# Patient Record
Sex: Male | Born: 1960
Health system: Southern US, Community
[De-identification: ages and names within clinical notes are randomized; demographics above are authoritative.]

## PROBLEM LIST (undated history)

## (undated) DIAGNOSIS — K589 Irritable bowel syndrome without diarrhea: Secondary | ICD-10-CM

## (undated) DIAGNOSIS — K2900 Acute gastritis without bleeding: Secondary | ICD-10-CM

## (undated) DIAGNOSIS — K529 Noninfective gastroenteritis and colitis, unspecified: Secondary | ICD-10-CM

## (undated) DIAGNOSIS — C4490 Unspecified malignant neoplasm of skin, unspecified: Secondary | ICD-10-CM

## (undated) DIAGNOSIS — K219 Gastro-esophageal reflux disease without esophagitis: Secondary | ICD-10-CM

## (undated) DIAGNOSIS — M1990 Unspecified osteoarthritis, unspecified site: Secondary | ICD-10-CM

## (undated) DIAGNOSIS — B1910 Unspecified viral hepatitis B without hepatic coma: Secondary | ICD-10-CM

## (undated) HISTORY — DX: Noninfective gastroenteritis and colitis, unspecified: K52.9

## (undated) HISTORY — DX: Acute gastritis without bleeding: K29.00

## (undated) HISTORY — DX: Irritable bowel syndrome without diarrhea: K58.9

---

## 1987-11-08 DIAGNOSIS — B1910 Unspecified viral hepatitis B without hepatic coma: Secondary | ICD-10-CM

## 1987-11-08 HISTORY — DX: Unspecified viral hepatitis B without hepatic coma: B19.10

## 2011-02-15 HISTORY — PX: COLONOSCOPY: SHX174

## 2011-03-24 ENCOUNTER — Ambulatory Visit: Payer: Self-pay | Admitting: Gastroenterology

## 2011-04-12 ENCOUNTER — Ambulatory Visit: Payer: Self-pay | Admitting: Gastroenterology

## 2011-07-10 ENCOUNTER — Ambulatory Visit: Payer: Self-pay

## 2013-07-31 DIAGNOSIS — K529 Noninfective gastroenteritis and colitis, unspecified: Secondary | ICD-10-CM

## 2013-07-31 HISTORY — DX: Noninfective gastroenteritis and colitis, unspecified: K52.9

## 2013-09-19 DIAGNOSIS — K589 Irritable bowel syndrome without diarrhea: Secondary | ICD-10-CM

## 2013-09-19 HISTORY — DX: Irritable bowel syndrome, unspecified: K58.9

## 2015-02-02 ENCOUNTER — Other Ambulatory Visit: Payer: Self-pay | Admitting: Gastroenterology

## 2015-02-02 DIAGNOSIS — R197 Diarrhea, unspecified: Secondary | ICD-10-CM

## 2015-02-06 ENCOUNTER — Other Ambulatory Visit: Payer: Self-pay

## 2015-02-10 ENCOUNTER — Ambulatory Visit
Admission: RE | Admit: 2015-02-10 | Discharge: 2015-02-10 | Disposition: A | Discharge status: Home / Self Care | Payer: BLUE CROSS/BLUE SHIELD | Source: Ambulatory Visit | Attending: Gastroenterology | Admitting: Gastroenterology

## 2015-02-10 DIAGNOSIS — R197 Diarrhea, unspecified: Secondary | ICD-10-CM

## 2015-07-08 ENCOUNTER — Other Ambulatory Visit: Payer: Self-pay

## 2015-07-09 ENCOUNTER — Ambulatory Visit (INDEPENDENT_AMBULATORY_CARE_PROVIDER_SITE_OTHER): Payer: BLUE CROSS/BLUE SHIELD | Admitting: Gastroenterology

## 2015-07-09 ENCOUNTER — Encounter: Payer: Self-pay | Admitting: Gastroenterology

## 2015-07-09 VITALS — BP 137/88 | HR 78 | Temp 98.0°F | Ht 71.0 in | Wt 266.0 lb

## 2015-07-09 DIAGNOSIS — B1910 Unspecified viral hepatitis B without hepatic coma: Secondary | ICD-10-CM | POA: Insufficient documentation

## 2015-07-09 DIAGNOSIS — B181 Chronic viral hepatitis B without delta-agent: Secondary | ICD-10-CM | POA: Diagnosis not present

## 2015-07-09 NOTE — Progress Notes (Signed)
Gastroenterology Consultation  Referring Provider:     No ref. provider found Primary Care Physician:  No PCP Per Patient Primary Gastroenterologist:  Dr. Allen Norris     Reason for Consultation:     Irritable bowel syndrome        HPI:   Derrick Galvan. is a 54 y.o. y/o male referred for consultation & management of irritable bowel syndrome by Dr. Rayne Du PCP Per Patient.  This patient comes today with a history of irritable bowel syndrome. The patient has had multiple procedures and has seen multiple gastrologist including Dr. Candace Cruise and someone Butner. The patient reports that he has not been given information and is frustrated with his diarrhea. The patient reports that he has 4 bowel movements in the morning before evening goes to work. The patient is afraid to go out a restaurant due to his diarrhea. The patient states that when he gets the urge to go he has to run to the bathroom. He reports always to be loose. The patient was given some samples of Viberzi but had not taken it because he read some of the side effects and was concerned about that and wants to talk to me before taking the medication. The patient denies any bright red blood per rectum or nausea vomiting. He does report that he very infrequently has to wake up and melanotic because of his diarrhea. He does not notice any foods to make the diarrhea any better or worse. He also reports that this has been going on for many years.  Past Medical History  Diagnosis Date  . Chronic diarrhea 07/31/2013  . Adaptive colitis 09/19/2013    Past Surgical History  Procedure Laterality Date  . Colonoscopy  02/15/11    repeat 10 years    Prior to Admission medications   Medication Sig Start Date End Date Taking? Authorizing Provider  bismuth subsalicylate (BISMATROL) 262 MG/15ML suspension Take by mouth.   Yes Historical Provider, MD  loperamide (IMODIUM) 2 MG capsule Take by mouth.   Yes Historical Provider, MD    Family History  Problem  Relation Age of Onset  . COPD Father      Social History  Substance Use Topics  . Smoking status: Never Smoker   . Smokeless tobacco: Never Used  . Alcohol Use: 0.0 oz/week    0 Standard drinks or equivalent per week     Comment: occasional    Allergies as of 07/09/2015  . (No Known Allergies)    Review of Systems:    All systems reviewed and negative except where noted in HPI.   Physical Exam:  BP 137/88 mmHg  Pulse 78  Temp(Src) 98 F (36.7 C) (Oral)  Ht 5\' 11"  (1.803 m)  Wt 266 lb (120.657 kg)  BMI 37.12 kg/m2 No LMP for male patient. Psych:  Alert and cooperative. Normal mood and affect. General:   Alert,  Well-developed, obese, well-nourished, pleasant and cooperative in NAD Head:  Normocephalic and atraumatic. Eyes:  Sclera clear, no icterus.   Conjunctiva pink. Ears:  Normal auditory acuity. Nose:  No deformity, discharge, or lesions. Mouth:  No deformity or lesions,oropharynx pink & moist. Neck:  Supple; no masses or thyromegaly. Lungs:  Respirations even and unlabored.  Clear throughout to auscultation.   No wheezes, crackles, or rhonchi. No acute distress. Heart:  Regular rate and rhythm; no murmurs, clicks, rubs, or gallops. Abdomen:  Normal bowel sounds.  No bruits.  Soft, non-tender and non-distended without masses, hepatosplenomegaly  or hernias noted.  No guarding or rebound tenderness.  Negative Carnett sign.   Rectal:  Deferred.  Msk:  Symmetrical without gross deformities.  Good, equal movement & strength bilaterally. Pulses:  Normal pulses noted. Extremities:  No clubbing or edema.  No cyanosis. Neurologic:  Alert and oriented x3;  grossly normal neurologically. Skin:  Intact without significant lesions or rashes.  No jaundice. Lymph Nodes:  No significant cervical adenopathy. Psych:  Alert and cooperative. Normal mood and affect.  Imaging Studies: No results found.  Assessment and Plan:   Derrick Galvan. is a 54 y.o. y/o male who comes in today  with a history of loose stools and he reports that he has had 2 colonoscopies in the past that did not show any cause for his symptoms. The patient has been explained in depth the physiology of irritable bowel syndrome. He also has been told to stop the Viberzi and see if that helps him. If it does not he may need to start on Lomotil versus a try cyclic antidepressive versus Lotronex. Patient has been explained the plan and agrees with it.   Note: This dictation was prepared with Dragon dictation along with smaller phrase technology. Any transcriptional errors that result from this process are unintentional.

## 2015-11-08 DIAGNOSIS — C439 Malignant melanoma of skin, unspecified: Secondary | ICD-10-CM | POA: Insufficient documentation

## 2015-12-10 ENCOUNTER — Encounter: Payer: Self-pay | Admitting: Gastroenterology

## 2015-12-10 ENCOUNTER — Ambulatory Visit (INDEPENDENT_AMBULATORY_CARE_PROVIDER_SITE_OTHER): Payer: BLUE CROSS/BLUE SHIELD | Admitting: Gastroenterology

## 2015-12-10 VITALS — BP 122/78 | HR 77 | Temp 98.1°F | Ht 71.0 in | Wt 257.0 lb

## 2015-12-10 DIAGNOSIS — K589 Irritable bowel syndrome without diarrhea: Secondary | ICD-10-CM | POA: Diagnosis not present

## 2015-12-10 NOTE — Progress Notes (Signed)
   Primary Care Physician: No PCP Per Patient  Primary Gastroenterologist:  Dr. Lucilla Lame  Chief Complaint  Patient presents with  . Diarrhea    HPI: Derrick Lana. is a 55 y.o. male here for follow-up of his irritable bowel syndrome. The patient reports that he continues to have diarrhea and is now spilling over to his every day activities. The patient states that he did not try any the medications we have talked about in the past such as his samples of Vibrozi. There is no report of any unexplained weight loss. The patient reports that he has the sound of fluid moving around in his abdomen. He also reports that he has abdominal cramping. There is no report of any black stools or bloody stools.  Current Outpatient Prescriptions  Medication Sig Dispense Refill  . bismuth subsalicylate (BISMATROL) 262 MG/15ML suspension Take by mouth.    . loperamide (IMODIUM) 2 MG capsule Take by mouth.     No current facility-administered medications for this visit.    Allergies as of 12/10/2015  . (No Known Allergies)    ROS:  General: Negative for anorexia, weight loss, fever, chills, fatigue, weakness. ENT: Negative for hoarseness, difficulty swallowing , nasal congestion. CV: Negative for chest pain, angina, palpitations, dyspnea on exertion, peripheral edema.  Respiratory: Negative for dyspnea at rest, dyspnea on exertion, cough, sputum, wheezing.  GI: See history of present illness. GU:  Negative for dysuria, hematuria, urinary incontinence, urinary frequency, nocturnal urination.  Endo: Negative for unusual weight change.    Physical Examination:   BP 122/78 mmHg  Pulse 77  Temp(Src) 98.1 F (36.7 C) (Oral)  Ht 5\' 11"  (1.803 m)  Wt 257 lb (116.574 kg)  BMI 35.86 kg/m2  General: Well-nourished, well-developed in no acute distress.  Eyes: No icterus. Conjunctivae pink. Mouth: Oropharyngeal mucosa moist and pink , no lesions erythema or exudate. Lungs: Clear to auscultation  bilaterally. Non-labored. Heart: Regular rate and rhythm, no murmurs rubs or gallops.  Abdomen: Bowel sounds are normal, nontender, nondistended, no hepatosplenomegaly or masses, no abdominal bruits or hernia , no rebound or guarding.   Extremities: No lower extremity edema. No clubbing or deformities. Neuro: Alert and oriented x 3.  Grossly intact. Skin: Warm and dry, no jaundice.   Psych: Alert and cooperative, normal mood and affect.  Labs:    Imaging Studies: No results found.  Assessment and Plan:   Derrick Persad. is a 55 y.o. y/o male Who has a history of irritable bowel syndrome and has not taking any medication for except Pepto-Bismol. The patient has been told to start his samples of Vibrzi. If this does not work he has been told that he may be tried on Lotrenex. The patient has been explained the plan and agrees with it.   Note: This dictation was prepared with Dragon dictation along with smaller phrase technology. Any transcriptional errors that result from this process are unintentional.

## 2016-01-19 ENCOUNTER — Encounter: Payer: Self-pay | Admitting: Gastroenterology

## 2016-01-19 ENCOUNTER — Telehealth: Payer: Self-pay | Admitting: Gastroenterology

## 2016-01-19 NOTE — Telephone Encounter (Signed)
Made patient an appointment for 4/5 with Dr. Allen Galvan but he is having abdominal discomfort. He stopped taking the viberzi because he felt it wasn't helping and just getting over the [place]. He wants to know is there anything he can do between now and his appointment to feel better?

## 2016-01-20 NOTE — Telephone Encounter (Signed)
Spoke with pt regarding his issues. I have also forwarded you a patient advise message from pt as well. He stated you had mentioned to him if the Viberzi didn't work we could prescribe him something else that also aids in depression. Is he referring to Desipramine?

## 2016-01-26 NOTE — Telephone Encounter (Signed)
The patient can be tried on desipramine 25 mg before he goes to bed but what I have told him about was Lotronex.

## 2016-01-27 NOTE — Telephone Encounter (Signed)
Advised pt of Dr. Dorothey Baseman recommendation on both medications. Pt will research both and call back with the one he would like him to prescribe. Advised him Dr. Allen Norris preferred the Lotronex.

## 2016-02-10 ENCOUNTER — Ambulatory Visit: Payer: BLUE CROSS/BLUE SHIELD | Admitting: Gastroenterology

## 2016-07-26 ENCOUNTER — Encounter: Payer: Self-pay | Admitting: Gastroenterology

## 2016-07-26 ENCOUNTER — Other Ambulatory Visit: Payer: Self-pay

## 2016-07-26 ENCOUNTER — Ambulatory Visit (INDEPENDENT_AMBULATORY_CARE_PROVIDER_SITE_OTHER): Payer: BLUE CROSS/BLUE SHIELD | Admitting: Gastroenterology

## 2016-07-26 VITALS — BP 126/86 | HR 81 | Temp 98.4°F | Ht 71.0 in | Wt 269.0 lb

## 2016-07-26 DIAGNOSIS — K219 Gastro-esophageal reflux disease without esophagitis: Secondary | ICD-10-CM | POA: Diagnosis not present

## 2016-07-26 DIAGNOSIS — K580 Irritable bowel syndrome with diarrhea: Secondary | ICD-10-CM | POA: Diagnosis not present

## 2016-07-26 NOTE — Progress Notes (Signed)
   Primary Care Physician: No PCP Per Patient  Primary Gastroenterologist:  Dr. Lucilla Lame  Chief Complaint  Patient presents with  . Colitis Flare  . Abdominal Pain    HPI: Derrick Galvan. is a 55 y.o. male here for follow-up of his irritable bowel syndrome and reflux. The patient reports that he has been having worsening of his crampy pain. The patient reports the pain to be in the left lower quadrant. The patient also states that it is more prevalent than it has been in the past. The patient denies any unexplained weight loss black stools or bloody stools. The patient does report that his stools have been more yellow than usual. The patient also reports that he has been having more GERD symptoms.  Current Outpatient Prescriptions  Medication Sig Dispense Refill  . bismuth subsalicylate (BISMATROL) 262 MG/15ML suspension Take by mouth.    . loperamide (IMODIUM) 2 MG capsule Take by mouth.     No current facility-administered medications for this visit.     Allergies as of 07/26/2016  . (No Known Allergies)    ROS:  General: Negative for anorexia, weight loss, fever, chills, fatigue, weakness. ENT: Negative for hoarseness, difficulty swallowing , nasal congestion. CV: Negative for chest pain, angina, palpitations, dyspnea on exertion, peripheral edema.  Respiratory: Negative for dyspnea at rest, dyspnea on exertion, cough, sputum, wheezing.  GI: See history of present illness. GU:  Negative for dysuria, hematuria, urinary incontinence, urinary frequency, nocturnal urination.  Endo: Negative for unusual weight change.    Physical Examination:   BP 126/86   Pulse 81   Temp 98.4 F (36.9 C) (Oral)   Ht 5\' 11"  (1.803 m)   Wt 269 lb (122 kg)   BMI 37.52 kg/m   General: Well-nourished, well-developed in no acute distress.  Eyes: No icterus. Conjunctivae pink. Mouth: Oropharyngeal mucosa moist and pink , no lesions erythema or exudate. Lungs: Clear to auscultation  bilaterally. Non-labored. Heart: Regular rate and rhythm, no murmurs rubs or gallops.  Abdomen: Bowel sounds are normal, nontender, nondistended, no hepatosplenomegaly or masses, no abdominal bruits or hernia , no rebound or guarding.   Extremities: No lower extremity edema. No clubbing or deformities. Neuro: Alert and oriented x 3.  Grossly intact. Skin: Warm and dry, no jaundice.   Psych: Alert and cooperative, normal mood and affect.  Labs:    Imaging Studies: No results found.  Assessment and Plan:   Derrick Galvan. is a 55 y.o. y/o male who has a history of irritable bowel syndrome who now comes with a history of heartburn. He is not taking any medication for this at this time. The patient also has continued diarrhea that he reports to be yellow in color. The patient will be set up for an EGD and colonoscopy. I have discussed risks & benefits which include, but are not limited to, bleeding, infection, perforation & drug reaction.  The patient agrees with this plan & written consent will be obtained.      Note: This dictation was prepared with Dragon dictation along with smaller phrase technology. Any transcriptional errors that result from this process are unintentional.

## 2016-08-10 ENCOUNTER — Encounter: Payer: Self-pay | Admitting: *Deleted

## 2016-08-12 NOTE — Discharge Instructions (Signed)

## 2016-08-15 ENCOUNTER — Ambulatory Visit
Admission: RE | Admit: 2016-08-15 | Discharge: 2016-08-15 | Disposition: A | Discharge status: Home / Self Care | Payer: BLUE CROSS/BLUE SHIELD | Source: Ambulatory Visit | Attending: Gastroenterology | Admitting: Gastroenterology

## 2016-08-15 ENCOUNTER — Ambulatory Visit: Payer: BLUE CROSS/BLUE SHIELD | Admitting: Anesthesiology

## 2016-08-15 ENCOUNTER — Encounter
Admission: RE | Disposition: A | Discharge status: Home / Self Care | Payer: Self-pay | Source: Ambulatory Visit | Attending: Gastroenterology

## 2016-08-15 ENCOUNTER — Encounter: Payer: Self-pay | Admitting: Anesthesiology

## 2016-08-15 DIAGNOSIS — K210 Gastro-esophageal reflux disease with esophagitis: Secondary | ICD-10-CM

## 2016-08-15 DIAGNOSIS — K2970 Gastritis, unspecified, without bleeding: Secondary | ICD-10-CM | POA: Diagnosis not present

## 2016-08-15 DIAGNOSIS — Z85828 Personal history of other malignant neoplasm of skin: Secondary | ICD-10-CM | POA: Diagnosis not present

## 2016-08-15 DIAGNOSIS — K529 Noninfective gastroenteritis and colitis, unspecified: Secondary | ICD-10-CM | POA: Insufficient documentation

## 2016-08-15 DIAGNOSIS — Z6835 Body mass index (BMI) 35.0-35.9, adult: Secondary | ICD-10-CM | POA: Insufficient documentation

## 2016-08-15 DIAGNOSIS — D122 Benign neoplasm of ascending colon: Secondary | ICD-10-CM | POA: Insufficient documentation

## 2016-08-15 DIAGNOSIS — K641 Second degree hemorrhoids: Secondary | ICD-10-CM | POA: Diagnosis not present

## 2016-08-15 DIAGNOSIS — R12 Heartburn: Secondary | ICD-10-CM

## 2016-08-15 DIAGNOSIS — K2900 Acute gastritis without bleeding: Secondary | ICD-10-CM

## 2016-08-15 HISTORY — DX: Unspecified malignant neoplasm of skin, unspecified: C44.90

## 2016-08-15 HISTORY — PX: POLYPECTOMY: SHX5525

## 2016-08-15 HISTORY — PX: COLONOSCOPY WITH PROPOFOL: SHX5780

## 2016-08-15 HISTORY — DX: Unspecified viral hepatitis B without hepatic coma: B19.10

## 2016-08-15 HISTORY — PX: ESOPHAGOGASTRODUODENOSCOPY (EGD) WITH PROPOFOL: SHX5813

## 2016-08-15 SURGERY — COLONOSCOPY WITH PROPOFOL
Anesthesia: Monitor Anesthesia Care | Wound class: Contaminated

## 2016-08-15 MED ORDER — STERILE WATER FOR IRRIGATION IR SOLN
Status: DC | PRN
  Administered 2016-08-15: 12:00:00

## 2016-08-15 MED ORDER — LIDOCAINE HCL (CARDIAC) 20 MG/ML IV SOLN
INTRAVENOUS | Status: DC | PRN
  Administered 2016-08-15: 40 mg via INTRAVENOUS

## 2016-08-15 MED ORDER — PROPOFOL 10 MG/ML IV BOLUS
INTRAVENOUS | Status: DC | PRN
  Administered 2016-08-15 (×3): 50 mg via INTRAVENOUS
  Administered 2016-08-15: 30 mg via INTRAVENOUS
  Administered 2016-08-15: 50 mg via INTRAVENOUS
  Administered 2016-08-15: 30 mg via INTRAVENOUS
  Administered 2016-08-15: 40 mg via INTRAVENOUS

## 2016-08-15 MED ORDER — LACTATED RINGERS IV SOLN
INTRAVENOUS | Status: DC
  Administered 2016-08-15: 11:00:00 via INTRAVENOUS

## 2016-08-15 MED ORDER — OXYCODONE HCL 5 MG/5ML PO SOLN: 5.0000 mg | Freq: Once | ORAL | Status: DC | PRN

## 2016-08-15 MED ORDER — GLYCOPYRROLATE 0.2 MG/ML IJ SOLN
INTRAMUSCULAR | Status: DC | PRN
  Administered 2016-08-15: 0.2 mg via INTRAVENOUS

## 2016-08-15 MED ORDER — OXYCODONE HCL 5 MG PO TABS: 5.0000 mg | ORAL_TABLET | Freq: Once | ORAL | Status: DC | PRN

## 2016-08-15 SURGICAL SUPPLY — 35 items
BALLN DILATOR 10-12 8 (BALLOONS)
BALLN DILATOR 12-15 8 (BALLOONS)
BALLN DILATOR 15-18 8 (BALLOONS)
BALLN DILATOR CRE 0-12 8 (BALLOONS)
BALLN DILATOR ESOPH 8 10 CRE (MISCELLANEOUS) IMPLANT
BALLOON DILATOR 12-15 8 (BALLOONS) IMPLANT
BALLOON DILATOR 15-18 8 (BALLOONS) IMPLANT
BALLOON DILATOR CRE 0-12 8 (BALLOONS) IMPLANT
BLOCK BITE 60FR ADLT L/F GRN (MISCELLANEOUS) ×3 IMPLANT
CANISTER SUCT 1200ML W/VALVE (MISCELLANEOUS) ×3 IMPLANT
CLIP HMST 235XBRD CATH ROT (MISCELLANEOUS) IMPLANT
CLIP RESOLUTION 360 11X235 (MISCELLANEOUS)
FCP ESCP3.2XJMB 240X2.8X (MISCELLANEOUS)
FORCEPS BIOP RAD 4 LRG CAP 4 (CUTTING FORCEPS) ×3 IMPLANT
FORCEPS BIOP RJ4 240 W/NDL (MISCELLANEOUS)
FORCEPS ESCP3.2XJMB 240X2.8X (MISCELLANEOUS) IMPLANT
GOWN CVR UNV OPN BCK APRN NK (MISCELLANEOUS) ×4 IMPLANT
GOWN ISOL THUMB LOOP REG UNIV (MISCELLANEOUS) ×2
INJECTOR VARIJECT VIN23 (MISCELLANEOUS) IMPLANT
KIT DEFENDO VALVE AND CONN (KITS) IMPLANT
KIT ENDO PROCEDURE OLY (KITS) ×3 IMPLANT
MARKER SPOT ENDO TATTOO 5ML (MISCELLANEOUS) IMPLANT
PAD GROUND ADULT SPLIT (MISCELLANEOUS) IMPLANT
PROBE APC STR FIRE (PROBE) IMPLANT
RETRIEVER NET PLAT FOOD (MISCELLANEOUS) IMPLANT
RETRIEVER NET ROTH 2.5X230 LF (MISCELLANEOUS) IMPLANT
SNARE SHORT THROW 13M SML OVAL (MISCELLANEOUS) IMPLANT
SNARE SHORT THROW 30M LRG OVAL (MISCELLANEOUS) IMPLANT
SNARE SNG USE RND 15MM (INSTRUMENTS) IMPLANT
SPOT EX ENDOSCOPIC TATTOO (MISCELLANEOUS)
SYR INFLATION 60ML (SYRINGE) IMPLANT
TRAP ETRAP POLY (MISCELLANEOUS) IMPLANT
VARIJECT INJECTOR VIN23 (MISCELLANEOUS)
WATER STERILE IRR 250ML POUR (IV SOLUTION) ×3 IMPLANT
WIRE CRE 18-20MM 8CM F G (MISCELLANEOUS) IMPLANT

## 2016-08-15 NOTE — Anesthesia Preprocedure Evaluation (Signed)
Anesthesia Evaluation  Patient identified by MRN, date of birth, ID band  Reviewed: NPO status   History of Anesthesia Complications Negative for: history of anesthetic complications  Airway Mallampati: II  TM Distance: >3 FB Neck ROM: full   Comment: +beard; Dental no notable dental hx.    Pulmonary neg pulmonary ROS,    Pulmonary exam normal        Cardiovascular Exercise Tolerance: Good negative cardio ROS Normal cardiovascular exam     Neuro/Psych negative neurological ROS  negative psych ROS   GI/Hepatic negative GI ROS, (+) Hepatitis -, B  Endo/Other  Morbid obesity (bmi=35)  Renal/GU negative Renal ROS  negative genitourinary   Musculoskeletal   Abdominal   Peds  Hematology negative hematology ROS (+)   Anesthesia Other Findings   Reproductive/Obstetrics                             Anesthesia Physical Anesthesia Plan  ASA: II  Anesthesia Plan: MAC   Post-op Pain Management:    Induction:   Airway Management Planned:   Additional Equipment:   Intra-op Plan:   Post-operative Plan:   Informed Consent: I have reviewed the patients History and Physical, chart, labs and discussed the procedure including the risks, benefits and alternatives for the proposed anesthesia with the patient or authorized representative who has indicated his/her understanding and acceptance.     Plan Discussed with: CRNA  Anesthesia Plan Comments:         Anesthesia Quick Evaluation

## 2016-08-15 NOTE — Op Note (Signed)
[facility] Gastroenterology Patient Name: Derrick Galvan Procedure Date: 08/15/2016 11:07 AM MRN: [id number] Account #: [id number] Date of Birth: Mar 11, 1961 Admit Type: Outpatient Age: 55 Room: [facility] OR ROOM 01 Gender: Male Note Status: Finalized Procedure:            Colonoscopy Indications:          Chronic diarrhea Providers:            Lucilla Lame MD, MD Medicines:            Propofol per Anesthesia Complications:        No immediate complications. Procedure:            Pre-Anesthesia Assessment:                       - Prior to the procedure, a History and Physical was                        performed, and patient medications and allergies were                        reviewed. The patient's tolerance of previous                        anesthesia was also reviewed. The risks and benefits of                        the procedure and the sedation options and risks were                        discussed with the patient. All questions were                        answered, and informed consent was obtained. Prior                        Anticoagulants: The patient has taken no previous                        anticoagulant or antiplatelet agents. ASA Grade                        Assessment: II - A patient with mild systemic disease.                        After reviewing the risks and benefits, the patient was                        deemed in satisfactory condition to undergo the                        procedure.                       After obtaining informed consent, the colonoscope was                        passed under direct vision. Throughout the procedure,                        the patient's blood pressure, pulse, and oxygen  saturations were monitored continuously. The was                        introduced through the anus and advanced to the the                        terminal ileum. The colonoscopy was performed without       difficulty. The patient tolerated the procedure well.                        The quality of the bowel preparation was excellent. Findings:      The perianal and digital rectal examinations were normal.      The terminal ileum appeared normal. Biopsies were taken with a cold       forceps for histology.      A 3 mm polyp was found in the ascending colon. The polyp was sessile.       The polyp was removed with a cold biopsy forceps. Resection and       retrieval were complete.      Random biopsies were obtained with cold forceps for histology randomly       in the entire colon.      Non-bleeding internal hemorrhoids were found during retroflexion. The       hemorrhoids were Grade II (internal hemorrhoids that prolapse but reduce       spontaneously). Impression:           - The examined portion of the ileum was normal.                        Biopsied.                       - One 3 mm polyp in the ascending colon, removed with a                        cold biopsy forceps. Resected and retrieved.                       - Non-bleeding internal hemorrhoids.                       - Random biopsies were obtained in the entire colon. Recommendation:       - Await pathology results. Procedure Code(s):    --- Professional ---                       [phone], Colonoscopy, flexible; with biopsy, single or                        multiple Diagnosis Code(s):    --- Professional ---                       K52.9, Noninfective gastroenteritis and colitis,                        unspecified                       D12.2, Benign neoplasm of ascending colon CPT copyright 2016 American Medical Association. All rights reserved. The codes documented in this report are preliminary and upon coder  review may  be revised to meet current compliance requirements. Lucilla Lame MD, MD 08/15/2016 11:45:57 AM This report has been signed electronically. Number of Addenda: 0 Note Initiated On: 08/15/2016 11:07 AM Scope  Withdrawal Time: 0 hours 7 minutes 45 seconds  Total Procedure Duration: 0 hours 9 minutes 32 seconds       [facility]

## 2016-08-15 NOTE — Transfer of Care (Signed)
Immediate Anesthesia Transfer of Care Note  Patient: Derrick Galvan.  Procedure(s) Performed: Procedure(s): COLONOSCOPY WITH PROPOFOL (N/A) ESOPHAGOGASTRODUODENOSCOPY (EGD) WITH PROPOFOL (N/A) POLYPECTOMY  Patient Location: PACU  Anesthesia Type: MAC  Level of Consciousness: awake, alert  and patient cooperative  Airway and Oxygen Therapy: Patient Spontanous Breathing and Patient connected to supplemental oxygen  Post-op Assessment: Post-op Vital signs reviewed, Patient's Cardiovascular Status Stable, Respiratory Function Stable, Patent Airway and No signs of Nausea or vomiting  Post-op Vital Signs: Reviewed and stable  Complications: No apparent anesthesia complications

## 2016-08-15 NOTE — Op Note (Signed)
[facility] Gastroenterology Patient Name: Derrick Galvan Procedure Date: 08/15/2016 11:08 AM MRN: [id number] Account #: [id number] Date of Birth: 10-May-1961 Admit Type: Outpatient Age: 55 Room: [facility] OR ROOM 01 Gender: Male Note Status: Finalized Procedure:            Upper GI endoscopy Indications:          Heartburn, Diarrhea Providers:            Lucilla Lame MD, MD Medicines:            Propofol per Anesthesia Complications:        No immediate complications. Procedure:            Pre-Anesthesia Assessment:                       - Prior to the procedure, a History and Physical was                        performed, and patient medications and allergies were                        reviewed. The patient's tolerance of previous                        anesthesia was also reviewed. The risks and benefits of                        the procedure and the sedation options and risks were                        discussed with the patient. All questions were                        answered, and informed consent was obtained. Prior                        Anticoagulants: The patient has taken no previous                        anticoagulant or antiplatelet agents. ASA Grade                        Assessment: II - A patient with mild systemic disease.                        After reviewing the risks and benefits, the patient was                        deemed in satisfactory condition to undergo the                        procedure.                       After obtaining informed consent, the endoscope was                        passed under direct vision. Throughout the procedure,                        the patient's blood pressure, pulse,  and oxygen                        saturations were monitored continuously. The Olympus                        GIF H180J endoscope (S#: [id number]) was introduced                        through the mouth, and advanced to the second part of                duodenum. The upper GI endoscopy was accomplished                        without difficulty. The patient tolerated the procedure                        well. Findings:      LA Grade A (one or more mucosal breaks less than 5 mm, not extending       between tops of 2 mucosal folds) esophagitis with no bleeding was found       in the lower third of the esophagus.      Localized mild inflammation characterized by erythema was found in the       gastric antrum. Biopsies were taken with a cold forceps for histology.      The examined duodenum was normal. Biopsies were taken with a cold       forceps for histology. Impression:           - LA Grade A reflux esophagitis.                       - Gastritis. Biopsied.                       - Normal examined duodenum. Biopsied. Recommendation:       - Discharge patient to home.                       - Resume previous diet.                       - Continue present medications.                       - Await pathology results.                       - Use a proton pump inhibitor PO daily. Procedure Code(s):    --- Professional ---                       [phone], Esophagogastroduodenoscopy, flexible, transoral;                        with biopsy, single or multiple Diagnosis Code(s):    --- Professional ---                       R12, Heartburn                       R19.7, Diarrhea, unspecified  K29.70, Gastritis, unspecified, without bleeding                       K21.0, Gastro-esophageal reflux disease with esophagitis CPT copyright 2016 American Medical Association. All rights reserved. The codes documented in this report are preliminary and upon coder review may  be revised to meet current compliance requirements. Lucilla Lame MD, MD 08/15/2016 11:30:35 AM This report has been signed electronically. Number of Addenda: 0 Note Initiated On: 08/15/2016 11:08 AM Total Procedure Duration: 0 hours 2 minutes 51 seconds        [facility]

## 2016-08-15 NOTE — Anesthesia Postprocedure Evaluation (Signed)
Anesthesia Post Note  Patient: Derrick Galvan.  Procedure(s) Performed: Procedure(s) (LRB): COLONOSCOPY WITH PROPOFOL (N/A) ESOPHAGOGASTRODUODENOSCOPY (EGD) WITH PROPOFOL (N/A) POLYPECTOMY  Patient location during evaluation: PACU Anesthesia Type: MAC Level of consciousness: awake and alert Pain management: pain level controlled Vital Signs Assessment: post-procedure vital signs reviewed and stable Respiratory status: spontaneous breathing, nonlabored ventilation, respiratory function stable and patient connected to nasal cannula oxygen Cardiovascular status: stable and blood pressure returned to baseline Anesthetic complications: no    Onedia Vargus

## 2016-08-15 NOTE — H&P (Signed)
  Derrick Lame, MD [address] [place], [place] [address] Phone: [phone] Fax : [phone]  Primary Care Physician:  No PCP Per Patient Primary Gastroenterologist:  Dr. Allen Norris  Pre-Procedure History & Physical: HPI:  Derrick Izer. is a 55 y.o. male is here for an endoscopy and colonoscopy.   Past Medical History:  Diagnosis Date  . Adaptive colitis 09/19/2013  . Chronic diarrhea 07/31/2013  . Hepatitis B 1989   resolved  . Skin cancer    on face - removed under local    Past Surgical History:  Procedure Laterality Date  . COLONOSCOPY  02/15/11   repeat 10 years    Prior to Admission medications   Medication Sig Start Date End Date Taking? Authorizing Provider  bismuth subsalicylate (BISMATROL) 262 MG/15ML suspension Take by mouth.   Yes Historical Provider, MD  loperamide (IMODIUM) 2 MG capsule Take by mouth.   Yes Historical Provider, MD    Allergies as of 07/26/2016  . (No Known Allergies)    Family History  Problem Relation Age of Onset  . COPD Father     Social History   Social History  . Marital status: Single    Spouse name: N/A  . Number of children: N/A  . Years of education: N/A   Occupational History  . Not on file.   Social History Main Topics  . Smoking status: Never Smoker  . Smokeless tobacco: Never Used  . Alcohol use 0.0 oz/week     Comment: occasional  . Drug use: No  . Sexual activity: Not on file   Other Topics Concern  . Not on file   Social History Narrative  . No narrative on file    Review of Systems: See HPI, otherwise negative ROS  Physical Exam: BP 129/84   Pulse 87   Temp 98.7 F (37.1 C) (Temporal)   Resp 16   Ht 5\' 11"  (1.803 m)   Wt 252 lb (114.3 kg)   SpO2 96%   BMI 35.15 kg/m  General:   Alert,  pleasant and cooperative in NAD Head:  Normocephalic and atraumatic. Neck:  Supple; no masses or thyromegaly. Lungs:  Clear throughout to auscultation.    Heart:  Regular rate and  rhythm. Abdomen:  Soft, nontender and nondistended. Normal bowel sounds, without guarding, and without rebound.   Neurologic:  Alert and  oriented x4;  grossly normal neurologically.  Impression/Plan: Derrick Number. is here for an endoscopy and colonoscopy to be performed for Heartburn an d Diarrhea   Risks, benefits, limitations, and alternatives regarding  endoscopy and colonoscopy have been reviewed with the patient.  Questions have been answered.  All parties agreeable.   Derrick Lame, MD  08/15/2016, 11:10 AM

## 2016-08-16 ENCOUNTER — Encounter: Payer: Self-pay | Admitting: Gastroenterology

## 2016-08-17 ENCOUNTER — Encounter: Payer: Self-pay | Admitting: Gastroenterology

## 2016-08-31 ENCOUNTER — Ambulatory Visit: Payer: Self-pay | Admitting: Gastroenterology

## 2016-09-06 ENCOUNTER — Ambulatory Visit: Payer: Self-pay | Admitting: Gastroenterology

## 2016-09-22 ENCOUNTER — Ambulatory Visit: Payer: Self-pay | Admitting: Gastroenterology

## 2016-10-05 ENCOUNTER — Other Ambulatory Visit: Payer: Self-pay

## 2016-10-05 ENCOUNTER — Ambulatory Visit (INDEPENDENT_AMBULATORY_CARE_PROVIDER_SITE_OTHER): Payer: BLUE CROSS/BLUE SHIELD | Admitting: Gastroenterology

## 2016-10-05 ENCOUNTER — Encounter: Payer: Self-pay | Admitting: Gastroenterology

## 2016-10-05 VITALS — BP 133/79 | HR 80 | Temp 98.1°F | Ht 71.0 in | Wt 263.0 lb

## 2016-10-05 DIAGNOSIS — K580 Irritable bowel syndrome with diarrhea: Secondary | ICD-10-CM | POA: Diagnosis not present

## 2016-10-05 MED ORDER — DIPHENOXYLATE-ATROPINE 2.5-0.025 MG PO TABS: 1.0000 | ORAL_TABLET | Freq: Four times a day (QID) | ORAL | 5 refills | Status: DC | PRN

## 2016-10-05 NOTE — Progress Notes (Signed)
   Primary Care Physician: No PCP Per Patient  Primary Gastroenterologist:  Dr. Lucilla Lame  Chief Complaint  Patient presents with  . Follow up EGD results    HPI: Derrick Galvan. is a 55 y.o. male here for follow-up after having an EGD and colonoscopy. The patient had a polyp in his colon that was an adenoma and the patient was told to need a repeat colonoscopy in 5 years. The patient also had biopsies of the terminal ileum and random colon biopsies due to his diarrhea. The patient also had biopsies of his small bowel in the duodenum for possible celiac sprue which was also negative. The patient states that he wakes up in the middle of night usually urinate and will have abdominal cramping and a lot of abdominal noise. The patient also states that he has worsening symptoms when he wakes up first thing in the morning. The patient has not had any unexplained weight loss, fevers, black stools or bloody stools. The patient was tried on Viberzi reports that it gave him a lot of cramping.  Current Outpatient Prescriptions  Medication Sig Dispense Refill  . bismuth subsalicylate (BISMATROL) 262 MG/15ML suspension Take by mouth.    . loperamide (IMODIUM) 2 MG capsule Take by mouth.     No current facility-administered medications for this visit.     Allergies as of 10/05/2016  . (No Known Allergies)    ROS:  General: Negative for anorexia, weight loss, fever, chills, fatigue, weakness. ENT: Negative for hoarseness, difficulty swallowing , nasal congestion. CV: Negative for chest pain, angina, palpitations, dyspnea on exertion, peripheral edema.  Respiratory: Negative for dyspnea at rest, dyspnea on exertion, cough, sputum, wheezing.  GI: See history of present illness. GU:  Negative for dysuria, hematuria, urinary incontinence, urinary frequency, nocturnal urination.  Endo: Negative for unusual weight change.    Physical Examination:   BP 133/79   Pulse 80   Temp 98.1 F (36.7 C)  (Oral)   Ht 5\' 11"  (1.803 m)   Wt 263 lb (119.3 kg)   BMI 36.68 kg/m   General: Well-nourished, well-developed in no acute distress.  Eyes: No icterus. Conjunctivae pink. Neuro: Alert and oriented x 3.  Grossly intact. Skin: Warm and dry, no jaundice.   Psych: Alert and cooperative, normal mood and affect.  Labs:    Imaging Studies: No results found.  Assessment and Plan:   Derrick Galvan. is a 55 y.o. y/o male who has had a EGD and colonoscopy with no other source of his diarrhea found except for his irritable bowel syndrome. The patient had a tubular adenoma that was removed and he needs a repeat colonoscopy in 5 years. The patient also will be started on Lomotil for his diarrhea and abdominal discomfort. The patient has been explained the plan and agrees with it.    Lucilla Lame, MD. Marval Regal   Note: This dictation was prepared with Dragon dictation along with smaller phrase technology. Any transcriptional errors that result from this process are unintentional.

## 2017-03-28 ENCOUNTER — Telehealth: Payer: Self-pay | Admitting: Gastroenterology

## 2017-03-28 NOTE — Telephone Encounter (Signed)
LVM for pt to return my call.

## 2017-03-28 NOTE — Telephone Encounter (Signed)
Patient left a voice message regarding medication. Please call

## 2017-03-29 ENCOUNTER — Other Ambulatory Visit: Payer: Self-pay

## 2017-03-29 MED ORDER — DICYCLOMINE HCL 20 MG PO TABS: 20.0000 mg | ORAL_TABLET | Freq: Three times a day (TID) | ORAL | 3 refills | Status: DC

## 2017-03-29 NOTE — Telephone Encounter (Signed)
Spoke with pt regarding the medication, Bentyl. Pt stated he has a friend with the same symptoms and he takes Bentyl for his cramping. Pt requested a trial to see if this will help him as well. Per Dr. Allen Norris, ok to send to pt's pharmacy to try.

## 2017-03-29 NOTE — Telephone Encounter (Signed)
Patient missed your call yesterday but really needs to speak to you today.

## 2017-04-24 ENCOUNTER — Telehealth: Payer: Self-pay | Admitting: Gastroenterology

## 2017-04-24 NOTE — Telephone Encounter (Signed)
*  STAT* If patient is at the pharmacy, call can be transferred to refill team.   1. Which medications need to be refilled? (please list name of each medication and dose if known) loperamide (IMODIUM) 2 MG capsule  2. Which pharmacy/location (including street and city if local pharmacy) is medication to be sent to? [facility] [place]   3. Do they need a 30 day or 90 day supply? 90 day

## 2017-04-25 ENCOUNTER — Other Ambulatory Visit: Payer: Self-pay

## 2017-04-25 DIAGNOSIS — K2100 Gastro-esophageal reflux disease with esophagitis, without bleeding: Secondary | ICD-10-CM

## 2017-04-25 MED ORDER — LOPERAMIDE HCL 2 MG PO CAPS: 2.0000 mg | ORAL_CAPSULE | Freq: Once | ORAL | 3 refills | Status: AC

## 2017-04-28 ENCOUNTER — Other Ambulatory Visit: Payer: Self-pay

## 2017-04-28 DIAGNOSIS — K529 Noninfective gastroenteritis and colitis, unspecified: Secondary | ICD-10-CM

## 2017-04-28 MED ORDER — DIPHENOXYLATE-ATROPINE 2.5-0.025 MG PO TABS: 1.0000 | ORAL_TABLET | Freq: Four times a day (QID) | ORAL | 5 refills | Status: DC | PRN

## 2017-05-25 ENCOUNTER — Telehealth: Payer: Self-pay | Admitting: Gastroenterology

## 2017-05-25 ENCOUNTER — Other Ambulatory Visit: Payer: Self-pay

## 2017-05-25 MED ORDER — DICYCLOMINE HCL 20 MG PO TABS: 20.0000 mg | ORAL_TABLET | Freq: Three times a day (TID) | ORAL | 3 refills | Status: DC

## 2017-05-25 NOTE — Telephone Encounter (Signed)
*  STAT* If patient is at the pharmacy, call can be transferred to refill team.   1. Which medications need to be refilled? (please list name of each medication and dose if known) Dicyclomine 20mg    2. Which pharmacy/location (including street and city if local pharmacy) is medication to be sent to? [facility] [place]  3. Do they need a 30 day or 90 day supply? 90 day

## 2017-06-21 ENCOUNTER — Telehealth: Payer: Self-pay | Admitting: Gastroenterology

## 2017-06-21 NOTE — Telephone Encounter (Signed)
Patient left a voice message that he needs a refill on his Lamotil. He would like a call back to know you did this. [phone]

## 2017-06-22 ENCOUNTER — Other Ambulatory Visit: Payer: Self-pay | Admitting: Gastroenterology

## 2017-06-22 NOTE — Telephone Encounter (Signed)
*  STAT* If patient is at the pharmacy, call can be transferred to refill team.   1. Which medications need to be refilled? (please list name of each medication and dose if known) Lomotil 2.5  2. Which pharmacy/location (including street and city if local pharmacy) is medication to be sent to? [place]  3. Do they need a 30 day or 90 day supply?90 day

## 2017-06-23 ENCOUNTER — Other Ambulatory Visit: Payer: Self-pay

## 2017-06-23 DIAGNOSIS — K529 Noninfective gastroenteritis and colitis, unspecified: Secondary | ICD-10-CM

## 2017-06-23 MED ORDER — DIPHENOXYLATE-ATROPINE 2.5-0.025 MG PO TABS: 1.0000 | ORAL_TABLET | Freq: Four times a day (QID) | ORAL | 5 refills | Status: DC | PRN

## 2017-06-23 NOTE — Telephone Encounter (Signed)
Rx for Lomotil sent to pharmacy per pt request.

## 2017-06-26 ENCOUNTER — Telehealth: Payer: Self-pay | Admitting: Gastroenterology

## 2017-06-26 NOTE — Telephone Encounter (Signed)
Spoke with [facility] pharmacy regarding Lomotil. They stated it was called in as [name], Derrick Galvan is pt's middle name and the one he goes by. This is what his chart states. They had put it on hold as they didn't have his information in the system. After I explained the issue, the pharmacist  added the Rx to the correct name that is listed on his insurance. Pt notified that when he picks this up, he needs to tell them Derrick Galvan, Derrick Galvan.

## 2017-06-26 NOTE — Telephone Encounter (Signed)
Patient left a voice message that the wrong RX was called in. He needs the Lamotal called in ASAP Please call patient [phone]

## 2017-08-11 ENCOUNTER — Other Ambulatory Visit: Payer: Self-pay | Admitting: Gastroenterology

## 2017-08-11 NOTE — Telephone Encounter (Signed)
Contacted pt and advised him he still has available refills at the pharmacy. Contacted pt and advised him these are available.

## 2017-08-11 NOTE — Telephone Encounter (Signed)
*  STAT* If patient is at the pharmacy, call can be transferred to refill team.   1. Which medications need to be refilled? (please list name of each medication and dose if known)dicyclomine 20 mg and diphenoxlate 2.5  2. Which pharmacy/location (including street and city if local pharmacy) is medication to be sent to? [facility]  3. Do they need a 30 day or 90 day supply? 90 day

## 2017-08-14 ENCOUNTER — Other Ambulatory Visit: Payer: Self-pay

## 2017-08-14 ENCOUNTER — Telehealth: Payer: Self-pay | Admitting: Gastroenterology

## 2017-08-14 MED ORDER — DICYCLOMINE HCL 20 MG PO TABS: 20.0000 mg | ORAL_TABLET | Freq: Three times a day (TID) | ORAL | 6 refills | Status: DC

## 2017-08-14 NOTE — Telephone Encounter (Signed)
Dicyclomine didn't go through. I put in his new insurance. Can you do it again

## 2017-08-14 NOTE — Telephone Encounter (Signed)
Rx called into pt's pharmacy per his request. Pt notified.

## 2017-08-30 ENCOUNTER — Telehealth: Payer: Self-pay | Admitting: Gastroenterology

## 2017-08-30 NOTE — Telephone Encounter (Signed)
LVM for pt to return my call.

## 2017-08-30 NOTE — Telephone Encounter (Signed)
Patient called and stated he is having some other symptoms and really needs to speak to you to make sure it nothing serious. Please call today

## 2017-08-31 ENCOUNTER — Ambulatory Visit (INDEPENDENT_AMBULATORY_CARE_PROVIDER_SITE_OTHER): Payer: BLUE CROSS/BLUE SHIELD | Admitting: Gastroenterology

## 2017-08-31 ENCOUNTER — Encounter: Payer: Self-pay | Admitting: Gastroenterology

## 2017-08-31 ENCOUNTER — Telehealth: Payer: Self-pay | Admitting: Gastroenterology

## 2017-08-31 VITALS — BP 135/78 | HR 77 | Temp 97.9°F | Ht 71.0 in | Wt 270.0 lb

## 2017-08-31 DIAGNOSIS — R197 Diarrhea, unspecified: Secondary | ICD-10-CM

## 2017-08-31 NOTE — Telephone Encounter (Signed)
Patient has some medication questions to ask. Please call back asap

## 2017-08-31 NOTE — Telephone Encounter (Signed)
Yes he was told that we can try dicyclomine before bed but let him see if this nonprescription samples I gave him work first if not then we can start him on 25 mg of desipramine before bedtime.

## 2017-08-31 NOTE — Telephone Encounter (Signed)
Pt thinks there was a medication you wanted him to take at bedtime but you brought him the IBGard. Was there another you were going prescribe?

## 2017-08-31 NOTE — Telephone Encounter (Signed)
Pt returned call and we have scheduled a follow up appt with Dr. Allen Norris today, 08/31/17.

## 2017-08-31 NOTE — Progress Notes (Signed)
    Primary Care Physician: Patient, No Pcp Per  Primary Gastroenterologist:  Dr. Lucilla Lame  Chief Complaint  Patient presents with  . Diarrhea    HPI: Derrick Galvan. is a 56 y.o. male here for follow-up of his long-standing irritable bowel syndrome.  The patient states his Lomotil is helping him out but he only takes it once a day.  The patient also reports that his main concern right now is that he has a large amount of noise coming from his abdomen when he sleeps and also when he is in meetings.  Current Outpatient Prescriptions  Medication Sig Dispense Refill  . bismuth subsalicylate (BISMATROL) 262 MG/15ML suspension Take by mouth.    . dicyclomine (BENTYL) 20 MG tablet Take 1 tablet (20 mg total) by mouth 3 (three) times daily before meals. 90 tablet 6  . diphenoxylate-atropine (LOMOTIL) 2.5-0.025 MG tablet Take 1 tablet by mouth 4 (four) times daily as needed for diarrhea or loose stools. 60 tablet 5   No current facility-administered medications for this visit.     Allergies as of 08/31/2017  . (No Known Allergies)    ROS:  General: Negative for anorexia, weight loss, fever, chills, fatigue, weakness. ENT: Negative for hoarseness, difficulty swallowing , nasal congestion. CV: Negative for chest pain, angina, palpitations, dyspnea on exertion, peripheral edema.  Respiratory: Negative for dyspnea at rest, dyspnea on exertion, cough, sputum, wheezing.  GI: See history of present illness. GU:  Negative for dysuria, hematuria, urinary incontinence, urinary frequency, nocturnal urination.  Endo: Negative for unusual weight change.    Physical Examination:   BP 135/78   Pulse 77   Temp 97.9 F (36.6 C) (Oral)   Ht 5\' 11"  (1.803 m)   Wt 270 lb (122.5 kg)   BMI 37.66 kg/m   General: Well-nourished, well-developed in no acute distress.  Eyes: No icterus. Conjunctivae pink. Extremities: No lower extremity edema. No clubbing or deformities. Neuro: Alert and oriented x  3.  Grossly intact. Skin: Warm and dry, no jaundice.   Psych: Alert and cooperative, normal mood and affect.  Labs:    Imaging Studies: No results found.  Assessment and Plan:   Derrick Galvan. is a 56 y.o. y/o male Who has a history of earlobe bowel syndrome and is concerned about the sounds coming out of his abdomen.  The patient has gained weight since I last saw him and the Lomotil is helping some of his symptoms.  The patient has been told to try and avoid drinking large amounts of fluid before going to bed or before his meetings at work.  The patient has also been told to increase fiber in his diet.  I will send off blood work for a CMP and CBC since he has not had any blood work done recently.  The patient has been explained the plan and agrees with it.    Lucilla Lame, MD. Marval Regal   Note: This dictation was prepared with Dragon dictation along with smaller phrase technology. Any transcriptional errors that result from this process are unintentional.

## 2017-09-01 LAB — COMPREHENSIVE METABOLIC PANEL
ALT: 21 IU/L (ref 0–44)
AST: 22 IU/L (ref 0–40)
Albumin/Globulin Ratio: 1.9 (ref 1.2–2.2)
Albumin: 4.3 g/dL (ref 3.5–5.5)
Alkaline Phosphatase: 68 IU/L (ref 39–117)
BUN/Creatinine Ratio: 17 (ref 9–20)
BUN: 18 mg/dL (ref 6–24)
Bilirubin Total: 1.2 mg/dL (ref 0.0–1.2)
CO2: 25 mmol/L (ref 20–29)
Calcium: 9.2 mg/dL (ref 8.7–10.2)
Chloride: 104 mmol/L (ref 96–106)
Creatinine, Ser: 1.08 mg/dL (ref 0.76–1.27)
GFR calc Af Amer: 89 mL/min/{1.73_m2} (ref >59)
GFR calc non Af Amer: 77 mL/min/{1.73_m2} (ref >59)
Globulin, Total: 2.3 g/dL (ref 1.5–4.5)
Glucose: 78 mg/dL (ref 65–99)
Potassium: 4.5 mmol/L (ref 3.5–5.2)
Sodium: 143 mmol/L (ref 134–144)
Total Protein: 6.6 g/dL (ref 6.0–8.5)

## 2017-09-01 LAB — CBC WITH DIFFERENTIAL/PLATELET
Basophils Absolute: 0.1 10*3/uL (ref 0.0–0.2)
Basos: 1 %
EOS (ABSOLUTE): 0.2 10*3/uL (ref 0.0–0.4)
Eos: 2 %
Hematocrit: 49 % (ref 37.5–51.0)
Hemoglobin: 16.7 g/dL (ref 13.0–17.7)
Immature Grans (Abs): 0 10*3/uL (ref 0.0–0.1)
Immature Granulocytes: 0 %
Lymphocytes Absolute: 3 10*3/uL (ref 0.7–3.1)
Lymphs: 34 %
MCH: 30.6 pg (ref 26.6–33.0)
MCHC: 34.1 g/dL (ref 31.5–35.7)
MCV: 90 fL (ref 79–97)
Monocytes Absolute: 0.6 10*3/uL (ref 0.1–0.9)
Monocytes: 7 %
Neutrophils Absolute: 4.9 10*3/uL (ref 1.4–7.0)
Neutrophils: 56 %
Platelets: 207 10*3/uL (ref 150–379)
RBC: 5.46 x10E6/uL (ref 4.14–5.80)
RDW: 13.3 % (ref 12.3–15.4)
WBC: 8.8 10*3/uL (ref 3.4–10.8)

## 2017-09-01 NOTE — Telephone Encounter (Signed)
Left message with Dr. Dorothey Baseman recommendation about taking the samples first.

## 2017-09-08 ENCOUNTER — Telehealth: Payer: Self-pay

## 2017-09-08 NOTE — Telephone Encounter (Signed)
-----   Message from Lucilla Lame, MD sent at 09/04/2017 10:36 AM EDT ----- Let the patient knows that all of his labs were absolutely normal.

## 2017-09-08 NOTE — Telephone Encounter (Signed)
Pt notified of lab results

## 2018-10-27 ENCOUNTER — Ambulatory Visit (INDEPENDENT_AMBULATORY_CARE_PROVIDER_SITE_OTHER): Payer: Self-pay

## 2018-10-27 ENCOUNTER — Ambulatory Visit
Admission: EM | Admit: 2018-10-27 | Discharge: 2018-10-27 | Disposition: A | Discharge status: Home / Self Care | Payer: Self-pay | Attending: Emergency Medicine | Admitting: Emergency Medicine

## 2018-10-27 ENCOUNTER — Other Ambulatory Visit: Payer: Self-pay

## 2018-10-27 DIAGNOSIS — Z836 Family history of other diseases of the respiratory system: Secondary | ICD-10-CM | POA: Insufficient documentation

## 2018-10-27 DIAGNOSIS — J189 Pneumonia, unspecified organism: Secondary | ICD-10-CM | POA: Insufficient documentation

## 2018-10-27 DIAGNOSIS — Z79899 Other long term (current) drug therapy: Secondary | ICD-10-CM | POA: Insufficient documentation

## 2018-10-27 DIAGNOSIS — Z9889 Other specified postprocedural states: Secondary | ICD-10-CM | POA: Insufficient documentation

## 2018-10-27 DIAGNOSIS — Z85828 Personal history of other malignant neoplasm of skin: Secondary | ICD-10-CM | POA: Insufficient documentation

## 2018-10-27 DIAGNOSIS — R0781 Pleurodynia: Secondary | ICD-10-CM

## 2018-10-27 DIAGNOSIS — J90 Pleural effusion, not elsewhere classified: Secondary | ICD-10-CM | POA: Insufficient documentation

## 2018-10-27 DIAGNOSIS — J181 Lobar pneumonia, unspecified organism: Secondary | ICD-10-CM

## 2018-10-27 MED ORDER — IBUPROFEN 600 MG PO TABS: 600.0000 mg | ORAL_TABLET | Freq: Four times a day (QID) | ORAL | 0 refills | Status: DC | PRN

## 2018-10-27 MED ORDER — HYDROCODONE-ACETAMINOPHEN 5-325 MG PO TABS: 1.0000 | ORAL_TABLET | Freq: Four times a day (QID) | ORAL | 0 refills | Status: DC | PRN

## 2018-10-27 MED ORDER — AZITHROMYCIN 250 MG PO TABS: 250.0000 mg | ORAL_TABLET | Freq: Every day | ORAL | 0 refills | Status: DC

## 2018-10-27 NOTE — ED Provider Notes (Signed)
HPI  SUBJECTIVE:  Derrick Galvan. is a 57 y.o. male who presents with sharp, constant left lower rib pain in the midaxillary line starting 2 days ago.  He states it radiates into his back, and up into his left shoulder.  Reports shortness of breath secondary to the pain.  States he had a laughing spell followed by a coughing fit and states that the rib pain started the next day.  He also states that he was unloading a hay truck 5 days ago and has had a productive cough since.  He has been doing lots of coughing.  He denies any trauma to the chest.  He did not feel a pop with a laughing/coughing fit.  He denies fevers, wheezing, hemoptysis.  No calf pain, swelling, surgery in the past 4 weeks, recent immobilization.  There is no exertional component to his chest pain.  He denies nausea, diaphoresis.  Right ibuprofen 600 mg with improvement in symptoms and Naprosyn.  Symptoms worse with deep inspiration, torso rotation, going from lying to sitting up in bed.  He states that the chest pain is always in the same place.  He has a past medical history of skin cancer which was excised, pneumonia.  No history of smoking, DVT, PE, hypercoagulability, MI, hypercholesterolemia, asthma, eczema, COPD, diabetes, hypertension.  PMD: None.    Past Medical History:  Diagnosis Date  . Adaptive colitis 09/19/2013  . Chronic diarrhea 07/31/2013  . Hepatitis B 1989   resolved  . Skin cancer    on face - removed under local    Past Surgical History:  Procedure Laterality Date  . COLONOSCOPY  02/15/11   repeat 10 years  . COLONOSCOPY WITH PROPOFOL N/A 08/15/2016   Procedure: COLONOSCOPY WITH PROPOFOL;  Surgeon: Lucilla Lame, MD;  Location: [facility];  Service: Endoscopy;  Laterality: N/A;  . ESOPHAGOGASTRODUODENOSCOPY (EGD) WITH PROPOFOL N/A 08/15/2016   Procedure: ESOPHAGOGASTRODUODENOSCOPY (EGD) WITH PROPOFOL;  Surgeon: Lucilla Lame, MD;  Location: [facility];  Service: Endoscopy;  Laterality: N/A;   . POLYPECTOMY  08/15/2016   Procedure: POLYPECTOMY;  Surgeon: Lucilla Lame, MD;  Location: [facility];  Service: Endoscopy;;    Family History  Problem Relation Age of Onset  . COPD Father     Social History   Tobacco Use  . Smoking status: Never Smoker  . Smokeless tobacco: Never Used  Substance Use Topics  . Alcohol use: Yes    Alcohol/week: 0.0 standard drinks    Comment: occasional  . Drug use: No    No current facility-administered medications for this encounter.   Current Outpatient Medications:  .  bismuth subsalicylate (BISMATROL) 262 MG/15ML suspension, Take by mouth., Disp: , Rfl:  .  dicyclomine (BENTYL) 20 MG tablet, Take 1 tablet (20 mg total) by mouth 3 (three) times daily before meals., Disp: 90 tablet, Rfl: 6 .  diphenoxylate-atropine (LOMOTIL) 2.5-0.025 MG tablet, Take 1 tablet by mouth 4 (four) times daily as needed for diarrhea or loose stools., Disp: 60 tablet, Rfl: 5  No Known Allergies   ROS  As noted in HPI.   Physical Exam  BP (!) 145/84 (BP Location: Left Arm)   Pulse (!) 110   Temp 99.4 F (37.4 C) (Oral)   Resp (!) 24   Ht 5\' 11"  (1.803 m)   Wt 111.1 kg   SpO2 99%   BMI 34.17 kg/m   Constitutional: Well developed, well nourished, no acute distress Eyes:  EOMI, conjunctiva normal bilaterally HENT:  Normocephalic, atraumatic,mucus membranes moist Respiratory: Limited respiratory effort due to pain.  No bruising, crepitus.  Positive point rib tenderness along the left lower rib in the midaxillary line.  Pain aggravated with torso rotation.  No paradoxical chest wall movement.  Positive crackles base left lung. Cardiovascular: Regular tachycardia, no murmurs, rubs, gallops GI: nondistended skin: No rash, skin intact Musculoskeletal: Calves symmetric, nontender, no edema, no deformities Neurologic: Alert & oriented x 3, no focal neuro deficits Psychiatric: Speech and behavior appropriate   ED Course   Medications - No data  to display  Orders Placed This Encounter  Procedures  . DG Ribs Unilateral W/Chest Left    Standing Status:   Standing    Number of Occurrences:   1    Order Specific Question:   Reason for Exam (SYMPTOM  OR DIAGNOSIS REQUIRED)    Answer:   pain    No results found for this or any previous visit (from the past 24 hour(s)). Dg Ribs Unilateral W/chest Left  Result Date: 10/27/2018 CLINICAL DATA:  Difficulty breathing, cough with pain under the left breast EXAM: LEFT RIBS AND CHEST - 3+ VIEW COMPARISON:  None. FINDINGS: Single-view chest demonstrates low lung volumes. Airspace disease at the left lung base with small left pleural effusion. Normal heart size. No pneumothorax. Left rib series demonstrates no acute displaced rib fracture IMPRESSION: Small left pleural effusion with left basilar airspace disease suspicious for pneumonia. Electronically Signed   By: Donavan Foil M.D.   On: 10/27/2018 15:11    ED Clinical Impression  Rib pain on left side  Pneumonia of left lower lobe due to infectious organism [facility])   ED Assessment/Plan  [place] Narcotic database reviewed for this patient, and feel that the risk/benefit ratio today is favorable for proceeding with a prescription for controlled substance.  Prescriptions in the past 2 years.  Reviewed imaging independently.  Small left pleural effusion with left basilar airspace disease concerning for pneumonia. see radiology report for full details.  Suspect patient has a hairline fracture because he has point tenderness along the rib, with a resulting pneumonia.  Doubt PE, MI.  Suspect that the shoulder pain is referred from the pleural effusion/pneumonia.  Will send home with an incentive spirometer to use 4 times an hour, 600 mg of ibuprofen combined with 1 g of Tylenol 3 or 4 times a day for mild to moderate pain, or 600 mg ibuprofen combined with 1-2 Norco for severe pain.  Discussed with patient to not take Tylenol and Norco together.  Also  home azithromycin to cover pneumonia.  Providing primary care referral list.   Discussed  imaging, MDM, treatment plan, and plan for follow-up with patient. Discussed sn/sx that should prompt return to the ED. patient agrees with plan.   No orders of the defined types were placed in this encounter.   *This clinic note was created using Dragon dictation software. Therefore, there may be occasional mistakes despite careful proofreading.   ?    Melynda Ripple, MD 10/27/18 1710

## 2018-10-27 NOTE — ED Triage Notes (Signed)
Pt reports coughing fit on Thursday and yesterday started with left sided rib pain and hard to take a deep breath. Pain 10/10.

## 2018-10-27 NOTE — Discharge Instructions (Signed)
I suspect you have a hairline fracture of your rib and a subsequent pneumonia.  Finish the azithromycin even if you feel better.  Use the incentive spirometer ideally up to 4 times an hour.  600 mg of ibuprofen combined with 1 g of Tylenol together 3 or 4 times a day for mild to moderate pain.  If this does not work, then take 600 mg ibuprofen with 1-2 Norco the next time you are supposed to take medicine.  Do not take the Tylenol and Norco as we discussed.  Follow-up with the primary care physician of your choice, see list below.  Medially to the ER for fevers above 100.4, for pain not controlled with medication, coughing up any blood, worsening shortness of breath, or for any other concerns.  Here is a list of primary care providers who are taking new patients:  Dr. Otilio Miu, Dr. Adline Potter [address] [place] [address] [place] [address]  [phone]  [facility] [address] [place], [place] [address] [phone]  [facility] [place]  [phone] [place], [place] [address]  Here are clinics/ other resources who will see you if you do not have insurance. Some have certain criteria that you must meet. Call them and find out what they are:  [facility]: [address], [place], [place] [address] Phone: [phone] Hours: First and Third Saturdays of each Month, 9 a.m. - 1 p.m.  [facility]: [address] [place], [place], [place] [address] Phone: [phone] Hours: Tuesday, 4 p.m. - 8 p.m. Thursday, 1 p.m. - 8 p.m. Wednesday, 9 a.m. - [facility] [address] [place], [place] [address] Phone: [phone] Pharmacy Phone Number: [phone] Dental Phone Number: [phone] [place] Help: [phone]  Dental Hours: Monday - Thursday, 8 a.m. - 6 p.m.  [facility] [address] [place], [place] [address] Phone: [phone] Pharmacy Phone Number: [phone] [facility] Insurance Help: [phone]  [facility] [address] [place], [place] [address] Phone: [phone] Pharmacy Phone Number: [phone] [facility] Insurance Help: [phone]  [facility] [address] [place], [place] [address] Phone: [phone] [facility] Insurance Help: [phone]   [address] [place] [place] [place], [place] [address] Phone: [phone]  Go to www.goodrx.com to look up your medications. This will give you a list of where you can find your prescriptions at the most affordable prices. Or ask the pharmacist what the cash price is, or if they have any other discount programs available to help make your medication more affordable. This can be less expensive than what you would pay with insurance.

## 2018-11-08 ENCOUNTER — Telehealth: Payer: Self-pay | Admitting: Gastroenterology

## 2018-11-08 ENCOUNTER — Encounter: Payer: Self-pay | Admitting: Emergency Medicine

## 2018-11-08 ENCOUNTER — Ambulatory Visit
Admission: EM | Admit: 2018-11-08 | Discharge: 2018-11-08 | Disposition: A | Discharge status: Home / Self Care | Payer: Self-pay | Attending: Family Medicine | Admitting: Family Medicine

## 2018-11-08 DIAGNOSIS — T7840XA Allergy, unspecified, initial encounter: Secondary | ICD-10-CM | POA: Insufficient documentation

## 2018-11-08 LAB — C DIFFICILE QUICK SCREEN W PCR REFLEX
C Diff antigen: NEGATIVE
C Diff interpretation: NOT DETECTED
C Diff toxin: NEGATIVE

## 2018-11-08 MED ORDER — TRIAMCINOLONE ACETONIDE 0.025 % EX CREA: 1.0000 "application " | TOPICAL_CREAM | Freq: Two times a day (BID) | CUTANEOUS | 0 refills | Status: DC

## 2018-11-08 MED ORDER — METHYLPREDNISOLONE 4 MG PO TBPK: ORAL_TABLET | ORAL | 0 refills | Status: DC

## 2018-11-08 NOTE — Telephone Encounter (Signed)
Spoke with pt and advised him I was aware he was in the ER for the rash and diarrhea. Pt was given a medrol dosepak. I have advised him to take as it was directed. If symptoms of diarrhea do not improve, I have advised him to call me back.

## 2018-11-08 NOTE — ED Triage Notes (Signed)
Pt reports rash worsening over last 5-6 days since completing azithromycin 12/25 for pneumonia. Pt also reports has had diarrhea over the last week. Pt reports continued intermittent fever as well last one about 3 days ago

## 2018-11-08 NOTE — Telephone Encounter (Signed)
Pt left vm he would like a call regarding having diarrhea since he was on rx Citromax which he took his last dose on christmas eve and still having the diarrhea

## 2018-11-08 NOTE — ED Provider Notes (Signed)
[facility]    CSN: [id number] Arrival date & time: 11/08/18  0808     History   Chief Complaint Chief Complaint  Patient presents with  . Rash    HPI Deondra Wigger. is a 58 y.o. male.   HPI  58 year old male presents with a rash is noticed over the last 5 to 6 days and has been worsening since completing azithromycin on 10/31/2018 for pneumonia.  Has had diarrhea over the last week.  He is had 6 stools per day.  Does not report any vomiting or nausea.  He also complains of severe heartburn that noticed while taking the azithromycin.  This is since stopped.  Is concerned of having C. Difficile.  No blood or mucus in the diarrhea.  He does not complain of abdominal cramping or pain.         Past Medical History:  Diagnosis Date  . Adaptive colitis 09/19/2013  . Chronic diarrhea 07/31/2013  . Hepatitis B 1989   resolved  . Skin cancer    on face - removed under local    Patient Active Problem List   Diagnosis Date Noted  . Heartburn   . Acute gastritis without hemorrhage   . Reflux esophagitis   . Benign neoplasm of ascending colon   . Hepatitis B 07/09/2015  . Adaptive colitis 09/19/2013  . Chronic diarrhea 07/31/2013    Past Surgical History:  Procedure Laterality Date  . COLONOSCOPY  02/15/11   repeat 10 years  . COLONOSCOPY WITH PROPOFOL N/A 08/15/2016   Procedure: COLONOSCOPY WITH PROPOFOL;  Surgeon: Lucilla Lame, MD;  Location: [place];  Service: Endoscopy;  Laterality: N/A;  . ESOPHAGOGASTRODUODENOSCOPY (EGD) WITH PROPOFOL N/A 08/15/2016   Procedure: ESOPHAGOGASTRODUODENOSCOPY (EGD) WITH PROPOFOL;  Surgeon: Lucilla Lame, MD;  Location: [place];  Service: Endoscopy;  Laterality: N/A;  . POLYPECTOMY  08/15/2016   Procedure: POLYPECTOMY;  Surgeon: Lucilla Lame, MD;  Location: [place];  Service: Endoscopy;;       Home Medications    Prior to Admission medications   Medication Sig Start Date End Date Taking?  Authorizing Provider  azithromycin (ZITHROMAX) 250 MG tablet Take 1 tablet (250 mg total) by mouth daily. 2 tabs po on day 1, 1 tab po on days 2-5 10/27/18   Melynda Ripple, MD  HYDROcodone-acetaminophen (NORCO/VICODIN) 5-325 MG tablet Take 1-2 tablets by mouth every 6 (six) hours as needed for moderate pain or severe pain. 10/27/18   Melynda Ripple, MD  ibuprofen (ADVIL,MOTRIN) 600 MG tablet Take 1 tablet (600 mg total) by mouth every 6 (six) hours as needed. 10/27/18   Melynda Ripple, MD  methylPREDNISolone (MEDROL DOSEPAK) 4 MG TBPK tablet Take per package instructions 11/08/18   Lorin Picket, PA-C  triamcinolone (KENALOG) 0.025 % cream Apply 1 application topically 2 (two) times daily. 11/08/18   Lorin Picket, PA-C    Family History Family History  Problem Relation Age of Onset  . COPD Father     Social History Social History   Tobacco Use  . Smoking status: Never Smoker  . Smokeless tobacco: Never Used  Substance Use Topics  . Alcohol use: Yes    Alcohol/week: 0.0 standard drinks    Comment: occasional  . Drug use: No     Allergies   Patient has no known allergies.   Review of Systems Review of Systems   Physical Exam Triage Vital Signs ED Triage Vitals  Enc Vitals Group  BP 11/08/18 0819 (!) 145/86     Pulse Rate 11/08/18 0819 (!) 101     Resp 11/08/18 0819 16     Temp 11/08/18 0819 98.8 F (37.1 C)     Temp Source 11/08/18 0819 Oral     SpO2 11/08/18 0819 99 %     Weight 11/08/18 0820 245 lb (111.1 kg)     Height 11/08/18 0820 5\' 11"  (1.803 m)     Head Circumference --      Peak Flow --      Pain Score 11/08/18 0820 0     Pain Loc --      Pain Edu? --      Excl. in [place]? --    No data found.  Updated Vital Signs BP (!) 145/86 (BP Location: Right Arm)   Pulse (!) 101   Temp 98.8 F (37.1 C) (Oral)   Resp 16   Ht 5\' 11"  (1.803 m)   Wt 245 lb (111.1 kg)   SpO2 99%   BMI 34.17 kg/m   Visual Acuity Right Eye Distance:   Left  Eye Distance:   Bilateral Distance:    Right Eye Near:   Left Eye Near:    Bilateral Near:     Physical Exam   UC Treatments / Results  Labs (all labs ordered are listed, but only abnormal results are displayed) Labs Reviewed - No data to display  EKG None  Radiology No results found.  Procedures Procedures (including critical care time)  Medications Ordered in UC Medications - No data to display  Initial Impression / Assessment and Plan / UC Course  I have reviewed the triage vital signs and the nursing notes.  Pertinent labs & imaging results that were available during my care of the patient were reviewed by me and considered in my medical decision making (see chart for details).   And likely had allergic reaction to the azithromycin.  I doubt that he has C. difficile will test with a rapid test when he returns a sample of the stool.  The rash that is developed I will place him on a Medrol Dosepak and also provide him with triamcinolone cream that he can use topically for the very itchy areas.  If is not improving I recommended that he follow-up with dermatology   Final Clinical Impressions(s) / UC Diagnoses   Final diagnoses:  Allergic reaction, initial encounter   Discharge Instructions   None    ED Prescriptions    Medication Sig Dispense Auth. Provider   methylPREDNISolone (MEDROL DOSEPAK) 4 MG TBPK tablet Take per package instructions 21 tablet Lorin Picket, PA-C   triamcinolone (KENALOG) 0.025 % cream  (Status: Discontinued) Apply 1 application topically 2 (two) times daily. 30 g Crecencio Mc P, PA-C   triamcinolone (KENALOG) 0.025 % cream  (Status: Discontinued) Apply 1 application topically 2 (two) times daily. 30 g Crecencio Mc P, PA-C   triamcinolone (KENALOG) 0.025 % cream Apply 1 application topically 2 (two) times daily. 30 g Lorin Picket, PA-C     Controlled Substance Prescriptions [place] Controlled Substance Registry consulted? Not  Applicable   Lorin Picket, PA-C 11/08/18 2201

## 2018-11-09 ENCOUNTER — Telehealth: Payer: Self-pay | Admitting: Gastroenterology

## 2018-11-09 ENCOUNTER — Other Ambulatory Visit: Payer: Self-pay

## 2018-11-09 NOTE — Telephone Encounter (Signed)
He went to the urgent care on 10/27/18 for pneumonia and was prescribed by the doctor there. The ER checked for c-diff yesterday and it was negative.

## 2018-11-09 NOTE — Telephone Encounter (Signed)
Who gave him the z-pack? Did someone check him for c. Diff?

## 2018-11-09 NOTE — Telephone Encounter (Signed)
This pt went to the ER yesterday for a rash that he developed while taking a Z pack. He also developed a lot of diarrhea. He was given a medrol dose pack to take for the rash and diarrhea. Pt called back this morning stating he has no improvement in his diarrhea. I am assuming he will need to give this more time to work. Can he add imodium? He sees [place] for IBS-D. Please advise.

## 2018-11-09 NOTE — Telephone Encounter (Signed)
Pt notified to restart his Lomotil. Pt will call me back on Monday if symptoms do not improve.

## 2018-11-09 NOTE — Telephone Encounter (Signed)
PATIENT CALLED & L/M MACHINE STATING HE HAD SPOKEN TO GINGER YESTERDAY ABOUT HIS CONDITION & HE HAS TAKEN THE MEDICATION WITH NO IMPROVEMENT.PLEASE CALL

## 2018-11-09 NOTE — Telephone Encounter (Signed)
Tell the patient to go back on his Lomotil.

## 2018-11-14 ENCOUNTER — Telehealth: Payer: Self-pay | Admitting: Gastroenterology

## 2018-11-14 NOTE — Telephone Encounter (Signed)
We can try the patient on Vibrozi or lotrenex. Lets sart with Vibrozi.

## 2018-11-14 NOTE — Telephone Encounter (Signed)
Pt is calling for Ginger he is still having Diarrhea the Lamodome is not working he wants to know the next step

## 2018-11-14 NOTE — Telephone Encounter (Signed)
Pt notified to restart Viberzi. Pt had samples from previous appts. If this does not work, pt will be brought back into the clinic for additional testing.

## 2018-11-16 ENCOUNTER — Ambulatory Visit: Payer: BLUE CROSS/BLUE SHIELD | Admitting: Internal Medicine

## 2018-11-16 ENCOUNTER — Encounter: Payer: Self-pay | Admitting: Internal Medicine

## 2018-11-16 VITALS — BP 110/64 | HR 74 | Ht 71.0 in | Wt 258.8 lb

## 2018-11-16 DIAGNOSIS — D485 Neoplasm of uncertain behavior of skin: Secondary | ICD-10-CM

## 2018-11-16 DIAGNOSIS — K589 Irritable bowel syndrome without diarrhea: Secondary | ICD-10-CM

## 2018-11-16 DIAGNOSIS — L270 Generalized skin eruption due to drugs and medicaments taken internally: Secondary | ICD-10-CM | POA: Diagnosis not present

## 2018-11-16 DIAGNOSIS — F411 Generalized anxiety disorder: Secondary | ICD-10-CM | POA: Diagnosis not present

## 2018-11-16 DIAGNOSIS — J189 Pneumonia, unspecified organism: Secondary | ICD-10-CM

## 2018-11-16 DIAGNOSIS — J181 Lobar pneumonia, unspecified organism: Secondary | ICD-10-CM

## 2018-11-16 MED ORDER — DIPHENOXYLATE-ATROPINE 2.5-0.025 MG PO TABS: 1.0000 | ORAL_TABLET | Freq: Four times a day (QID) | ORAL | 0 refills | Status: DC | PRN

## 2018-11-16 MED ORDER — PREDNISONE 10 MG PO TABS: ORAL_TABLET | ORAL | 0 refills | Status: DC

## 2018-11-16 NOTE — Progress Notes (Signed)
Date:  11/16/2018   Name:  Derrick Galvan.   DOB:  May 04, 1961   MRN:  [id number]   Chief Complaint: Establish Care; Urticaria; and Anxiety (/ Depresion. Gets overwhelmed with things.)  CAP -  He was seen in UC in December and CXR suggested left basilar pneumonia.  He was treated with a zpak and subsequently developed worsening of his chronic diarrhea and an allergic rash.  He has no shortness of breath or cough but is still sore along the left lower rib.  Allergic reaction - the rash continues, despite completing prednisone taper.  The rash is on arms, groin, back and abdomen. It is itchy, not painful.  He is taking benadryl and pepcid bid.  Anxiety - had a severe panic attack in 2012 and now having more anxiety with certain situations.  He has never taken any medication for anxiety or depression.  He finds that he is not as social as he has been in the past.  IBD with diarrhea - managed by GI.  Mostly takes lomotil 4 times per day for the past week but still having diarrhea until this AM had a formed stool. He is hopeful that the diarrhea is improving but waiting for a call from Dr. Allen Norris about the next step if needed.  Review of Systems  Constitutional: Negative for chills, fatigue and fever.  HENT: Negative for trouble swallowing.   Respiratory: Negative for cough, chest tightness, shortness of breath and wheezing.   Cardiovascular: Positive for chest pain (along left rib still sore). Negative for palpitations and leg swelling.  Gastrointestinal: Positive for diarrhea. Negative for abdominal pain and blood in stool.  Musculoskeletal: Negative for arthralgias and gait problem.  Skin: Positive for color change and rash.  Neurological: Negative for dizziness and headaches.  Hematological: Negative for adenopathy.  Psychiatric/Behavioral: Positive for sleep disturbance. Negative for decreased concentration, dysphoric mood and suicidal ideas. The patient is nervous/anxious.     Patient  Active Problem List   Diagnosis Date Noted  . Heartburn   . Reflux esophagitis   . Benign neoplasm of ascending colon   . Hepatitis B 07/09/2015  . Adaptive colitis 09/19/2013  . Chronic diarrhea 07/31/2013    Allergies  Allergen Reactions  . Azithromycin Hives    Past Surgical History:  Procedure Laterality Date  . COLONOSCOPY  02/15/11   repeat 10 years  . COLONOSCOPY WITH PROPOFOL N/A 08/15/2016   Procedure: COLONOSCOPY WITH PROPOFOL;  Surgeon: Lucilla Lame, MD;  Location: [place];  Service: Endoscopy;  Laterality: N/A;  . ESOPHAGOGASTRODUODENOSCOPY (EGD) WITH PROPOFOL N/A 08/15/2016   Procedure: ESOPHAGOGASTRODUODENOSCOPY (EGD) WITH PROPOFOL;  Surgeon: Lucilla Lame, MD;  Location: [place];  Service: Endoscopy;  Laterality: N/A;  . POLYPECTOMY  08/15/2016   Procedure: POLYPECTOMY;  Surgeon: Lucilla Lame, MD;  Location: [place];  Service: Endoscopy;;    Social History   Tobacco Use  . Smoking status: Never Smoker  . Smokeless tobacco: Never Used  Substance Use Topics  . Alcohol use: Yes    Alcohol/week: 0.0 standard drinks    Comment: occasional  . Drug use: No     Medication list has been reviewed and updated.  Current Meds  Medication Sig  . acetaminophen (TYLENOL) 500 MG tablet Take 1,000 mg by mouth every 6 (six) hours as needed.  . bismuth subsalicylate (PEPTO-BISMOL) 262 MG/15ML suspension Take 30 mLs by mouth every 6 (six) hours as needed. OTC  . diphenoxylate-atropine (LOMOTIL) 2.5-0.025 MG  tablet Take 1 tablet by mouth 4 (four) times daily as needed for diarrhea or loose stools.  . famotidine (PEPCID) 10 MG tablet Take 10 mg by mouth 2 (two) times daily. OTC  . ibuprofen (ADVIL,MOTRIN) 600 MG tablet Take 1 tablet (600 mg total) by mouth every 6 (six) hours as needed.  . triamcinolone cream (KENALOG) 0.1 % Apply 1 application topically 2 (two) times daily. HIVES from Azithromy reaction.  . [DISCONTINUED] diphenoxylate-atropine  (LOMOTIL) 2.5-0.025 MG tablet Take by mouth 4 (four) times daily as needed for diarrhea or loose stools.    PHQ 2/9 Scores 11/16/2018  PHQ - 2 Score 2  PHQ- 9 Score 6   GAD 7 : Generalized Anxiety Score 11/16/2018  Nervous, Anxious, on Edge 2  Control/stop worrying 3  Worry too much - different things 3  Trouble relaxing 0  Restless 0  Easily annoyed or irritable 2  Afraid - awful might happen 1  Total GAD 7 Score 11  Anxiety Difficulty Somewhat difficult     Physical Exam Vitals signs and nursing note reviewed.  Constitutional:      General: He is not in acute distress.    Appearance: He is well-developed.  HENT:     Head: Normocephalic and atraumatic.  Eyes:     Pupils: Pupils are equal, round, and reactive to light.  Neck:     Musculoskeletal: Normal range of motion and neck supple.     Vascular: No carotid bruit.  Cardiovascular:     Rate and Rhythm: Normal rate and regular rhythm.     Pulses: Normal pulses.  Pulmonary:     Effort: Pulmonary effort is normal. No respiratory distress.     Breath sounds: Normal breath sounds.  Musculoskeletal: Normal range of motion.  Skin:    General: Skin is warm and dry.     Findings: Erythema and rash present.          Comments: Macular rash scattered over chest, abdomen, back and arms  Neurological:     Mental Status: He is alert and oriented to person, place, and time.  Psychiatric:        Attention and Perception: Attention normal.        Mood and Affect: Mood normal.        Behavior: Behavior normal.        Thought Content: Thought content normal.     BP 110/64 (BP Location: Right Arm, Patient Position: Sitting, Cuff Size: Large)   Pulse 74   Ht 5\' 11"  (1.803 m)   Wt 258 lb 12.8 oz (117.4 kg)   SpO2 96%   BMI 36.10 kg/m   Assessment and Plan: 1. Allergic drug rash Will prescribe a longer taper See Dermatology as soon as possible - predniSONE (DELTASONE) 10 MG tablet; Take 6 on day 1and 2, 5 on day 3 and 4, 4  on day 5 and 6 , 3 on day 7 and 8, 2 on day 9 and 10 and 1 on day 11 and 12 then stop.  Dispense: 42 tablet; Refill: 0  2. Adaptive colitis Continue lomotil Await advice from GI - diphenoxylate-atropine (LOMOTIL) 2.5-0.025 MG tablet; Take 1 tablet by mouth 4 (four) times daily as needed for diarrhea or loose stools.  Dispense: 120 tablet; Refill: 0  3. Neoplasm of uncertain behavior of skin of face Seeing Dermatology in 2 weeks  4. Generalized anxiety disorder Will reassess at next visit unless he worsens rapidly Do not want to start other medication  until rash resolves  5. Community acquired pneumonia of left lower lobe of lung ([place]) Will need repeat CXR at next visit No further antibiotics needed at this time.   Partially dictated using Editor, commissioning. Any errors are unintentional.  Halina Maidens, MD [facility]  11/16/2018

## 2018-12-18 ENCOUNTER — Encounter: Payer: Self-pay | Admitting: Internal Medicine

## 2018-12-18 ENCOUNTER — Ambulatory Visit
Admission: RE | Admit: 2018-12-18 | Discharge: 2018-12-18 | Disposition: A | Discharge status: Home / Self Care | Payer: BLUE CROSS/BLUE SHIELD | Source: Ambulatory Visit | Attending: Internal Medicine | Admitting: Internal Medicine

## 2018-12-18 ENCOUNTER — Other Ambulatory Visit: Payer: Self-pay

## 2018-12-18 ENCOUNTER — Ambulatory Visit (INDEPENDENT_AMBULATORY_CARE_PROVIDER_SITE_OTHER): Payer: BLUE CROSS/BLUE SHIELD | Admitting: Internal Medicine

## 2018-12-18 ENCOUNTER — Ambulatory Visit: Admission: RE | Admit: 2018-12-18 | Payer: BLUE CROSS/BLUE SHIELD | Source: Home / Self Care

## 2018-12-18 VITALS — BP 118/68 | HR 92 | Temp 97.9°F | Ht 71.0 in | Wt 263.0 lb

## 2018-12-18 DIAGNOSIS — K589 Irritable bowel syndrome without diarrhea: Secondary | ICD-10-CM

## 2018-12-18 DIAGNOSIS — G479 Sleep disorder, unspecified: Secondary | ICD-10-CM

## 2018-12-18 DIAGNOSIS — J189 Pneumonia, unspecified organism: Secondary | ICD-10-CM

## 2018-12-18 DIAGNOSIS — F419 Anxiety disorder, unspecified: Secondary | ICD-10-CM

## 2018-12-18 DIAGNOSIS — F411 Generalized anxiety disorder: Secondary | ICD-10-CM | POA: Diagnosis not present

## 2018-12-18 DIAGNOSIS — J181 Lobar pneumonia, unspecified organism: Secondary | ICD-10-CM | POA: Insufficient documentation

## 2018-12-18 DIAGNOSIS — R21 Rash and other nonspecific skin eruption: Secondary | ICD-10-CM

## 2018-12-18 NOTE — Progress Notes (Signed)
Date:  12/18/2018   Name:  Derrick Galvan.   DOB:  Apr 04, 1961   MRN:  [id number]   Chief Complaint: Pneumonia (Follow up. ); Rash; and Anxiety (GAD7- )  Pneumonia  There is no chest tightness, cough, shortness of breath or wheezing. The problem occurs rarely. The problem has been resolved. Pertinent negatives include no chest pain, fever, headaches, nasal congestion, sore throat or trouble swallowing. He reports complete improvement on treatment.  Rash  This is a chronic problem. The problem has been gradually improving since onset. The rash is diffuse. The rash is characterized by redness and itchiness (less urticarial). Pertinent negatives include no cough, diarrhea, fatigue, fever, shortness of breath or sore throat. Past treatments include oral steroids (one short course and did not take the course from last visit). The treatment provided mild relief.  Anxiety  Presents for follow-up visit. Symptoms include insomnia, nervous/anxious behavior and restlessness. Patient reports no chest pain, depressed mood, dizziness, nausea, palpitations, shortness of breath or suicidal ideas. Symptoms occur most days. The severity of symptoms is moderate. The quality of sleep is fair. Nighttime awakenings: one to two (but gets up and watches TV rather than staying in bed).    Diarrhea -  Now taking a probiotic supplement daily and stools are much better.  Not using much imodium now.  He is also using Noni - a supplement from [place].  Review of Systems  Constitutional: Negative for chills, fatigue and fever.  HENT: Negative for sore throat and trouble swallowing.   Eyes: Negative for visual disturbance.  Respiratory: Negative for cough, choking, shortness of breath and wheezing.   Cardiovascular: Negative for chest pain and palpitations.  Gastrointestinal: Negative for blood in stool, diarrhea and nausea.  Skin: Positive for rash.  Allergic/Immunologic: Negative for environmental allergies.    Neurological: Negative for dizziness, light-headedness and headaches.  Hematological: Negative for adenopathy.  Psychiatric/Behavioral: Positive for sleep disturbance. Negative for behavioral problems, dysphoric mood and suicidal ideas. The patient is nervous/anxious and has insomnia.     Patient Active Problem List   Diagnosis Date Noted  . Anxiety disorder 12/18/2018  . Heartburn   . Reflux esophagitis   . Benign neoplasm of ascending colon   . Hepatitis B 07/09/2015  . Adaptive colitis 09/19/2013  . Chronic diarrhea 07/31/2013    Allergies  Allergen Reactions  . Azithromycin Hives    Past Surgical History:  Procedure Laterality Date  . COLONOSCOPY  02/15/11   repeat 10 years  . COLONOSCOPY WITH PROPOFOL N/A 08/15/2016   Procedure: COLONOSCOPY WITH PROPOFOL;  Surgeon: Lucilla Lame, MD;  Location: [facility];  Service: Endoscopy;  Laterality: N/A;  . ESOPHAGOGASTRODUODENOSCOPY (EGD) WITH PROPOFOL N/A 08/15/2016   Procedure: ESOPHAGOGASTRODUODENOSCOPY (EGD) WITH PROPOFOL;  Surgeon: Lucilla Lame, MD;  Location: [place];  Service: Endoscopy;  Laterality: N/A;  . POLYPECTOMY  08/15/2016   Procedure: POLYPECTOMY;  Surgeon: Lucilla Lame, MD;  Location: [place];  Service: Endoscopy;;    Social History   Tobacco Use  . Smoking status: Never Smoker  . Smokeless tobacco: Never Used  Substance Use Topics  . Alcohol use: Yes    Alcohol/week: 0.0 standard drinks    Comment: occasional  . Drug use: No     Medication list has been reviewed and updated.  Current Meds  Medication Sig  . acetaminophen (TYLENOL) 500 MG tablet Take 1,000 mg by mouth every 6 (six) hours as needed.  . bismuth subsalicylate (PEPTO-BISMOL) 262 MG/15ML  suspension Take 30 mLs by mouth every 6 (six) hours as needed. OTC  . diphenoxylate-atropine (LOMOTIL) 2.5-0.025 MG tablet Take 1 tablet by mouth 4 (four) times daily as needed for diarrhea or loose stools.  . famotidine  (PEPCID) 10 MG tablet Take 10 mg by mouth 2 (two) times daily. OTC  . ibuprofen (ADVIL,MOTRIN) 600 MG tablet Take 1 tablet (600 mg total) by mouth every 6 (six) hours as needed.  . triamcinolone cream (KENALOG) 0.1 % Apply 1 application topically 2 (two) times daily. HIVES from Azithromy reaction.    PHQ 2/9 Scores 12/18/2018 11/16/2018  PHQ - 2 Score 3 2  PHQ- 9 Score 12 6   GAD 7 : Generalized Anxiety Score 12/18/2018 11/16/2018  Nervous, Anxious, on Edge 2 2  Control/stop worrying 3 3  Worry too much - different things 3 3  Trouble relaxing 0 0  Restless 0 0  Easily annoyed or irritable 3 2  Afraid - awful might happen 3 1  Total GAD 7 Score 14 11  Anxiety Difficulty Very difficult Somewhat difficult     Wt Readings from Last 3 Encounters:  12/18/18 263 lb (119.3 kg)  11/16/18 258 lb 12.8 oz (117.4 kg)  11/08/18 245 lb (111.1 kg)    Physical Exam Vitals signs and nursing note reviewed.  Constitutional:      General: He is not in acute distress.    Appearance: Normal appearance. He is well-developed.  HENT:     Head: Normocephalic and atraumatic.  Eyes:     Pupils: Pupils are equal, round, and reactive to light.  Neck:     Musculoskeletal: Normal range of motion and neck supple.  Cardiovascular:     Rate and Rhythm: Normal rate and regular rhythm.     Pulses: Normal pulses.  Pulmonary:     Effort: Pulmonary effort is normal. No respiratory distress.     Breath sounds: Normal breath sounds.  Musculoskeletal: Normal range of motion.  Skin:    General: Skin is warm and dry.     Findings: Erythema and rash present.     Comments: Diffuse erythematous macular rash varying from 2 - 5 mm is size over chest, arms, back and abdomen.  Left jaw region - healing cyst excision  Neurological:     Mental Status: He is alert and oriented to person, place, and time.  Psychiatric:        Behavior: Behavior normal.        Thought Content: Thought content normal.     BP 118/68    Pulse 92   Temp 97.9 F (36.6 C) (Oral)   Ht 5\' 11"  (1.803 m)   Wt 263 lb (119.3 kg)   SpO2 96%   BMI 36.68 kg/m   Assessment and Plan: 1. Community acquired pneumonia of left lower lobe of lung ([place]) Clinically resolved; repeat CXR to document clearing - DG Chest 2 View; Future  2. Adaptive colitis Improved with probiotics and Noni juice  3. Generalized anxiety disorder Recommend continued exercise without medication at this time  4. Sleep disturbance Sleep hygiene measures discussed  5. Rash of unknown cause Try loratidine 10 mg tid If no resolution, may need to see Dr. Phillip Heal for biopsy   Partially dictated using Dragon software. Any errors are unintentional.  Halina Maidens, MD [facility]  12/18/2018

## 2018-12-18 NOTE — Patient Instructions (Signed)
Loratidine 10 mg - three times a day

## 2018-12-20 ENCOUNTER — Telehealth: Payer: Self-pay

## 2018-12-20 NOTE — Telephone Encounter (Signed)
Pt called asking about XR results. Informed XR shows pnuemonia has cleared. Patient has an appt tomorrow for Left Calf swelling and possible DVT.   Told him if pain gets worse or swelling becomes worse to go to ER to be evaluated for DVT.  He verbalized understanding of this.

## 2018-12-21 ENCOUNTER — Other Ambulatory Visit: Payer: Self-pay

## 2018-12-21 ENCOUNTER — Encounter: Payer: Self-pay | Admitting: Internal Medicine

## 2018-12-21 ENCOUNTER — Ambulatory Visit
Admission: RE | Admit: 2018-12-21 | Discharge: 2018-12-21 | Disposition: A | Discharge status: Home / Self Care | Payer: BLUE CROSS/BLUE SHIELD | Source: Ambulatory Visit | Attending: Internal Medicine | Admitting: Internal Medicine

## 2018-12-21 ENCOUNTER — Ambulatory Visit: Payer: BLUE CROSS/BLUE SHIELD | Admitting: Internal Medicine

## 2018-12-21 VITALS — BP 128/70 | HR 100 | Ht 71.0 in | Wt 263.0 lb

## 2018-12-21 DIAGNOSIS — I82409 Acute embolism and thrombosis of unspecified deep veins of unspecified lower extremity: Secondary | ICD-10-CM

## 2018-12-21 DIAGNOSIS — M7989 Other specified soft tissue disorders: Secondary | ICD-10-CM | POA: Diagnosis present

## 2018-12-21 HISTORY — DX: Acute embolism and thrombosis of unspecified deep veins of unspecified lower extremity: I82.409

## 2018-12-21 NOTE — Progress Notes (Signed)
Date:  12/21/2018   Name:  Derrick Galvan.   DOB:  03-24-1961   MRN:  [id number]   Chief Complaint: Edema (Left Calf red and swollen. Painful. Never had DVT before but concerned of one. )  Leg swelling - noticed it 2 days after after yoga.  Left posterior calf was tender and swollen with edema at the ankle.  Today the swelling is reduced but still present.  No risk factors for DVT - no prolonged sitting, no hx of DVT, no family hx.  He is non smoker.  Review of Systems  Constitutional: Negative for fatigue and fever.  Respiratory: Negative for cough and shortness of breath.   Cardiovascular: Positive for leg swelling. Negative for chest pain and palpitations.  Neurological: Negative for dizziness, light-headedness and headaches.  Hematological: Does not bruise/bleed easily.  Psychiatric/Behavioral: Negative for sleep disturbance.    Patient Active Problem List   Diagnosis Date Noted  . Anxiety disorder 12/18/2018  . Rash of unknown cause 12/18/2018  . Sleep disturbance 12/18/2018  . Heartburn   . Reflux esophagitis   . Benign neoplasm of ascending colon   . Hepatitis B 07/09/2015  . Adaptive colitis 09/19/2013  . Chronic diarrhea 07/31/2013    Allergies  Allergen Reactions  . Azithromycin Hives    Past Surgical History:  Procedure Laterality Date  . COLONOSCOPY  02/15/11   repeat 10 years  . COLONOSCOPY WITH PROPOFOL N/A 08/15/2016   Procedure: COLONOSCOPY WITH PROPOFOL;  Surgeon: Lucilla Lame, MD;  Location: [place];  Service: Endoscopy;  Laterality: N/A;  . ESOPHAGOGASTRODUODENOSCOPY (EGD) WITH PROPOFOL N/A 08/15/2016   Procedure: ESOPHAGOGASTRODUODENOSCOPY (EGD) WITH PROPOFOL;  Surgeon: Lucilla Lame, MD;  Location: [place];  Service: Endoscopy;  Laterality: N/A;  . POLYPECTOMY  08/15/2016   Procedure: POLYPECTOMY;  Surgeon: Lucilla Lame, MD;  Location: [place];  Service: Endoscopy;;    Social History   Tobacco Use  . Smoking  status: Never Smoker  . Smokeless tobacco: Never Used  Substance Use Topics  . Alcohol use: Yes    Alcohol/week: 0.0 standard drinks    Comment: occasional  . Drug use: No     Medication list has been reviewed and updated.  Current Meds  Medication Sig  . acetaminophen (TYLENOL) 500 MG tablet Take 1,000 mg by mouth every 6 (six) hours as needed.  . bismuth subsalicylate (PEPTO-BISMOL) 262 MG/15ML suspension Take 30 mLs by mouth every 6 (six) hours as needed. OTC  . diphenoxylate-atropine (LOMOTIL) 2.5-0.025 MG tablet Take 1 tablet by mouth 4 (four) times daily as needed for diarrhea or loose stools.  . famotidine (PEPCID) 10 MG tablet Take 10 mg by mouth 2 (two) times daily. OTC  . ibuprofen (ADVIL,MOTRIN) 600 MG tablet Take 1 tablet (600 mg total) by mouth every 6 (six) hours as needed.  . triamcinolone cream (KENALOG) 0.1 % Apply 1 application topically 2 (two) times daily. HIVES from Azithromy reaction.    PHQ 2/9 Scores 12/18/2018 11/16/2018  PHQ - 2 Score 3 2  PHQ- 9 Score 12 6    Physical Exam Vitals signs and nursing note reviewed.  Constitutional:      General: He is not in acute distress.    Appearance: He is well-developed.  HENT:     Head: Normocephalic and atraumatic.  Cardiovascular:     Rate and Rhythm: Normal rate and regular rhythm.     Pulses: Normal pulses.  Pulmonary:     Effort: Pulmonary  effort is normal. No respiratory distress.     Breath sounds: Normal breath sounds. No wheezing or rhonchi.  Musculoskeletal: Normal range of motion.     Comments: Left calf 1 in larger than right Mild posterior tenderness 1+ edema distal calf  Skin:    General: Skin is warm and dry.     Findings: No rash.  Neurological:     Mental Status: He is alert and oriented to person, place, and time.  Psychiatric:        Behavior: Behavior normal.        Thought Content: Thought content normal.     BP 128/70   Pulse 100   Ht 5\' 11"  (1.803 m)   Wt 263 lb (119.3 kg)    SpO2 96%   BMI 36.68 kg/m   Assessment and Plan: 1. Swelling of calf Xarelto started pack given - US Venous Img Lower Unilateral Left   Partially dictated using Editor, commissioning. Any errors are unintentional.  Halina Maidens, MD [facility]  12/21/2018

## 2018-12-21 NOTE — Progress Notes (Signed)
Called and left VM informing patient of [place]. Told him to call back with any questions. Told him to take medication and call when he is to once daily.

## 2019-01-10 ENCOUNTER — Encounter: Payer: Self-pay | Admitting: Emergency Medicine

## 2019-01-10 ENCOUNTER — Ambulatory Visit
Admission: EM | Admit: 2019-01-10 | Discharge: 2019-01-10 | Disposition: A | Discharge status: Home / Self Care | Payer: BLUE CROSS/BLUE SHIELD | Attending: Family Medicine | Admitting: Family Medicine

## 2019-01-10 ENCOUNTER — Other Ambulatory Visit: Payer: Self-pay

## 2019-01-10 DIAGNOSIS — Z872 Personal history of diseases of the skin and subcutaneous tissue: Secondary | ICD-10-CM | POA: Diagnosis not present

## 2019-01-10 DIAGNOSIS — T148XXA Other injury of unspecified body region, initial encounter: Secondary | ICD-10-CM | POA: Diagnosis not present

## 2019-01-10 MED ORDER — ACETAMINOPHEN 500 MG PO TABS
1000.0000 mg | ORAL_TABLET | Freq: Once | ORAL | Status: AC
  Administered 2019-01-10: 1000 mg via ORAL

## 2019-01-10 NOTE — Discharge Instructions (Signed)
Leave compressive dress on.  If bleeding recurs, go to the ER.  Take care  Dr. Lacinda Axon

## 2019-01-10 NOTE — ED Provider Notes (Addendum)
[facility]    CSN: [id number] Arrival date & time: 01/10/19  1942  History   Chief Complaint Chief Complaint  Patient presents with  . bleeding sutures    face   HPI  58 year old male presents with a bleeding wound.  Per patient, he had a cyst removed from his left cheek on 2/28.  He was back in the dermatology office today due to fluid collection/seroma.  He was doing fine until this evening when he caught his pain subsequently developed brisk bleeding from his wound.  He is currently on Xarelto for DVT.  EMS was called.  Wound was dressed.  Patient continues to bleed.  Patient states that he feels slightly weak/faint. No other reported symptoms.  No other complaints or concerns at this time  PMH, Surgical Hx, Family Hx, Social History reviewed and updated as below.  Past Medical History:  Diagnosis Date  . Acute gastritis without hemorrhage   . Adaptive colitis 09/19/2013  . Chronic diarrhea 07/31/2013  . Hepatitis B 1989   resolved  . Skin cancer    on face - removed under local   Patient Active Problem List   Diagnosis Date Noted  . Anxiety disorder 12/18/2018  . Rash of unknown cause 12/18/2018  . Sleep disturbance 12/18/2018  . Heartburn   . Reflux esophagitis   . Benign neoplasm of ascending colon   . Hepatitis B 07/09/2015  . Adaptive colitis 09/19/2013  . Chronic diarrhea 07/31/2013    Past Surgical History:  Procedure Laterality Date  . COLONOSCOPY  02/15/11   repeat 10 years  . COLONOSCOPY WITH PROPOFOL N/A 08/15/2016   Procedure: COLONOSCOPY WITH PROPOFOL;  Surgeon: Lucilla Lame, MD;  Location: [place];  Service: Endoscopy;  Laterality: N/A;  . ESOPHAGOGASTRODUODENOSCOPY (EGD) WITH PROPOFOL N/A 08/15/2016   Procedure: ESOPHAGOGASTRODUODENOSCOPY (EGD) WITH PROPOFOL;  Surgeon: Lucilla Lame, MD;  Location: [place];  Service: Endoscopy;  Laterality: N/A;  . POLYPECTOMY  08/15/2016   Procedure: POLYPECTOMY;  Surgeon: Lucilla Lame, MD;  Location: [place];  Service: Endoscopy;;    Home Medications    Prior to Admission medications   Medication Sig Start Date End Date Taking? Authorizing Provider  acetaminophen (TYLENOL) 500 MG tablet Take 1,000 mg by mouth every 6 (six) hours as needed.   Yes [provider]  bismuth subsalicylate (PEPTO-BISMOL) 262 MG/15ML suspension Take 30 mLs by mouth every 6 (six) hours as needed. OTC   Yes [provider]  diphenoxylate-atropine (LOMOTIL) 2.5-0.025 MG tablet Take 1 tablet by mouth 4 (four) times daily as needed for diarrhea or loose stools. 11/16/18  Yes Glean Hess, MD  Rivaroxaban [employer] STARTER PACK PO) Take by mouth.   Yes [provider]  famotidine (PEPCID) 10 MG tablet Take 10 mg by mouth 2 (two) times daily. OTC    [provider]  ibuprofen (ADVIL,MOTRIN) 600 MG tablet Take 1 tablet (600 mg total) by mouth every 6 (six) hours as needed. 10/27/18   Melynda Ripple, MD  triamcinolone cream (KENALOG) 0.1 % Apply 1 application topically 2 (two) times daily. HIVES from Azithromy reaction.    [provider]    Family History Family History  Problem Relation Age of Onset  . Healthy Mother   . COPD Father     Social History Social History   Tobacco Use  . Smoking status: Never Smoker  . Smokeless tobacco: Never Used  Substance Use Topics  . Alcohol use: Yes  Alcohol/week: 0.0 standard drinks    Comment: occasional  . Drug use: No     Allergies   Azithromycin and Keflex [cephalexin]   Review of Systems Review of Systems  Skin: Positive for wound.       Bleeding.  Neurological:       Feels faint.   Physical Exam Triage Vital Signs ED Triage Vitals  Enc Vitals Group     BP 01/10/19 1952 135/88     Pulse Rate 01/10/19 1952 90     Resp 01/10/19 1952 18     Temp 01/10/19 1952 97.9 F (36.6 C)     Temp Source 01/10/19 1952 Oral     SpO2 01/10/19 1952 98 %     Weight 01/10/19 1952  259 lb (117.5 kg)     Height 01/10/19 1952 5\' 11"  (1.803 m)     Head Circumference --      Peak Flow --      Pain Score 01/10/19 1953 0     Pain Loc --      Pain Edu? --      Excl. in [place]? --    Updated Vital Signs BP 135/88 (BP Location: Left Arm)   Pulse 90   Temp 97.9 F (36.6 C) (Oral)   Resp 18   Ht 5\' 11"  (1.803 m)   Wt 117.5 kg   SpO2 98%   BMI 36.12 kg/m   Visual Acuity Right Eye Distance:   Left Eye Distance:   Bilateral Distance:    Right Eye Near:   Left Eye Near:    Bilateral Near:     Physical Exam Vitals signs and nursing note reviewed.  Constitutional:      General: He is not in acute distress.    Appearance: Normal appearance.  HENT:     Head: Normocephalic and atraumatic.      Comments: Wound located left leg location.  Patient has brisk bleeding coming directly from the wound. Eyes:     General: No scleral icterus.    Conjunctiva/sclera: Conjunctivae normal.  Cardiovascular:     Rate and Rhythm: Normal rate and regular rhythm.  Pulmonary:     Effort: Pulmonary effort is normal. No respiratory distress.  Neurological:     Mental Status: He is alert.  Psychiatric:        Mood and Affect: Mood normal.        Behavior: Behavior normal.    UC Treatments / Results  Labs (all labs ordered are listed, but only abnormal results are displayed) Labs Reviewed - No data to display  EKG None  Radiology No results found.  Procedures Procedures (including critical care time) Topical LET was applied and bleeding did not subside.  Local injection of lidocaine with epinephrine was done.  2 sutures (5-0 nylon) placed at the site of bleeding to tamponade (performed by Tamala Ser, PA).  After sutures were placed, bleeding subsided.  Wound was dressed with topical antibiotic ointment and a pressure dressing.  Patient tolerated procedure well.  Medications Ordered in UC Medications  acetaminophen (TYLENOL) tablet 1,000 mg (1,000 mg Oral Given 01/10/19  2050)    Initial Impression / Assessment and Plan / UC Course  I have reviewed the triage vital signs and the nursing notes.  Pertinent labs & imaging results that were available during my care of the patient were reviewed by me and considered in my medical decision making (see chart for details).    58 year old male presents with a  bleeding wound.  Bleeding was controlled as outlined above in the procedure section.  Patient to see his dermatologist tomorrow.  Final Clinical Impressions(s) / UC Diagnoses   Final diagnoses:  Bleeding from wound     Discharge Instructions     Leave compressive dress on.  If bleeding recurs, go to the ER.  Take care  Dr. Lacinda Axon    ED Prescriptions    None     Controlled Substance Prescriptions [place] Controlled Substance Registry consulted? Not Applicable   Coral Spikes, DO 01/10/19 2120    Coral Spikes, DO 01/12/19 [phone]

## 2019-01-10 NOTE — ED Triage Notes (Addendum)
Patient in today stating that he had a cyst removed from his left cheek on 01/04/19. Patient states he had a coughing spell and his sutures started bleeding and can't get it to stop. Patient is on [place] for DVT.

## 2019-01-11 ENCOUNTER — Telehealth: Payer: Self-pay

## 2019-01-11 NOTE — Telephone Encounter (Signed)
Patient LM reporting he had cyst removed at [place] office and ended up losing a suture or upsetting the area where he was bleeding at home. He was seen at [facility] and treated (See Note) He wants to know when to start [place] again. Attempted to call x 3 and unable to reach. Note says he was supposed to see Dr.Graham today to f/u given they did sutures. Not sure if he alerted them that he was on blood thinner but advised on VM to go ahead and start tomorrow again on the meds and to ALWAYS alert surgeons and dentists and anyone who should be aware that you are on Blood Thinners. Asked to call us Monday and to call 911/ER if happens again.

## 2019-01-14 ENCOUNTER — Other Ambulatory Visit: Payer: Self-pay

## 2019-01-14 ENCOUNTER — Telehealth: Payer: Self-pay | Admitting: Gastroenterology

## 2019-01-14 DIAGNOSIS — K589 Irritable bowel syndrome without diarrhea: Secondary | ICD-10-CM

## 2019-01-14 MED ORDER — DIPHENOXYLATE-ATROPINE 2.5-0.025 MG PO TABS: 1.0000 | ORAL_TABLET | Freq: Four times a day (QID) | ORAL | 5 refills | Status: DC | PRN

## 2019-01-14 NOTE — Telephone Encounter (Signed)
Left vm letting pt know Lomotil was called into [facility] today, 01/14/19.

## 2019-01-14 NOTE — Telephone Encounter (Signed)
Pt left vm for Ginger to get a refill on Rx Lomotil please call him to let him know if that will go through today

## 2019-01-21 ENCOUNTER — Encounter: Payer: Self-pay | Admitting: Internal Medicine

## 2019-01-21 DIAGNOSIS — I82409 Acute embolism and thrombosis of unspecified deep veins of unspecified lower extremity: Secondary | ICD-10-CM | POA: Insufficient documentation

## 2019-02-01 ENCOUNTER — Ambulatory Visit
Admission: RE | Admit: 2019-02-01 | Discharge: 2019-02-01 | Disposition: A | Discharge status: Home / Self Care | Payer: BLUE CROSS/BLUE SHIELD | Attending: Internal Medicine | Admitting: Internal Medicine

## 2019-02-01 ENCOUNTER — Other Ambulatory Visit: Payer: Self-pay

## 2019-02-01 ENCOUNTER — Ambulatory Visit: Payer: BLUE CROSS/BLUE SHIELD | Admitting: Internal Medicine

## 2019-02-01 ENCOUNTER — Ambulatory Visit
Admission: RE | Admit: 2019-02-01 | Discharge: 2019-02-01 | Disposition: A | Discharge status: Home / Self Care | Payer: BLUE CROSS/BLUE SHIELD | Source: Ambulatory Visit | Attending: Internal Medicine | Admitting: Internal Medicine

## 2019-02-01 ENCOUNTER — Other Ambulatory Visit: Payer: Self-pay | Admitting: Internal Medicine

## 2019-02-01 ENCOUNTER — Encounter: Payer: Self-pay | Admitting: Internal Medicine

## 2019-02-01 VITALS — BP 124/84 | HR 101 | Temp 98.4°F | Resp 16 | Ht 71.0 in | Wt 254.0 lb

## 2019-02-01 DIAGNOSIS — I824Z2 Acute embolism and thrombosis of unspecified deep veins of left distal lower extremity: Secondary | ICD-10-CM

## 2019-02-01 DIAGNOSIS — R05 Cough: Secondary | ICD-10-CM | POA: Diagnosis not present

## 2019-02-01 DIAGNOSIS — R059 Cough, unspecified: Secondary | ICD-10-CM

## 2019-02-01 DIAGNOSIS — L03211 Cellulitis of face: Secondary | ICD-10-CM

## 2019-02-01 DIAGNOSIS — K529 Noninfective gastroenteritis and colitis, unspecified: Secondary | ICD-10-CM

## 2019-02-01 DIAGNOSIS — C4492 Squamous cell carcinoma of skin, unspecified: Secondary | ICD-10-CM

## 2019-02-01 MED ORDER — LEVOFLOXACIN 500 MG PO TABS: 500.0000 mg | ORAL_TABLET | Freq: Every day | ORAL | 0 refills | Status: DC

## 2019-02-01 MED ORDER — RIVAROXABAN 20 MG PO TABS: 20.0000 mg | ORAL_TABLET | Freq: Every day | ORAL | 5 refills | Status: DC

## 2019-02-01 NOTE — Progress Notes (Signed)
Date:  02/01/2019   Name:  Derrick Galvan.   DOB:  03/17/1961   MRN:  [id number]   Chief Complaint: Cough (light cough non productive since Tue PM...tight chest Hx pneumonia in December ) and Coagulation Disorder (needs xaralto)  Cough  This is a new problem. The current episode started in the past 7 days. The problem has been gradually worsening. The problem occurs every few minutes. Associated symptoms include chest pain. Pertinent negatives include no chills, fever, headaches, nasal congestion, shortness of breath or wheezing. His past medical history is significant for pneumonia.   DVT - he had a DVT in his left leg noted in February.  He was started on Xarelto and has done well.  He did have to hold it for the past 6 days for SCCA surgery.  He will resume it today and needs a refill.  We discussed continuing it for 6 months total and then considering tests for coagulation disorder.  Diarrhea - he has IBS and has several recent antibiotics with more diarrhea.  It is watery, yellow without blood or foul odor.  It is slightly better since stopping Doxycycline 2 days ago.  GI was concerned about possible CDiff.  Review of Systems  Constitutional: Negative for chills and fever.  Eyes: Negative for visual disturbance.  Respiratory: Positive for cough and chest tightness. Negative for choking, shortness of breath and wheezing.   Cardiovascular: Positive for chest pain. Negative for palpitations and leg swelling.  Gastrointestinal: Positive for diarrhea. Negative for abdominal pain, blood in stool, constipation and vomiting.  Skin: Positive for wound (SCCA resection).  Neurological: Negative for dizziness, syncope and headaches.  Psychiatric/Behavioral: Negative for dysphoric mood and sleep disturbance. The patient is not nervous/anxious.     Patient Active Problem List   Diagnosis Date Noted  . DVT (deep venous thrombosis) ([place]) 01/21/2019  . Anxiety disorder 12/18/2018  . Rash of  unknown cause 12/18/2018  . Sleep disturbance 12/18/2018  . Heartburn   . Reflux esophagitis   . Benign neoplasm of ascending colon   . Hepatitis B 07/09/2015  . Adaptive colitis 09/19/2013  . Chronic diarrhea 07/31/2013    Allergies  Allergen Reactions  . Azithromycin Hives  . Clindamycin/Lincomycin Diarrhea  . Doxycycline Hives and Diarrhea  . Keflex [Cephalexin] Swelling    Past Surgical History:  Procedure Laterality Date  . COLONOSCOPY  02/15/11   repeat 10 years  . COLONOSCOPY WITH PROPOFOL N/A 08/15/2016   Procedure: COLONOSCOPY WITH PROPOFOL;  Surgeon: Lucilla Lame, MD;  Location: [place];  Service: Endoscopy;  Laterality: N/A;  . ESOPHAGOGASTRODUODENOSCOPY (EGD) WITH PROPOFOL N/A 08/15/2016   Procedure: ESOPHAGOGASTRODUODENOSCOPY (EGD) WITH PROPOFOL;  Surgeon: Lucilla Lame, MD;  Location: [place];  Service: Endoscopy;  Laterality: N/A;  . POLYPECTOMY  08/15/2016   Procedure: POLYPECTOMY;  Surgeon: Lucilla Lame, MD;  Location: [place];  Service: Endoscopy;;    Social History   Tobacco Use  . Smoking status: Never Smoker  . Smokeless tobacco: Never Used  Substance Use Topics  . Alcohol use: Yes    Alcohol/week: 0.0 standard drinks    Comment: occasional  . Drug use: No     Medication list has been reviewed and updated.  Current Meds  Medication Sig  . acetaminophen (TYLENOL) 500 MG tablet Take 1,000 mg by mouth every 6 (six) hours as needed.  . bismuth subsalicylate (PEPTO-BISMOL) 262 MG/15ML suspension Take 30 mLs by mouth every 6 (six) hours as needed.  OTC  . diphenoxylate-atropine (LOMOTIL) 2.5-0.025 MG tablet Take 1 tablet by mouth 4 (four) times daily as needed for diarrhea or loose stools.  . famotidine (PEPCID) 10 MG tablet Take 10 mg by mouth 2 (two) times daily. OTC  . Rivaroxaban (XARELTO STARTER PACK PO) Take by mouth.  . triamcinolone cream (KENALOG) 0.1 % Apply 1 application topically 2 (two) times daily. HIVES from  Azithromy reaction.    PHQ 2/9 Scores 02/01/2019 12/18/2018 11/16/2018  PHQ - 2 Score 0 3 2  PHQ- 9 Score 0 12 6    BP Readings from Last 3 Encounters:  02/01/19 124/84  01/10/19 135/88  12/21/18 128/70    Physical Exam Vitals signs and nursing note reviewed.  Constitutional:      General: He is not in acute distress.    Appearance: Normal appearance. He is well-developed. He is not ill-appearing.  HENT:     Head: Normocephalic and atraumatic.  Neck:     Musculoskeletal: Normal range of motion.  Cardiovascular:     Rate and Rhythm: Normal rate and regular rhythm.     Pulses: Normal pulses.  Pulmonary:     Effort: Pulmonary effort is normal. No accessory muscle usage or respiratory distress.     Breath sounds: Examination of the right-lower field reveals decreased breath sounds. Decreased breath sounds present. No wheezing or rhonchi.  Abdominal:     General: Abdomen is protuberant. Bowel sounds are normal.     Palpations: Abdomen is soft.     Tenderness: There is no abdominal tenderness.  Musculoskeletal: Normal range of motion.     Left lower leg: He exhibits swelling (but no redness or warmth, no pitting edema).  Lymphadenopathy:     Cervical: No cervical adenopathy.  Skin:    General: Skin is warm and dry.     Findings: No rash.     Comments: Dressing on left cheek/jaw area intact Bandaid over wound on left temple  Neurological:     Mental Status: He is alert and oriented to person, place, and time.  Psychiatric:        Behavior: Behavior normal.        Thought Content: Thought content normal.     Wt Readings from Last 3 Encounters:  02/01/19 254 lb (115.2 kg)  01/10/19 259 lb (117.5 kg)  12/21/18 263 lb (119.3 kg)    BP 124/84   Pulse (!) 101   Temp 98.4 F (36.9 C) (Oral)   Resp 16   Ht 5\' 11"  (1.803 m)   Wt 254 lb (115.2 kg)   SpO2 95%   BMI 35.43 kg/m   Assessment and Plan: 1. Cough in adult - DG Chest 2 View; Future  2. Acute deep vein  thrombosis (DVT) of distal vein of left lower extremity (HCC) Continue xarelto 20 mg daily x 5 more months - rivaroxaban (XARELTO) 20 MG TABS tablet; Take 1 tablet (20 mg total) by mouth daily with supper.  Dispense: 30 tablet; Refill: 5  3. Chronic diarrhea Continue probiotics, imodium Consider GI panel if persistent  4. SCCA (squamous cell carcinoma) of skin S/p MOHS surgery   Partially dictated using Editor, commissioning. Any errors are unintentional.  Halina Maidens, MD [facility]  02/01/2019

## 2019-02-01 NOTE — Progress Notes (Signed)
Patient informed. Said he still does not feel well. Got home and temp is 99.9 now. Wants diff abx ( the ones you talked about at appt that are once daily )...   [facility] in [place].

## 2019-02-15 ENCOUNTER — Telehealth: Payer: Self-pay

## 2019-02-15 NOTE — Telephone Encounter (Signed)
Patient calling with discomfort in chest still after finishing Abx. No fever,  No cough, and no SOB. Advised we willc all him back to see what PCP would like to do next. Willing to come in again for OV if needed

## 2019-02-15 NOTE — Telephone Encounter (Signed)
He probably still has some irritation from the previous bronchitis.  As long as he does not have fever, SOB or other worsening symptoms, he should rest and take tylenol if needed.

## 2019-02-15 NOTE — Telephone Encounter (Signed)
Advised 

## 2019-02-18 ENCOUNTER — Other Ambulatory Visit: Payer: Self-pay

## 2019-02-18 ENCOUNTER — Telehealth: Payer: Self-pay | Admitting: Gastroenterology

## 2019-02-18 DIAGNOSIS — K589 Irritable bowel syndrome without diarrhea: Secondary | ICD-10-CM

## 2019-02-18 MED ORDER — DIPHENOXYLATE-ATROPINE 2.5-0.025 MG PO TABS: 1.0000 | ORAL_TABLET | Freq: Four times a day (QID) | ORAL | 5 refills | Status: DC | PRN

## 2019-02-18 NOTE — Telephone Encounter (Signed)
Patient called & l/m on v/m that he needs a refill on diphenoxylate-atropine (LOMOTIL) 2.5-0.025 MG tablet called into [facility].

## 2019-02-18 NOTE — Telephone Encounter (Signed)
Left vm letting pt know I have called in his Lomotil to [facility].

## 2019-02-19 ENCOUNTER — Other Ambulatory Visit: Payer: Self-pay

## 2019-02-19 ENCOUNTER — Encounter: Payer: Self-pay | Admitting: Internal Medicine

## 2019-02-19 ENCOUNTER — Ambulatory Visit: Payer: BLUE CROSS/BLUE SHIELD | Admitting: Internal Medicine

## 2019-02-19 VITALS — BP 120/76 | HR 88 | Temp 97.1°F | Ht 71.0 in | Wt 254.0 lb

## 2019-02-19 DIAGNOSIS — I82432 Acute embolism and thrombosis of left popliteal vein: Secondary | ICD-10-CM | POA: Diagnosis not present

## 2019-02-19 DIAGNOSIS — R0781 Pleurodynia: Secondary | ICD-10-CM

## 2019-02-19 MED ORDER — PREDNISONE 10 MG PO TABS: 10.0000 mg | ORAL_TABLET | ORAL | 0 refills | Status: AC

## 2019-02-19 NOTE — Progress Notes (Signed)
Date:  02/19/2019   Name:  Derrick Galvan.   DOB:  05-21-1961   MRN:  [id number]   Chief Complaint: Cough (Dry Cough, NO SOB. Chest discomfort. Feels the discomfort under rubs. )  Cough  This is a chronic problem. The current episode started 1 to 4 weeks ago. The problem has been unchanged. The problem occurs every few minutes. The cough is non-productive. Associated symptoms include chest pain (lower ribs and upper abdomen fullness) and postnasal drip. Pertinent negatives include no chills, ear congestion, fever, headaches, heartburn, sore throat, shortness of breath, sweats or wheezing. The symptoms are aggravated by lying down. He has tried nothing for the symptoms.  DVT - of left lower leg diagnosed about 6 weeks ago.  He is on xarelto with no bleeding issues.  However, his prescription cost him $300.  He is hoping for a savings card.  CXR 02/01/19: COMPARISON:  December 18, 2018  FINDINGS: There is minimal scarring in the left base. The lungs elsewhere are clear. Heart size and pulmonary vascularity are normal. No adenopathy. No pneumothorax. No bone lesions.  IMPRESSION: Slight scarring left base.  No edema or consolidation.  Review of Systems  Constitutional: Negative for chills and fever.  HENT: Positive for postnasal drip. Negative for sore throat and trouble swallowing.   Respiratory: Positive for cough. Negative for chest tightness, shortness of breath and wheezing.   Cardiovascular: Positive for chest pain (lower ribs and upper abdomen fullness). Negative for palpitations and leg swelling.  Gastrointestinal: Negative for diarrhea, heartburn, nausea and vomiting.  Musculoskeletal: Negative for arthralgias.  Neurological: Negative for dizziness and headaches.  Hematological: Negative for adenopathy.  Psychiatric/Behavioral: Negative for sleep disturbance.    Patient Active Problem List   Diagnosis Date Noted  . SCCA (squamous cell carcinoma) of skin 02/01/2019  .  DVT (deep venous thrombosis) ([place]) 01/21/2019  . Anxiety disorder 12/18/2018  . Rash of unknown cause 12/18/2018  . Sleep disturbance 12/18/2018  . Heartburn   . Reflux esophagitis   . Benign neoplasm of ascending colon   . Hepatitis B 07/09/2015  . Adaptive colitis 09/19/2013  . Chronic diarrhea 07/31/2013    Allergies  Allergen Reactions  . Azithromycin Hives  . Clindamycin/Lincomycin Diarrhea  . Doxycycline Hives and Diarrhea  . Keflex [Cephalexin] Swelling    Past Surgical History:  Procedure Laterality Date  . COLONOSCOPY  02/15/11   repeat 10 years  . COLONOSCOPY WITH PROPOFOL N/A 08/15/2016   Procedure: COLONOSCOPY WITH PROPOFOL;  Surgeon: Lucilla Lame, MD;  Location: [facility];  Service: Endoscopy;  Laterality: N/A;  . ESOPHAGOGASTRODUODENOSCOPY (EGD) WITH PROPOFOL N/A 08/15/2016   Procedure: ESOPHAGOGASTRODUODENOSCOPY (EGD) WITH PROPOFOL;  Surgeon: Lucilla Lame, MD;  Location: [facility];  Service: Endoscopy;  Laterality: N/A;  . POLYPECTOMY  08/15/2016   Procedure: POLYPECTOMY;  Surgeon: Lucilla Lame, MD;  Location: [place];  Service: Endoscopy;;    Social History   Tobacco Use  . Smoking status: Never Smoker  . Smokeless tobacco: Never Used  Substance Use Topics  . Alcohol use: Yes    Alcohol/week: 0.0 standard drinks    Comment: occasional  . Drug use: No     Medication list has been reviewed and updated.  Current Meds  Medication Sig  . acetaminophen (TYLENOL) 500 MG tablet Take 1,000 mg by mouth every 6 (six) hours as needed.  . bismuth subsalicylate (PEPTO-BISMOL) 262 MG/15ML suspension Take 30 mLs by mouth every 6 (six) hours as needed.  OTC  . diphenoxylate-atropine (LOMOTIL) 2.5-0.025 MG tablet Take 1 tablet by mouth 4 (four) times daily as needed for diarrhea or loose stools.  . famotidine (PEPCID) 10 MG tablet Take 10 mg by mouth 2 (two) times daily. OTC  . rivaroxaban (XARELTO) 20 MG TABS tablet Take 1 tablet (20 mg  total) by mouth daily with supper.  . triamcinolone cream (KENALOG) 0.1 % Apply 1 application topically 2 (two) times daily. HIVES from Azithromy reaction.    PHQ 2/9 Scores 02/19/2019 02/01/2019 12/18/2018 11/16/2018  PHQ - 2 Score 0 0 3 2  PHQ- 9 Score 0 0 12 6    BP Readings from Last 3 Encounters:  02/19/19 120/76  02/01/19 124/84  01/10/19 135/88    Physical Exam Vitals signs and nursing note reviewed.  Constitutional:      General: He is not in acute distress.    Appearance: He is well-developed.  HENT:     Head: Normocephalic and atraumatic.     Nose: Nose normal.     Mouth/Throat:     Mouth: Mucous membranes are moist.  Neck:     Musculoskeletal: Normal range of motion and neck supple.  Cardiovascular:     Rate and Rhythm: Normal rate and regular rhythm.     Pulses: Normal pulses.  Pulmonary:     Effort: Pulmonary effort is normal. No respiratory distress.     Breath sounds: Normal breath sounds and air entry. No decreased breath sounds, wheezing or rales.  Chest:     Chest wall: Tenderness (mild tenderness over both lower anterior ribs ) present. No mass, lacerations or swelling. There is no dullness to percussion.     Breasts:        Right: Normal.        Left: Normal.  Abdominal:     General: Abdomen is flat. Bowel sounds are normal.     Palpations: Abdomen is soft.     Tenderness: There is no abdominal tenderness. There is no right CVA tenderness or left CVA tenderness.     Hernia: No hernia is present.  Musculoskeletal: Normal range of motion.  Lymphadenopathy:     Cervical: No cervical adenopathy.  Skin:    General: Skin is warm and dry.     Findings: No rash.  Neurological:     Mental Status: He is alert and oriented to person, place, and time.  Psychiatric:        Attention and Perception: Attention normal.        Mood and Affect: Mood normal.        Behavior: Behavior normal.        Thought Content: Thought content normal.     Wt Readings from  Last 3 Encounters:  02/19/19 254 lb (115.2 kg)  02/01/19 254 lb (115.2 kg)  01/10/19 259 lb (117.5 kg)    BP 120/76   Pulse 88   Temp (!) 97.1 F (36.2 C) (Oral)   Ht 5\' 11"  (1.803 m)   Wt 254 lb (115.2 kg)   SpO2 97%   BMI 35.43 kg/m   Assessment and Plan: 1. Pleuritic chest pain Suspect mild pleuritic pain Would hold advil since on DOAC Prednisone taper - call if not improved - CBC with Differential/Platelet - Comprehensive metabolic panel - predniSONE (DELTASONE) 10 MG tablet; Take 1 tablet (10 mg total) by mouth as directed for 6 days. Take 6,5,4,3,2,1 then stop  Dispense: 21 tablet; Refill: 0  2. Acute deep vein thrombosis (DVT) of  popliteal vein of left lower extremity (HCC) Continue Xarelto x 6 months Savings card provided   Partially dictated using Editor, commissioning. Any errors are unintentional.  Halina Maidens, MD [facility]  02/19/2019

## 2019-02-20 ENCOUNTER — Telehealth: Payer: Self-pay

## 2019-02-20 LAB — COMPREHENSIVE METABOLIC PANEL
ALT: 23 IU/L (ref 0–44)
AST: 18 IU/L (ref 0–40)
Albumin/Globulin Ratio: 1.9 (ref 1.2–2.2)
Albumin: 4.3 g/dL (ref 3.8–4.9)
Alkaline Phosphatase: 72 IU/L (ref 39–117)
BUN/Creatinine Ratio: 19 (ref 9–20)
BUN: 19 mg/dL (ref 6–24)
Bilirubin Total: 1.3 mg/dL — ABNORMAL HIGH (ref 0.0–1.2)
CO2: 24 mmol/L (ref 20–29)
Calcium: 9.7 mg/dL (ref 8.7–10.2)
Chloride: 102 mmol/L (ref 96–106)
Creatinine, Ser: 1.02 mg/dL (ref 0.76–1.27)
GFR calc Af Amer: 94 mL/min/{1.73_m2} (ref >59)
GFR calc non Af Amer: 81 mL/min/{1.73_m2} (ref >59)
Globulin, Total: 2.3 g/dL (ref 1.5–4.5)
Glucose: 91 mg/dL (ref 65–99)
Potassium: 4.6 mmol/L (ref 3.5–5.2)
Sodium: 141 mmol/L (ref 134–144)
Total Protein: 6.6 g/dL (ref 6.0–8.5)

## 2019-02-20 LAB — CBC WITH DIFFERENTIAL/PLATELET
Basophils Absolute: 0.1 10*3/uL (ref 0.0–0.2)
Basos: 1 %
EOS (ABSOLUTE): 0.2 10*3/uL (ref 0.0–0.4)
Eos: 2 %
Hematocrit: 49.8 % (ref 37.5–51.0)
Hemoglobin: 16.5 g/dL (ref 13.0–17.7)
Immature Grans (Abs): 0 10*3/uL (ref 0.0–0.1)
Immature Granulocytes: 0 %
Lymphocytes Absolute: 2.7 10*3/uL (ref 0.7–3.1)
Lymphs: 31 %
MCH: 29.6 pg (ref 26.6–33.0)
MCHC: 33.1 g/dL (ref 31.5–35.7)
MCV: 89 fL (ref 79–97)
Monocytes Absolute: 0.7 10*3/uL (ref 0.1–0.9)
Monocytes: 8 %
Neutrophils Absolute: 5.1 10*3/uL (ref 1.4–7.0)
Neutrophils: 58 %
Platelets: 173 10*3/uL (ref 150–450)
RBC: 5.58 x10E6/uL (ref 4.14–5.80)
RDW: 12.5 % (ref 11.6–15.4)
WBC: 8.8 10*3/uL (ref 3.4–10.8)

## 2019-02-20 NOTE — Telephone Encounter (Signed)
Patient called saying he was seen yesterday for OV. Said he was given Prednisone from visit. Took his first dose this morning with Xarelto and felt light headed shortly after.   Called patient back and reported to him on VM that I spoke with Dr Army Melia and she said for him to take the prednisone at lunch time all at once with food.. Take Xarelto first thing in the morning.  Told him to call back if he has any further questions.

## 2019-02-27 ENCOUNTER — Other Ambulatory Visit: Payer: Self-pay | Admitting: Internal Medicine

## 2019-02-27 ENCOUNTER — Telehealth: Payer: Self-pay

## 2019-02-27 DIAGNOSIS — I82432 Acute embolism and thrombosis of left popliteal vein: Secondary | ICD-10-CM

## 2019-02-27 DIAGNOSIS — R0789 Other chest pain: Secondary | ICD-10-CM

## 2019-02-27 NOTE — Telephone Encounter (Signed)
Patient called saying he has still having chest discomfort after finishing prednisone. Spoke with Dr Army Melia and told patient she said she ordered a STAT Ct to rule out a blood clot in the lungs.  Will call when results are back.

## 2019-02-28 ENCOUNTER — Other Ambulatory Visit: Payer: BLUE CROSS/BLUE SHIELD

## 2019-03-01 ENCOUNTER — Other Ambulatory Visit: Payer: Self-pay

## 2019-03-01 ENCOUNTER — Ambulatory Visit
Admission: RE | Admit: 2019-03-01 | Discharge: 2019-03-01 | Disposition: A | Discharge status: Home / Self Care | Payer: BLUE CROSS/BLUE SHIELD | Source: Ambulatory Visit | Attending: Internal Medicine | Admitting: Internal Medicine

## 2019-03-01 DIAGNOSIS — I82432 Acute embolism and thrombosis of left popliteal vein: Secondary | ICD-10-CM | POA: Diagnosis present

## 2019-03-01 DIAGNOSIS — R0789 Other chest pain: Secondary | ICD-10-CM | POA: Diagnosis present

## 2019-03-01 MED ORDER — IOPAMIDOL (ISOVUE-370) INJECTION 76%
75.0000 mL | Freq: Once | INTRAVENOUS | Status: AC | PRN
  Administered 2019-03-01: 75 mL via INTRAVENOUS

## 2019-03-29 ENCOUNTER — Other Ambulatory Visit: Payer: Self-pay | Admitting: Internal Medicine

## 2019-03-29 ENCOUNTER — Telehealth: Payer: Self-pay

## 2019-03-29 DIAGNOSIS — R0781 Pleurodynia: Secondary | ICD-10-CM

## 2019-03-29 DIAGNOSIS — R0789 Other chest pain: Secondary | ICD-10-CM

## 2019-03-29 NOTE — Telephone Encounter (Signed)
Still feels bad with tight chest and sore in ribs. Wants REF to Pulmonology.

## 2019-04-03 ENCOUNTER — Telehealth: Payer: Self-pay

## 2019-04-03 NOTE — Telephone Encounter (Signed)
Patient called saying he wants to know what his next steps are to do for his discomfort in his chest. He said he still is not feeling well. His job has been calling him to get him to come back to work and he needs to know if he should do this.  I called the patient and informed him that Dr. Army Melia has NO OTHER suggesting's at this time. She did all the tests she knows of to do for this. Told him she placed a referal for pulmonology. Gave him [facility] in [place]'s # to schedule his appt since referral was sent. Told him he is just waiting on them to call to schedule appt. Told him since its possibly a pulmonary issue, the specialist will have to decide next steps. Told him we never took him out of work, and Dr. Army Melia does not believe he should have ever been out of work. He is fine to go back to work.  He verbalized understanding of this.

## 2019-04-16 ENCOUNTER — Telehealth: Payer: Self-pay | Admitting: Gastroenterology

## 2019-04-16 NOTE — Telephone Encounter (Signed)
Pt left vm  For Ginger to get some help with antacids which she feel be more appropriate for him rx omeprazole or rx somidole? Please call pt

## 2019-04-17 NOTE — Telephone Encounter (Signed)
Pt has been scheduled for a virtual visit with Dr. Allen Norris on Wednesday, June 17th.

## 2019-04-24 ENCOUNTER — Ambulatory Visit (INDEPENDENT_AMBULATORY_CARE_PROVIDER_SITE_OTHER): Payer: BLUE CROSS/BLUE SHIELD | Admitting: Gastroenterology

## 2019-04-24 ENCOUNTER — Other Ambulatory Visit: Payer: Self-pay

## 2019-04-24 ENCOUNTER — Encounter: Payer: Self-pay | Admitting: Gastroenterology

## 2019-04-24 DIAGNOSIS — K529 Noninfective gastroenteritis and colitis, unspecified: Secondary | ICD-10-CM | POA: Diagnosis not present

## 2019-04-24 NOTE — Progress Notes (Signed)
Lucilla Lame, MD [address]  [place], [place] [address]  Main: [phone]  Fax: [phone]    Gastroenterology Virtual/Video Visit  Referring Provider:     Glean Hess, MD Primary Care Physician:  Glean Hess, MD Primary Gastroenterologist:  Dr.Masayuki Sakai Allen Norris Reason for Consultation:     Chronic diarrhea        HPI:    Virtual Visit via Video Note Location of the patient: Home Location of provider: Office  Participating persons: The patient myself and Ginger Feldpausch.  I connected with Derrick Galvan. on 04/24/19 at  8:45 AM EDT by a video enabled telemedicine application and verified that I am speaking with the correct person using two identifiers.   I discussed the limitations of evaluation and management by telemedicine and the availability of in person appointments. The patient expressed understanding and agreed to proceed.  Verbal consent to proceed obtained.  History of Present Illness: Derrick Galvan. is a 58 y.o. male referred by Dr. Army Melia, Jesse Sans, MD  for consultation & management of chronic diarrhea.  The patient has seen me in the past for chronic diarrhea.  The patient had a colonoscopy with random biopsies throughout the colon and a polyp removed.  The polyp was found to be an adenomatous polyp and the patient was recommended to have a repeat colonoscopy in 5 years.  This colonoscopy was done in 2017.  At the same time the patient had an upper endoscopy with biopsies of the small bowel and in addition to the normal random colon biopsies, terminal ileal biopsies the duodenal biopsies were also normal.  The patient has been on Lomotil and probiotics. The patient reports that he was on antibiotics recently and that relates to the toll on his system.  The patient states that he is now back to not eating after dinner and avoiding anything that upsets his stomach.  The patient also reports a bandlike bloating feeling at the top of his  stomach.  There is no report of any black stools or bloody stools.    Past Medical History:  Diagnosis Date  . Acute gastritis without hemorrhage   . Adaptive colitis 09/19/2013  . Chronic diarrhea 07/31/2013  . Hepatitis B 1989   resolved  . Skin cancer    on face - removed under local    Past Surgical History:  Procedure Laterality Date  . COLONOSCOPY  02/15/11   repeat 10 years  . COLONOSCOPY WITH PROPOFOL N/A 08/15/2016   Procedure: COLONOSCOPY WITH PROPOFOL;  Surgeon: Lucilla Lame, MD;  Location: [facility];  Service: Endoscopy;  Laterality: N/A;  . ESOPHAGOGASTRODUODENOSCOPY (EGD) WITH PROPOFOL N/A 08/15/2016   Procedure: ESOPHAGOGASTRODUODENOSCOPY (EGD) WITH PROPOFOL;  Surgeon: Lucilla Lame, MD;  Location: [facility];  Service: Endoscopy;  Laterality: N/A;  . POLYPECTOMY  08/15/2016   Procedure: POLYPECTOMY;  Surgeon: Lucilla Lame, MD;  Location: [facility];  Service: Endoscopy;;    Prior to Admission medications   Medication Sig Start Date End Date Taking? Authorizing Provider  acetaminophen (TYLENOL) 500 MG tablet Take 1,000 mg by mouth every 6 (six) hours as needed.    [provider]  bismuth subsalicylate (PEPTO-BISMOL) 262 MG/15ML suspension Take 30 mLs by mouth every 6 (six) hours as needed. OTC    [provider]  diphenoxylate-atropine (LOMOTIL) 2.5-0.025 MG tablet Take 1 tablet by mouth 4 (four) times daily as needed for diarrhea or loose stools. 02/18/19  Lucilla Lame, MD  famotidine (PEPCID) 10 MG tablet Take 10 mg by mouth 2 (two) times daily. OTC    [provider]  rivaroxaban (XARELTO) 20 MG TABS tablet Take 1 tablet (20 mg total) by mouth daily with supper. 02/01/19   Glean Hess, MD  triamcinolone cream (KENALOG) 0.1 % Apply 1 application topically 2 (two) times daily. HIVES from Azithromy reaction.    [provider]    Family History  Problem Relation Age of Onset  . Healthy Mother   . COPD  Father      Social History   Tobacco Use  . Smoking status: Never Smoker  . Smokeless tobacco: Never Used  Substance Use Topics  . Alcohol use: Yes    Alcohol/week: 0.0 standard drinks    Comment: occasional  . Drug use: No    Allergies as of 04/24/2019 - Review Complete 03/01/2019  Allergen Reaction Noted  . Azithromycin Hives 11/08/2018  . Clindamycin/lincomycin Diarrhea 02/01/2019  . Doxycycline Hives and Diarrhea 02/01/2019  . Keflex [cephalexin] Swelling 01/10/2019    Review of Systems:    All systems reviewed and negative except where noted in HPI.   Observations/Objective:  Labs: CBC    Component Value Date/Time   WBC 8.8 02/19/2019 1435   RBC 5.58 02/19/2019 1435   HGB 16.5 02/19/2019 1435   HCT 49.8 02/19/2019 1435   PLT 173 02/19/2019 1435   MCV 89 02/19/2019 1435   MCH 29.6 02/19/2019 1435   MCHC 33.1 02/19/2019 1435   RDW 12.5 02/19/2019 1435   LYMPHSABS 2.7 02/19/2019 1435   EOSABS 0.2 02/19/2019 1435   BASOSABS 0.1 02/19/2019 1435   CMP     Component Value Date/Time   NA 141 02/19/2019 1435   K 4.6 02/19/2019 1435   CL 102 02/19/2019 1435   CO2 24 02/19/2019 1435   GLUCOSE 91 02/19/2019 1435   BUN 19 02/19/2019 1435   CREATININE 1.02 02/19/2019 1435   CALCIUM 9.7 02/19/2019 1435   PROT 6.6 02/19/2019 1435   ALBUMIN 4.3 02/19/2019 1435   AST 18 02/19/2019 1435   ALT 23 02/19/2019 1435   ALKPHOS 72 02/19/2019 1435   BILITOT 1.3 (H) 02/19/2019 1435   GFRNONAA 81 02/19/2019 1435   GFRAA 94 02/19/2019 1435    Imaging Studies: No results found.  Assessment and Plan:   Derrick Galvan. is a 58 y.o. y/o male who is here for follow-up of his irritable bowel syndrome with diarrhea predominance.  The patient has been doing well on different probiotics and states that his most recent one which is align gives him gas.  The patient has been told that he can try VS L #3.  He has also been told that his diet may contribute as well and stressed to  his diarrhea.  The patient reports that he felt better when he was avoiding gluten and he will try to go back on his gluten-free diet.  The patient has been told to use Lomotil and the probiotics in addition to his dietary changes to keep his symptoms under control.  The patient states he understands the plan and agrees with it.   Follow Up Instructions:  I discussed the assessment and treatment plan with the patient. The patient was provided an opportunity to ask questions and all were answered. The patient agreed with the plan and demonstrated an understanding of the instructions.   The patient was advised to call back or seek an in-person evaluation  if the symptoms worsen or if the condition fails to improve as anticipated.  I provided 30 minutes of non-face-to-face time during this encounter.   Lucilla Lame, MD  Speech recognition software was used to dictate the above note.

## 2019-05-08 ENCOUNTER — Ambulatory Visit: Payer: BLUE CROSS/BLUE SHIELD | Admitting: Internal Medicine

## 2019-05-08 ENCOUNTER — Encounter: Payer: Self-pay | Admitting: Internal Medicine

## 2019-05-08 ENCOUNTER — Other Ambulatory Visit: Payer: Self-pay

## 2019-05-08 VITALS — BP 128/76 | HR 77 | Ht 71.0 in | Wt 249.0 lb

## 2019-05-08 DIAGNOSIS — F411 Generalized anxiety disorder: Secondary | ICD-10-CM | POA: Diagnosis not present

## 2019-05-08 DIAGNOSIS — F321 Major depressive disorder, single episode, moderate: Secondary | ICD-10-CM | POA: Diagnosis not present

## 2019-05-08 MED ORDER — ALPRAZOLAM 0.5 MG PO TBDP: 0.5000 mg | ORAL_TABLET | Freq: Two times a day (BID) | ORAL | 0 refills | Status: DC | PRN

## 2019-05-08 MED ORDER — ESCITALOPRAM OXALATE 10 MG PO TABS: 10.0000 mg | ORAL_TABLET | Freq: Every day | ORAL | 1 refills | Status: DC

## 2019-05-08 MED ORDER — ALPRAZOLAM 0.5 MG PO TABS: 0.2500 mg | ORAL_TABLET | Freq: Two times a day (BID) | ORAL | 0 refills | Status: DC | PRN

## 2019-05-08 NOTE — Progress Notes (Addendum)
Date:  05/08/2019   Name:  Derrick Galvan.   DOB:  January 08, 1961   MRN:  [id number]   Chief Complaint: Depression (PHQ9- 21) and Anxiety (GAD7-  21)  Depression        This is a new problem.  The current episode started more than 1 month ago.   The problem has been gradually worsening since onset.  Associated symptoms include decreased concentration, fatigue, insomnia, irritable, restlessness, decreased interest, appetite change, sad and suicidal ideas.  Associated symptoms include no body aches, no myalgias and no headaches.     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  Past treatments include nothing.  Past medical history includes anxiety.   Anxiety Presents for follow-up visit. Symptoms include decreased concentration, insomnia, panic (with bowel upset and chest pain), restlessness and suicidal ideas. Patient reports no chest pain, dizziness, nausea, nervous/anxious behavior or palpitations. Symptoms occur occasionally. The severity of symptoms is causing significant distress.      Review of Systems  Constitutional: Positive for appetite change and fatigue. Negative for unexpected weight change.  Respiratory: Positive for chest tightness.   Cardiovascular: Negative for chest pain, palpitations and leg swelling.  Gastrointestinal: Negative for diarrhea, nausea and vomiting.  Musculoskeletal: Negative for myalgias.  Neurological: Negative for dizziness, tremors, weakness, light-headedness, numbness and headaches.  Hematological: Negative for adenopathy.  Psychiatric/Behavioral: Positive for decreased concentration, depression, dysphoric mood, sleep disturbance and suicidal ideas. The patient has insomnia. The patient is not nervous/anxious.     Patient Active Problem List   Diagnosis Date Noted  . SCCA (squamous cell carcinoma) of skin 02/01/2019  . DVT (deep venous thrombosis) ([place]) 01/21/2019  .  Anxiety disorder 12/18/2018  . Rash of unknown cause 12/18/2018  . Sleep disturbance 12/18/2018  . Heartburn   . Reflux esophagitis   . Benign neoplasm of ascending colon   . Hepatitis B 07/09/2015  . Adaptive colitis 09/19/2013  . Chronic diarrhea 07/31/2013    Allergies  Allergen Reactions  . Azithromycin Hives  . Clindamycin/Lincomycin Diarrhea  . Doxycycline Hives and Diarrhea  . Keflex [Cephalexin] Swelling    Past Surgical History:  Procedure Laterality Date  . COLONOSCOPY  02/15/11   repeat 10 years  . COLONOSCOPY WITH PROPOFOL N/A 08/15/2016   Procedure: COLONOSCOPY WITH PROPOFOL;  Surgeon: Lucilla Lame, MD;  Location: [facility];  Service: Endoscopy;  Laterality: N/A;  . ESOPHAGOGASTRODUODENOSCOPY (EGD) WITH PROPOFOL N/A 08/15/2016   Procedure: ESOPHAGOGASTRODUODENOSCOPY (EGD) WITH PROPOFOL;  Surgeon: Lucilla Lame, MD;  Location: [facility];  Service: Endoscopy;  Laterality: N/A;  . POLYPECTOMY  08/15/2016   Procedure: POLYPECTOMY;  Surgeon: Lucilla Lame, MD;  Location: [facility];  Service: Endoscopy;;    Social History   Tobacco Use  . Smoking status: Never Smoker  . Smokeless tobacco: Never Used  Substance Use Topics  . Alcohol use: Yes    Alcohol/week: 0.0 standard drinks    Comment: occasional  . Drug use: No     Medication list has been reviewed and updated.  Current Meds  Medication Sig  . acetaminophen (TYLENOL) 500 MG tablet Take 1,000 mg by mouth every 6 (six) hours as needed.  . bismuth subsalicylate (PEPTO-BISMOL) 262 MG/15ML suspension Take 30 mLs by mouth every 6 (six) hours as needed. OTC  . diphenoxylate-atropine (LOMOTIL) 2.5-0.025 MG tablet Take 1 tablet by mouth 4 (four) times daily as needed for  diarrhea or loose stools.  . famotidine (PEPCID) 10 MG tablet Take 10 mg by mouth 2 (two) times daily. OTC  . rivaroxaban (XARELTO) 20 MG TABS tablet Take 1 tablet (20 mg total) by mouth daily with supper.    PHQ 2/9  Scores 05/08/2019 02/19/2019 02/01/2019 12/18/2018  PHQ - 2 Score 6 0 0 3  PHQ- 9 Score 21 0 0 12   GAD 7 : Generalized Anxiety Score 05/08/2019 12/18/2018 11/16/2018  Nervous, Anxious, on Edge 3 2 2   Control/stop worrying 3 3 3   Worry too much - different things 3 3 3   Trouble relaxing 3 0 0  Restless 3 0 0  Easily annoyed or irritable 3 3 2   Afraid - awful might happen 3 3 1   Total GAD 7 Score 21 14 11   Anxiety Difficulty Extremely difficult Very difficult Somewhat difficult      BP Readings from Last 3 Encounters:  05/08/19 128/76  02/19/19 120/76  02/01/19 124/84    Physical Exam Vitals signs and nursing note reviewed.  Constitutional:      General: He is irritable. He is not in acute distress.    Appearance: Normal appearance. He is well-developed.  HENT:     Head: Normocephalic and atraumatic.  Eyes:     Pupils: Pupils are equal, round, and reactive to light.  Neck:     Musculoskeletal: Normal range of motion.  Cardiovascular:     Rate and Rhythm: Normal rate and regular rhythm.  Pulmonary:     Effort: Pulmonary effort is normal. No respiratory distress.     Breath sounds: No wheezing or rhonchi.  Musculoskeletal: Normal range of motion.     Right lower leg: No edema.     Left lower leg: Edema present.  Skin:    General: Skin is warm and dry.     Capillary Refill: Capillary refill takes less than 2 seconds.     Findings: No rash.  Neurological:     Mental Status: He is alert and oriented to person, place, and time.  Psychiatric:        Attention and Perception: Attention normal.        Mood and Affect: Mood is depressed.        Speech: Speech normal.        Behavior: Behavior normal.        Thought Content: Thought content includes suicidal (some fleeting thoughts) ideation. Thought content does not include suicidal plan.        Cognition and Memory: Cognition normal.        Judgment: Judgment normal.     Wt Readings from Last 3 Encounters:  05/08/19 249 lb  (112.9 kg)  02/19/19 254 lb (115.2 kg)  02/01/19 254 lb (115.2 kg)    BP 128/76   Pulse 77   Ht 5\' 11"  (1.803 m)   Wt 249 lb (112.9 kg)   SpO2 97%   BMI 34.73 kg/m   Assessment and Plan: 1. Current moderate episode of major depressive disorder without prior episode ([place]) Begin daily medication Follow up in 1 month - escitalopram (LEXAPRO) 10 MG tablet; Take 1 tablet (10 mg total) by mouth daily.  Dispense: 30 tablet; Refill: 1  2. Generalized anxiety disorder Use Xanax 1/2 tablet PRN panic attacks - ALPRAZolam (XANAX) 0.5 MG tablet; Take 0.5 tablets (0.25 mg total) by mouth 2 (two) times daily as needed for anxiety.  Dispense: 20 tablet; Refill: 0   Partially dictated using Editor, commissioning. Any  errors are unintentional.  Halina Maidens, MD [facility]  05/08/2019

## 2019-05-08 NOTE — Patient Instructions (Signed)
Suicidal Feelings: How to Help Yourself Suicide is when you end your own life. There are many things you can do to help yourself feel better when struggling with these feelings. Many services and people are available to support you and others who struggle with similar feelings.  If you ever feel like you may hurt yourself or others, or have thoughts about taking your own life, get help right away. To get help:  Call your local emergency services (911 in the [place]  The [employer]'s health and human services helpline (211 in the [place]  Go to your nearest emergency department.  Call a suicide hotline to speak with a trained counselor. The following suicide hotlines are available in the [place]: ? 1-800-273-TALK (1-800-273-8255). ? 1-800-SUICIDE (1-800-784-2433). ? 1-888-628-9454. This is a hotline for Spanish speakers. ? 1-800-799-4889. This is a hotline for TTY users. ? 1-866-4-U-TREVOR (1-866-488-7386). This is a hotline for lesbian, gay, bisexual, transgender, or questioning youth. ? For a list of hotlines in [place], visit www.suicide.org/hotlines/international/[place]-suicide-hotlines.html  Contact a crisis center or a local suicide prevention center. To find a crisis center or suicide prevention center: ? Call your local hospital, clinic, community service organization, mental health center, social service provider, or health department. Ask for help with connecting to a crisis center. ? For a list of crisis centers in the [place], visit: suicidepreventionlifeline.org ? For a list of crisis centers in [place], visit: suicideprevention.ca How to help yourself feel better   Promise yourself that you will not do anything extreme when you have suicidal feelings. Remember, there is hope. Many people have gotten through suicidal thoughts and feelings, and you can too. If you have had these feelings before, remind yourself that you can get through them again.  Let family, friends,  teachers, or counselors know how you are feeling. Try not to separate yourself from those who care about you and want to help you. Talk with someone every day, even if you do not feel sociable. Face-to-face conversation is best to help them understand your feelings.  Contact a mental health care provider and work with this person regularly.  Make a safety plan that you can follow during a crisis. Include phone numbers of suicide prevention hotlines, mental health professionals, and trusted friends and family members you can call during an emergency. Save these numbers on your phone.  If you are thinking of taking a lot of medicine, give your medicine to someone who can give it to you as prescribed. If you are on antidepressants and are concerned you will overdose, tell your health care provider so that he or she can give you safer medicines.  Try to stick to your routines. Follow a schedule every day. Make self-care a priority.  Make a list of realistic goals, and cross them off when you achieve them. Accomplishments can give you a sense of worth.  Wait until you are feeling better before doing things that you find difficult or unpleasant.  Do things that you have always enjoyed to take your mind off your feelings. Try reading a book, or listening to or playing music. Spending time outside, in nature, may help you feel better. Follow these instructions at home:   Visit your primary health care provider every year for a checkup.  Work with a mental health care provider as needed.  Eat a well-balanced diet, and eat regular meals.  Get plenty of rest.  Exercise if you are able. Just 30 minutes of exercise each day can   help you feel better.  Take over-the-counter and prescription medicines only as told by your health care provider. Ask your mental health care provider about the possible side effects of any medicines you are taking.  Do not use alcohol or drugs, and remove these substances  from your home.  Remove weapons, poisons, knives, and other deadly items from your home. General recommendations  Keep your living space well lit.  When you are feeling well, write yourself a letter with tips and support that you can read when you are not feeling well.  Remember that life's difficulties can be sorted out with help. Conditions can be treated, and you can learn behaviors and ways of thinking that will help you. Where to find more information  National Suicide Prevention Lifeline: www.suicidepreventionlifeline.org  Hopeline: www.hopeline.[place] for Suicide Prevention: PromotionalLoans.co.za  [employer] (for lesbian, gay, bisexual, transgender, or questioning youth): www.thetrevorproject.org Contact a health care provider if:  You feel as though you are a burden to others.  You feel agitated, angry, vengeful, or have extreme mood swings.  You have withdrawn from family and friends. Get help right away if:  You are talking about suicide or wishing to die.  You start making plans for how to commit suicide.  You feel that you have no reason to live.  You start making plans for putting your affairs in order, saying goodbye, or giving your possessions away.  You feel guilt, shame, or unbearable pain, and it seems like there is no way out.  You are frequently using drugs or alcohol.  You are engaging in risky behaviors that could lead to death. If you have any of these symptoms, get help right away. Call emergency services, go to your nearest emergency department or crisis center, or call a suicide crisis helpline. Summary  Suicide is when you take your own life.  Promise yourself that you will not do anything extreme when you have suicidal feelings.  Let family, friends, teachers, or counselors know how you are feeling.  Get help right away if you feel as though life is getting too tough to handle and you are thinking about suicide. This  information is not intended to replace advice given to you by your health care provider. Make sure you discuss any questions you have with your health care provider. Document Released: 04/30/2003 Document Revised: 02/14/2019 Document Reviewed: 06/06/2017 Elsevier Patient Education  2020 [employer].

## 2019-05-14 ENCOUNTER — Telehealth: Payer: Self-pay

## 2019-05-14 NOTE — Telephone Encounter (Signed)
Pt left VM. Having side effects of diarrhea. Had episode last night of tingling, burning, numbness throughout body. No longer present at this time.   Told him this is possible side effect to medication but not a allergic reaction. Continue to take 1/2 tablet/ 5mg  of Lexapro for 10 more days. Then increase to 10 mg if he has no more symptoms.  If diarrhea has not subsided in the next 4 days or if he has another episode of burning, and tingling through out body told patient to call me back and let me know so I can discuss with Dr B.

## 2019-05-16 ENCOUNTER — Telehealth: Payer: Self-pay

## 2019-05-16 ENCOUNTER — Other Ambulatory Visit: Payer: Self-pay | Admitting: *Deleted

## 2019-05-16 DIAGNOSIS — Z20822 Contact with and (suspected) exposure to covid-19: Secondary | ICD-10-CM

## 2019-05-16 NOTE — Telephone Encounter (Signed)
Patient called with concerns of "flu like symptoms." Last night he had a headache, with what felt like a tempeture to him. He did not take his tempeture because it causes him anxiety. He took tlenol, and then broke out in a sweat.  Went today to be tested for Covid. It will show up in Epic once results are back. Pt asked if this could be side effects of Lexapro.  Informed pt Dr. Army Melia said this is not likely symptoms of medication. Wait for test results, but told him his body is going through change with medication and it will take time to get used to it.  He verbalized understanding. Will call the office if his results are negative.

## 2019-05-21 ENCOUNTER — Telehealth: Payer: Self-pay

## 2019-05-21 LAB — NOVEL CORONAVIRUS, NAA: SARS-CoV-2, NAA: NOT DETECTED

## 2019-05-21 NOTE — Telephone Encounter (Signed)
Patient called saying he is still taking 1/2 tablet of Lexapro. He received his Covid results back and they were Negative. He said he has an occasional headache and a lot of gas. He is now fever free. Should he try to up his dose back to 1 tab or wait a little longer?  Please Advise.

## 2019-05-21 NOTE — Telephone Encounter (Signed)
Called and spoke with pt. Informed. He said he will start the increase to 1 full tablet tomorrow.

## 2019-05-21 NOTE — Telephone Encounter (Signed)
If the headache is much better and not occurring daily, then he can go ahead and increase the dose to 10 mg.

## 2019-06-19 ENCOUNTER — Other Ambulatory Visit: Payer: Self-pay

## 2019-06-19 ENCOUNTER — Ambulatory Visit: Payer: BLUE CROSS/BLUE SHIELD | Admitting: Internal Medicine

## 2019-06-19 ENCOUNTER — Encounter: Payer: Self-pay | Admitting: Internal Medicine

## 2019-06-19 VITALS — BP 118/78 | HR 73 | Ht 71.0 in | Wt 243.0 lb

## 2019-06-19 DIAGNOSIS — F411 Generalized anxiety disorder: Secondary | ICD-10-CM | POA: Diagnosis not present

## 2019-06-19 DIAGNOSIS — I82432 Acute embolism and thrombosis of left popliteal vein: Secondary | ICD-10-CM

## 2019-06-19 NOTE — Patient Instructions (Signed)
Increase Lexapro to 15 mg per day.

## 2019-06-19 NOTE — Progress Notes (Signed)
Date:  06/19/2019   Name:  Derrick Galvan.   DOB:  1961-04-30   MRN:  [id number]   Chief Complaint: Depression (Been taking lexapro for 1 month. PHQ9- 9 GAD7- 14)  Depression        This is a new problem.  The current episode started more than 1 month ago.   The onset quality is undetermined.   The problem has been gradually improving since onset.  Associated symptoms include no decreased concentration and no fatigue.  Past treatments include SSRIs - Selective serotonin reuptake inhibitors (lexapro started last visit along with PRN xanax).  Compliance with treatment is good.  Previous treatment provided moderate relief. He feels like he is slowly improving.  He has dealt with the stress of his mother moving here from [place].  He has a new job that allows for better social distancing which has relaxed him.  His chronic chest pain and SOB is much better with management of his anxiety disorder.  Review of Systems  Constitutional: Negative for chills, fatigue, fever and unexpected weight change.  Respiratory: Negative for chest tightness, shortness of breath and wheezing.   Cardiovascular: Negative for chest pain and palpitations.  Gastrointestinal: Negative for constipation, nausea and vomiting.  Psychiatric/Behavioral: Positive for depression and dysphoric mood. Negative for agitation, decreased concentration, self-injury and sleep disturbance. The patient is not nervous/anxious.     Patient Active Problem List   Diagnosis Date Noted  . SCCA (squamous cell carcinoma) of skin 02/01/2019  . DVT (deep venous thrombosis) ([place]) 01/21/2019  . Anxiety disorder 12/18/2018  . Rash of unknown cause 12/18/2018  . Sleep disturbance 12/18/2018  . Heartburn   . Reflux esophagitis   . Benign neoplasm of ascending colon   . Hepatitis B 07/09/2015  . Adaptive colitis 09/19/2013  . Chronic diarrhea 07/31/2013    Allergies  Allergen Reactions  . Azithromycin Hives  . Clindamycin/Lincomycin  Diarrhea  . Doxycycline Hives and Diarrhea  . Keflex [Cephalexin] Swelling    Past Surgical History:  Procedure Laterality Date  . COLONOSCOPY  02/15/11   repeat 10 years  . COLONOSCOPY WITH PROPOFOL N/A 08/15/2016   Procedure: COLONOSCOPY WITH PROPOFOL;  Surgeon: Lucilla Lame, MD;  Location: [facility];  Service: Endoscopy;  Laterality: N/A;  . ESOPHAGOGASTRODUODENOSCOPY (EGD) WITH PROPOFOL N/A 08/15/2016   Procedure: ESOPHAGOGASTRODUODENOSCOPY (EGD) WITH PROPOFOL;  Surgeon: Lucilla Lame, MD;  Location: [facility];  Service: Endoscopy;  Laterality: N/A;  . POLYPECTOMY  08/15/2016   Procedure: POLYPECTOMY;  Surgeon: Lucilla Lame, MD;  Location: [facility];  Service: Endoscopy;;    Social History   Tobacco Use  . Smoking status: Never Smoker  . Smokeless tobacco: Never Used  Substance Use Topics  . Alcohol use: Yes    Alcohol/week: 0.0 standard drinks    Comment: occasional  . Drug use: No     Medication list has been reviewed and updated.  Current Meds  Medication Sig  . acetaminophen (TYLENOL) 500 MG tablet Take 1,000 mg by mouth every 6 (six) hours as needed.  . ALPRAZolam (XANAX) 0.5 MG tablet Take 0.5 tablets (0.25 mg total) by mouth 2 (two) times daily as needed for anxiety.  . bismuth subsalicylate (PEPTO-BISMOL) 262 MG/15ML suspension Take 30 mLs by mouth every 6 (six) hours as needed. OTC  . diphenoxylate-atropine (LOMOTIL) 2.5-0.025 MG tablet Take 1 tablet by mouth 4 (four) times daily as needed for diarrhea or loose stools.  Marland Kitchen escitalopram (LEXAPRO) 10 MG tablet  Take 1 tablet (10 mg total) by mouth daily.  . famotidine (PEPCID) 10 MG tablet Take 10 mg by mouth 2 (two) times daily. OTC  . rivaroxaban (XARELTO) 20 MG TABS tablet Take 1 tablet (20 mg total) by mouth daily with supper.  . triamcinolone cream (KENALOG) 0.1 % Apply 1 application topically 2 (two) times daily. HIVES from Azithromy reaction.    PHQ 2/9 Scores 06/19/2019 05/08/2019  02/19/2019 02/01/2019  PHQ - 2 Score 1 6 0 0  PHQ- 9 Score 9 21 0 0   GAD 7 : Generalized Anxiety Score 06/19/2019 05/08/2019 12/18/2018 11/16/2018  Nervous, Anxious, on Edge 2 3 2 2   Control/stop worrying 3 3 3 3   Worry too much - different things 3 3 3 3   Trouble relaxing 0 3 0 0  Restless 0 3 0 0  Easily annoyed or irritable 3 3 3 2   Afraid - awful might happen 3 3 3 1   Total GAD 7 Score 14 21 14 11   Anxiety Difficulty Somewhat difficult Extremely difficult Very difficult Somewhat difficult    BP Readings from Last 3 Encounters:  06/19/19 118/78  05/08/19 128/76  02/19/19 120/76    Physical Exam Vitals signs and nursing note reviewed.  Constitutional:      General: He is not in acute distress.    Appearance: He is well-developed.  HENT:     Head: Normocephalic and atraumatic.  Neck:     Musculoskeletal: Normal range of motion.  Cardiovascular:     Rate and Rhythm: Normal rate and regular rhythm.     Pulses: Normal pulses.     Heart sounds: No murmur.  Pulmonary:     Effort: Pulmonary effort is normal. No respiratory distress.  Musculoskeletal: Normal range of motion.     Right lower leg: Edema (trace edema) present.     Left lower leg: Edema (trace edema) present.  Lymphadenopathy:     Cervical: No cervical adenopathy.  Skin:    General: Skin is warm and dry.     Capillary Refill: Capillary refill takes less than 2 seconds.     Findings: No rash.  Neurological:     Mental Status: He is alert and oriented to person, place, and time.  Psychiatric:        Behavior: Behavior normal.        Thought Content: Thought content normal.     Wt Readings from Last 3 Encounters:  06/19/19 243 lb (110.2 kg)  05/08/19 249 lb (112.9 kg)  02/19/19 254 lb (115.2 kg)    BP 118/78   Pulse 73   Ht 5\' 11"  (1.803 m)   Wt 243 lb (110.2 kg)   SpO2 96%   BMI 33.89 kg/m   Assessment and Plan: 1. Generalized anxiety disorder He is clinically much improved - depressive sx are  better and panic attack are less He will increase lexapro to 15 mg and call for a new Rx if helpful Urged to take xanax as needed, esp for panic attack that wake him from sleep Follow up in 2 months and PRN  2. Acute deep vein thrombosis (DVT) of popliteal vein of left lower extremity ([place]) Continue DOAC for a total of 9 months - to end in November Mild edema is unchanged and not worrisome for recurrent DVT Pt to manage minor bleeding by avoidance of trauma and local care if needed   Partially dictated using Editor, commissioning. Any errors are unintentional.  Halina Maidens, MD [facility]  [facility]  06/19/2019

## 2019-07-04 ENCOUNTER — Other Ambulatory Visit: Payer: Self-pay | Admitting: Internal Medicine

## 2019-07-04 ENCOUNTER — Telehealth: Payer: Self-pay

## 2019-07-04 DIAGNOSIS — F321 Major depressive disorder, single episode, moderate: Secondary | ICD-10-CM

## 2019-07-04 MED ORDER — ESCITALOPRAM OXALATE 10 MG PO TABS: 15.0000 mg | ORAL_TABLET | Freq: Every day | ORAL | 5 refills | Status: DC

## 2019-07-04 NOTE — Telephone Encounter (Signed)
Patient called saying he is supposed to call when he is about out of Lexapro so he can get an increased Rx for Lexapro sent in to his pharmacy.   Please advise? Currently taking 10 mg daily.

## 2019-07-04 NOTE — Telephone Encounter (Signed)
New dose of 15 mg sent to his pharmacy.

## 2019-07-05 NOTE — Telephone Encounter (Signed)
Called and informed patient of medication sent on VM.

## 2019-07-16 ENCOUNTER — Other Ambulatory Visit: Payer: Self-pay | Admitting: Internal Medicine

## 2019-07-16 ENCOUNTER — Telehealth: Payer: Self-pay

## 2019-07-16 DIAGNOSIS — F411 Generalized anxiety disorder: Secondary | ICD-10-CM

## 2019-07-16 DIAGNOSIS — I824Z2 Acute embolism and thrombosis of unspecified deep veins of left distal lower extremity: Secondary | ICD-10-CM

## 2019-07-16 MED ORDER — ALPRAZOLAM 0.5 MG PO TABS: 0.2500 mg | ORAL_TABLET | Freq: Two times a day (BID) | ORAL | 0 refills | Status: DC | PRN

## 2019-07-16 NOTE — Telephone Encounter (Signed)
Patient called requesting RF for Xanax. Derrick Galvan in [place].  Please advise.

## 2019-08-22 ENCOUNTER — Encounter: Payer: Self-pay | Admitting: Internal Medicine

## 2019-08-22 ENCOUNTER — Ambulatory Visit (INDEPENDENT_AMBULATORY_CARE_PROVIDER_SITE_OTHER): Payer: BLUE CROSS/BLUE SHIELD | Admitting: Internal Medicine

## 2019-08-22 ENCOUNTER — Other Ambulatory Visit: Payer: Self-pay

## 2019-08-22 VITALS — BP 128/78 | HR 75 | Ht 71.0 in | Wt 253.0 lb

## 2019-08-22 DIAGNOSIS — I82432 Acute embolism and thrombosis of left popliteal vein: Secondary | ICD-10-CM | POA: Diagnosis not present

## 2019-08-22 DIAGNOSIS — Z23 Encounter for immunization: Secondary | ICD-10-CM | POA: Diagnosis not present

## 2019-08-22 DIAGNOSIS — K2100 Gastro-esophageal reflux disease with esophagitis, without bleeding: Secondary | ICD-10-CM

## 2019-08-22 DIAGNOSIS — Z1159 Encounter for screening for other viral diseases: Secondary | ICD-10-CM | POA: Diagnosis not present

## 2019-08-22 DIAGNOSIS — Z0000 Encounter for general adult medical examination without abnormal findings: Secondary | ICD-10-CM

## 2019-08-22 DIAGNOSIS — F411 Generalized anxiety disorder: Secondary | ICD-10-CM | POA: Diagnosis not present

## 2019-08-22 DIAGNOSIS — Z125 Encounter for screening for malignant neoplasm of prostate: Secondary | ICD-10-CM

## 2019-08-22 LAB — POCT URINALYSIS DIPSTICK
Bilirubin, UA: NEGATIVE
Blood, UA: NEGATIVE
Glucose, UA: NEGATIVE
Ketones, UA: NEGATIVE
Leukocytes, UA: NEGATIVE
Nitrite, UA: NEGATIVE
Protein, UA: NEGATIVE
Spec Grav, UA: 1.01 (ref 1.010–1.025)
Urobilinogen, UA: 0.2 E.U./dL
pH, UA: 6.5 (ref 5.0–8.0)

## 2019-08-22 MED ORDER — ALPRAZOLAM 0.5 MG PO TABS: 0.2500 mg | ORAL_TABLET | Freq: Two times a day (BID) | ORAL | 2 refills | Status: DC | PRN

## 2019-08-22 NOTE — Progress Notes (Signed)
Date:  08/22/2019   Name:  Derrick Galvan.   DOB:  12-09-1960   MRN:  [id number]   Chief Complaint: Annual Exam (Flu shot.) Derrick Galvan. is a 58 y.o. male who presents today for his Complete Annual Exam. He feels fairly well. He reports exercising some. He reports he is sleeping fairly well.   Colonoscopy 2017 Hep C needed Tdap due  Gastroesophageal Reflux He complains of belching and heartburn. He reports no abdominal pain, no chest pain (mild intermittent recurrance of chest pains), no choking or no wheezing. This is a recurrent problem. The problem occurs occasionally. Pertinent negatives include no fatigue. He has tried a histamine-2 antagonist and a PPI (alternated omeprazole and pepcid) for the symptoms. The treatment provided moderate relief.  Anxiety Presents for follow-up visit. Symptoms include nervous/anxious behavior. Patient reports no chest pain (mild intermittent recurrance of chest pains), dizziness, palpitations or shortness of breath. Symptoms occur occasionally. Current severity: much improved on zoloft and prn xanax.    DVT - diagnosed in February 2020 - on Xarelto since that time.  He denies bleeding issues - need to stop medication and have follow up in 8 weeks for coagulation disorder testing.  The DVT was his first and was unprovoked.  Review of Systems  Constitutional: Negative for appetite change, chills, diaphoresis, fatigue and unexpected weight change.  HENT: Negative for hearing loss, tinnitus, trouble swallowing and voice change.   Eyes: Negative for visual disturbance.  Respiratory: Negative for choking, shortness of breath and wheezing.   Cardiovascular: Negative for chest pain (mild intermittent recurrance of chest pains), palpitations and leg swelling.  Gastrointestinal: Positive for heartburn. Negative for abdominal pain, blood in stool, constipation and diarrhea.  Genitourinary: Negative for difficulty urinating, dysuria, frequency, hematuria  and urgency.  Musculoskeletal: Negative for arthralgias, back pain and myalgias.  Skin: Negative for color change and rash.  Allergic/Immunologic: Positive for environmental allergies.  Neurological: Negative for dizziness, syncope and headaches.  Hematological: Negative for adenopathy.  Psychiatric/Behavioral: Negative for dysphoric mood and sleep disturbance. The patient is nervous/anxious.     Patient Active Problem List   Diagnosis Date Noted  . Generalized anxiety disorder 08/22/2019  . Gastroesophageal reflux disease with esophagitis 08/22/2019  . SCCA (squamous cell carcinoma) of skin 02/01/2019  . DVT (deep venous thrombosis) ([place]) 01/21/2019  . Rash of unknown cause 12/18/2018  . Sleep disturbance 12/18/2018  . Heartburn   . Benign neoplasm of ascending colon   . Hepatitis B 07/09/2015  . Adaptive colitis 09/19/2013  . Chronic diarrhea 07/31/2013    Allergies  Allergen Reactions  . Azithromycin Hives  . Clindamycin/Lincomycin Diarrhea  . Doxycycline Hives and Diarrhea  . Keflex [Cephalexin] Swelling    Past Surgical History:  Procedure Laterality Date  . COLONOSCOPY  02/15/11   repeat 10 years  . COLONOSCOPY WITH PROPOFOL N/A 08/15/2016   Procedure: COLONOSCOPY WITH PROPOFOL;  Surgeon: Lucilla Lame, MD;  Location: [place];  Service: Endoscopy;  Laterality: N/A;  . ESOPHAGOGASTRODUODENOSCOPY (EGD) WITH PROPOFOL N/A 08/15/2016   Procedure: ESOPHAGOGASTRODUODENOSCOPY (EGD) WITH PROPOFOL;  Surgeon: Lucilla Lame, MD;  Location: [place];  Service: Endoscopy;  Laterality: N/A;  . POLYPECTOMY  08/15/2016   Procedure: POLYPECTOMY;  Surgeon: Lucilla Lame, MD;  Location: [place];  Service: Endoscopy;;    Social History   Tobacco Use  . Smoking status: Never Smoker  . Smokeless tobacco: Never Used  Substance Use Topics  . Alcohol use: Yes  Alcohol/week: 0.0 standard drinks    Comment: occasional  . Drug use: No     Medication list  has been reviewed and updated.  Current Meds  Medication Sig  . acetaminophen (TYLENOL) 500 MG tablet Take 1,000 mg by mouth every 6 (six) hours as needed.  . ALPRAZolam (XANAX) 0.5 MG tablet Take 0.5 tablets (0.25 mg total) by mouth 2 (two) times daily as needed for anxiety.  . bismuth subsalicylate (PEPTO-BISMOL) 262 MG/15ML suspension Take 30 mLs by mouth every 6 (six) hours as needed. OTC  . diphenoxylate-atropine (LOMOTIL) 2.5-0.025 MG tablet Take 1 tablet by mouth 4 (four) times daily as needed for diarrhea or loose stools.  Marland Kitchen escitalopram (LEXAPRO) 10 MG tablet Take 1.5 tablets (15 mg total) by mouth daily.  . famotidine (PEPCID) 10 MG tablet Take 10 mg by mouth 2 (two) times daily. OTC  . triamcinolone cream (KENALOG) 0.1 % Apply 1 application topically 2 (two) times daily. HIVES from Azithromy reaction.  Alveda Reasons 20 MG TABS tablet TAKE (1) TABLET BY MOUTH EVERY DAY WITH SUPPER    PHQ 2/9 Scores 08/22/2019 06/19/2019 05/08/2019 02/19/2019  PHQ - 2 Score 0 1 6 0  PHQ- 9 Score 5 9 21  0   GAD 7 : Generalized Anxiety Score 08/22/2019 06/19/2019 05/08/2019 12/18/2018  Nervous, Anxious, on Edge 1 2 3 2   Control/stop worrying 1 3 3 3   Worry too much - different things 1 3 3 3   Trouble relaxing 0 0 3 0  Restless 0 0 3 0  Easily annoyed or irritable 1 3 3 3   Afraid - awful might happen 1 3 3 3   Total GAD 7 Score 5 14 21 14   Anxiety Difficulty Not difficult at all Somewhat difficult Extremely difficult Very difficult      BP Readings from Last 3 Encounters:  08/22/19 128/78  06/19/19 118/78  05/08/19 128/76    Physical Exam Vitals signs and nursing note reviewed.  Constitutional:      General: He is not in acute distress.    Appearance: Normal appearance. He is well-developed.  HENT:     Head: Normocephalic and atraumatic.     Right Ear: Tympanic membrane, ear canal and external ear normal.     Left Ear: Tympanic membrane, ear canal and external ear normal.     Nose: Nose  normal.     Mouth/Throat:     Pharynx: Uvula midline.  Eyes:     Conjunctiva/sclera: Conjunctivae normal.     Pupils: Pupils are equal, round, and reactive to light.  Neck:     Musculoskeletal: Normal range of motion and neck supple.     Thyroid: No thyromegaly.     Vascular: No carotid bruit.  Cardiovascular:     Rate and Rhythm: Normal rate and regular rhythm.     Heart sounds: Normal heart sounds.  Pulmonary:     Effort: Pulmonary effort is normal. No respiratory distress.     Breath sounds: Normal breath sounds. No wheezing.  Chest:     Breasts:        Right: No mass.        Left: No mass.  Abdominal:     General: Bowel sounds are normal.     Palpations: Abdomen is soft.     Tenderness: There is no abdominal tenderness.  Musculoskeletal: Normal range of motion.  Lymphadenopathy:     Cervical: No cervical adenopathy.  Skin:    General: Skin is warm and dry.  Findings: No rash.  Neurological:     Mental Status: He is alert and oriented to person, place, and time.     Deep Tendon Reflexes: Reflexes are normal and symmetric.  Psychiatric:        Speech: Speech normal.        Behavior: Behavior normal.        Thought Content: Thought content normal.        Judgment: Judgment normal.     Wt Readings from Last 3 Encounters:  08/22/19 253 lb (114.8 kg)  06/19/19 243 lb (110.2 kg)  05/08/19 249 lb (112.9 kg)    BP 128/78   Pulse 75   Ht 5\' 11"  (1.803 m)   Wt 253 lb (114.8 kg)   SpO2 98%   BMI 35.29 kg/m   Assessment and Plan: 1. Annual physical exam Normal exam Continue healthy diet, exercise recommended - Comprehensive metabolic panel - Lipid panel - POCT urinalysis dipstick  2. Prostate cancer screening DRE deferred to lack of symptoms Prostatic calcifications seen on abd CT in 2014 - PSA  3. Need for hepatitis C screening test - Hepatitis C antibody  4. Acute deep vein thrombosis (DVT) of popliteal vein of left lower extremity ([facility]) Stop  Xarelto Return in 2 months for coagulation testing  5. Generalized anxiety disorder Doing very well now on Lexapro daily and PRN xanax No SI/HI, working full time - ALPRAZolam (XANAX) 0.5 MG tablet; Take 0.5 tablets (0.25 mg total) by mouth 2 (two) times daily as needed for anxiety.  Dispense: 20 tablet; Refill: 2  6. Gastroesophageal reflux disease with esophagitis, unspecified whether hemorrhage Mild increase in abdominal issues - gerd, bloating, growling Improved with omeprazole Pt with probably IBS - continue to eliminate gluten/dairy from diet; consult Dr. Allen Norris again if needed - CBC with Differential/Platelet  7. Need for diphtheria-tetanus-pertussis (Tdap) vaccine - Tdap vaccine greater than or equal to 7yo IM   Partially dictated using Editor, commissioning. Any errors are unintentional.  Halina Maidens, MD [facility]  08/22/2019

## 2019-08-23 LAB — CBC WITH DIFFERENTIAL/PLATELET
Basophils Absolute: 0.1 10*3/uL (ref 0.0–0.2)
Basos: 1 %
EOS (ABSOLUTE): 0.3 10*3/uL (ref 0.0–0.4)
Eos: 5 %
Hematocrit: 49.3 % (ref 37.5–51.0)
Hemoglobin: 17.1 g/dL (ref 13.0–17.7)
Immature Grans (Abs): 0 10*3/uL (ref 0.0–0.1)
Immature Granulocytes: 0 %
Lymphocytes Absolute: 2.7 10*3/uL (ref 0.7–3.1)
Lymphs: 39 %
MCH: 31 pg (ref 26.6–33.0)
MCHC: 34.7 g/dL (ref 31.5–35.7)
MCV: 89 fL (ref 79–97)
Monocytes Absolute: 0.6 10*3/uL (ref 0.1–0.9)
Monocytes: 8 %
Neutrophils Absolute: 3.2 10*3/uL (ref 1.4–7.0)
Neutrophils: 47 %
Platelets: 150 10*3/uL (ref 150–450)
RBC: 5.52 x10E6/uL (ref 4.14–5.80)
RDW: 11.9 % (ref 11.6–15.4)
WBC: 7 10*3/uL (ref 3.4–10.8)

## 2019-08-23 LAB — HEPATITIS C ANTIBODY: Hep C Virus Ab: <0.1 s/co ratio (ref 0.0–0.9)

## 2019-08-23 LAB — COMPREHENSIVE METABOLIC PANEL
ALT: 24 IU/L (ref 0–44)
AST: 23 IU/L (ref 0–40)
Albumin/Globulin Ratio: 1.8 (ref 1.2–2.2)
Albumin: 4.2 g/dL (ref 3.8–4.9)
Alkaline Phosphatase: 80 IU/L (ref 39–117)
BUN/Creatinine Ratio: 18 (ref 9–20)
BUN: 21 mg/dL (ref 6–24)
Bilirubin Total: 1.4 mg/dL — ABNORMAL HIGH (ref 0.0–1.2)
CO2: 25 mmol/L (ref 20–29)
Calcium: 9.2 mg/dL (ref 8.7–10.2)
Chloride: 102 mmol/L (ref 96–106)
Creatinine, Ser: 1.18 mg/dL (ref 0.76–1.27)
GFR calc Af Amer: 79 mL/min/{1.73_m2} (ref >59)
GFR calc non Af Amer: 68 mL/min/{1.73_m2} (ref >59)
Globulin, Total: 2.4 g/dL (ref 1.5–4.5)
Glucose: 92 mg/dL (ref 65–99)
Potassium: 4.9 mmol/L (ref 3.5–5.2)
Sodium: 138 mmol/L (ref 134–144)
Total Protein: 6.6 g/dL (ref 6.0–8.5)

## 2019-08-23 LAB — LIPID PANEL
Chol/HDL Ratio: 4.1 ratio (ref 0.0–5.0)
Cholesterol, Total: 191 mg/dL (ref 100–199)
HDL: 47 mg/dL (ref >39)
LDL Chol Calc (NIH): 125 mg/dL — ABNORMAL HIGH (ref 0–99)
Triglycerides: 102 mg/dL (ref 0–149)
VLDL Cholesterol Cal: 19 mg/dL (ref 5–40)

## 2019-08-23 LAB — PSA: Prostate Specific Ag, Serum: 0.8 ng/mL (ref 0.0–4.0)

## 2019-10-12 ENCOUNTER — Other Ambulatory Visit: Payer: Self-pay | Admitting: Gastroenterology

## 2019-10-12 DIAGNOSIS — K589 Irritable bowel syndrome without diarrhea: Secondary | ICD-10-CM

## 2019-10-14 ENCOUNTER — Other Ambulatory Visit: Payer: Self-pay

## 2019-10-14 DIAGNOSIS — K589 Irritable bowel syndrome without diarrhea: Secondary | ICD-10-CM

## 2019-10-14 MED ORDER — DIPHENOXYLATE-ATROPINE 2.5-0.025 MG PO TABS: 1.0000 | ORAL_TABLET | Freq: Four times a day (QID) | ORAL | 5 refills | Status: DC | PRN

## 2019-10-18 ENCOUNTER — Telehealth: Payer: Self-pay | Admitting: Gastroenterology

## 2019-10-18 NOTE — Telephone Encounter (Signed)
Pt is  Calling he needs a refill on rx Lamodile send to [facility] in [place]

## 2019-10-18 NOTE — Telephone Encounter (Signed)
Advised pt Lomotil was called into [facility] Monday, 10/14/19. Confirmed with pharmacy they had it. Pt notified.

## 2019-11-12 ENCOUNTER — Ambulatory Visit (INDEPENDENT_AMBULATORY_CARE_PROVIDER_SITE_OTHER): Payer: 59 | Admitting: Gastroenterology

## 2019-11-12 ENCOUNTER — Encounter: Payer: Self-pay | Admitting: Gastroenterology

## 2019-11-12 ENCOUNTER — Other Ambulatory Visit: Payer: Self-pay

## 2019-11-12 VITALS — BP 143/88 | HR 76 | Temp 98.9°F | Ht 71.0 in | Wt 262.8 lb

## 2019-11-12 DIAGNOSIS — K580 Irritable bowel syndrome with diarrhea: Secondary | ICD-10-CM

## 2019-11-12 NOTE — Progress Notes (Signed)
Primary Care Physician: Glean Hess, MD  Primary Gastroenterologist:  Dr. Lucilla Lame  Chief Complaint  Patient presents with  . Follow up diarrhea    HPI: Derrick Galvan. is a 59 y.o. male here for follow-up of GERD with diarrhea and bloating.  The patient had a EGD and colonoscopy in 2017 with a adenomatous polyp found and no other cause for his abdominal symptoms.  The patient did have some esophagitis found during his EGD.  He has been treated with a PPI for the symptoms. The patient now comes in today with being worried about his symptoms.  The patient reports that he has continued bloating without any unexplained weight loss and he reports that his abdomen is making a lot of noise.  He also says that his stools are more yellow therefore he thinks there may be something going on with his gallbladder and he also reports that he is concerned that his pancreas may be involved.  He does report that he has some improvement with Imodium and Lomotil. The patient voices some concern about possible Cryptosporidium because he heard this can cause diarrhea.  Current Outpatient Medications  Medication Sig Dispense Refill  . acetaminophen (TYLENOL) 500 MG tablet Take 1,000 mg by mouth every 6 (six) hours as needed.    . bismuth subsalicylate (PEPTO-BISMOL) 262 MG/15ML suspension Take 30 mLs by mouth every 6 (six) hours as needed. OTC    . diphenoxylate-atropine (LOMOTIL) 2.5-0.025 MG tablet TAKE (1) TABLET BY MOUTH FOUR TIMES A DAY AS NEEDED FOR DIARRHEA. 120 tablet 5  . diphenoxylate-atropine (LOMOTIL) 2.5-0.025 MG tablet Take 1 tablet by mouth 4 (four) times daily as needed for diarrhea or loose stools. 120 tablet 5  . escitalopram (LEXAPRO) 10 MG tablet Take 1.5 tablets (15 mg total) by mouth daily. 45 tablet 5  . famotidine (PEPCID) 10 MG tablet Take 10 mg by mouth 2 (two) times daily. OTC    . triamcinolone cream (KENALOG) 0.1 % Apply 1 application topically 2 (two) times daily. HIVES  from Azithromy reaction.     No current facility-administered medications for this visit.    Allergies as of 11/12/2019 - Review Complete 11/12/2019  Allergen Reaction Noted  . Azithromycin Hives 11/08/2018  . Clindamycin/lincomycin Diarrhea 02/01/2019  . Doxycycline Hives and Diarrhea 02/01/2019  . Keflex [cephalexin] Swelling 01/10/2019    ROS:  General: Negative for anorexia, weight loss, fever, chills, fatigue, weakness. ENT: Negative for hoarseness, difficulty swallowing , nasal congestion. CV: Negative for chest pain, angina, palpitations, dyspnea on exertion, peripheral edema.  Respiratory: Negative for dyspnea at rest, dyspnea on exertion, cough, sputum, wheezing.  GI: See history of present illness. GU:  Negative for dysuria, hematuria, urinary incontinence, urinary frequency, nocturnal urination.  Endo: Negative for unusual weight change.    Physical Examination:   BP (!) 143/88   Pulse 76   Temp 98.9 F (37.2 C) (Oral)   Ht 5\' 11"  (1.803 m)   Wt 262 lb 12.8 oz (119.2 kg)   BMI 36.65 kg/m   General: Well-nourished, well-developed in no acute distress.  Eyes: No icterus. Conjunctivae pink. Lungs: Clear to auscultation bilaterally. Non-labored. Heart: Regular rate and rhythm, no murmurs rubs or gallops.  Abdomen: Bowel sounds are normal, nontender, nondistended, no hepatosplenomegaly or masses, no abdominal bruits or hernia , no rebound or guarding.   Extremities: No lower extremity edema. No clubbing or deformities. Neuro: Alert and oriented x 3.  Grossly intact. Skin: Warm and dry, no  jaundice.   Psych: Alert and cooperative, normal mood and affect.  Labs:    Imaging Studies: No results found.  Assessment and Plan:   Derrick Galvan. is a 59 y.o. y/o male who comes in today with continuing symptoms of irritable bowel syndrome with diarrhea predominance.  The patient has been told that his increased bowel sounds are likely from his irritable bowel syndrome  and increased motility.  He has also been told that pancreatic issues do not typically present with his present symptoms.  He is not reporting any jaundice or unexplained weight loss.  The patient has been told to start taking the dicyclomine he had been given in the past but had not started.  He was also told to try and take the Lomotil before he goes to sleep to help with some of the motility issues he has while sleeping.  The patient will have a breath test for possible bacterial overgrowth sent off due to his bloating and diarrhea.  The patient has been explained the plan agrees with it.      Lucilla Lame, MD. Marval Regal    Note: This dictation was prepared with Dragon dictation along with smaller phrase technology. Any transcriptional errors that result from this process are unintentional.

## 2019-12-05 ENCOUNTER — Other Ambulatory Visit: Payer: Self-pay

## 2019-12-05 ENCOUNTER — Telehealth: Payer: Self-pay | Admitting: Gastroenterology

## 2019-12-05 MED ORDER — DICYCLOMINE HCL 20 MG PO TABS: 20.0000 mg | ORAL_TABLET | Freq: Three times a day (TID) | ORAL | 3 refills | Status: DC

## 2019-12-05 NOTE — Telephone Encounter (Signed)
PT LEFT VM HE STATES HE NEEDS A REFILL ON RX DICYLOMINE TO [facility]

## 2019-12-05 NOTE — Telephone Encounter (Signed)
Rx refill for Dicyclomine has been sent to [facility].

## 2019-12-06 NOTE — Telephone Encounter (Signed)
Pt left for Ginger regarding a refill on rx dycyclomine  20 mg [phone]

## 2020-02-03 ENCOUNTER — Other Ambulatory Visit: Payer: Self-pay | Admitting: Internal Medicine

## 2020-02-03 DIAGNOSIS — F321 Major depressive disorder, single episode, moderate: Secondary | ICD-10-CM

## 2020-02-17 ENCOUNTER — Telehealth: Payer: Self-pay

## 2020-02-17 ENCOUNTER — Other Ambulatory Visit: Payer: Self-pay | Admitting: Internal Medicine

## 2020-02-17 DIAGNOSIS — I82432 Acute embolism and thrombosis of left popliteal vein: Secondary | ICD-10-CM

## 2020-02-17 MED ORDER — RIVAROXABAN 15 MG PO TABS: 15.0000 mg | ORAL_TABLET | Freq: Two times a day (BID) | ORAL | 0 refills | Status: DC

## 2020-02-17 NOTE — Telephone Encounter (Signed)
Patient called saying he is having swelling in his left calf down to his ankle. Its tender to touch and feels the same as before when he had his last DVT. He has not taken Xarelto since November 2020.   Spoke with Dr Army Melia. Send in [place] #[phone] with 0 RFs for the pt. He was informed to start this and wait for a call to be scheduled for a STAT US of his left leg.  He verbalized understanding of this.  CM

## 2020-02-21 ENCOUNTER — Ambulatory Visit
Admission: RE | Admit: 2020-02-21 | Discharge: 2020-02-21 | Disposition: A | Discharge status: Home / Self Care | Payer: 59 | Source: Ambulatory Visit | Attending: Internal Medicine | Admitting: Internal Medicine

## 2020-02-21 ENCOUNTER — Other Ambulatory Visit: Payer: Self-pay

## 2020-02-21 DIAGNOSIS — I82432 Acute embolism and thrombosis of left popliteal vein: Secondary | ICD-10-CM | POA: Insufficient documentation

## 2020-02-25 ENCOUNTER — Ambulatory Visit (INDEPENDENT_AMBULATORY_CARE_PROVIDER_SITE_OTHER): Payer: 59 | Admitting: Internal Medicine

## 2020-02-25 ENCOUNTER — Encounter: Payer: Self-pay | Admitting: Internal Medicine

## 2020-02-25 ENCOUNTER — Other Ambulatory Visit: Payer: Self-pay

## 2020-02-25 VITALS — BP 122/80 | HR 81 | Temp 97.3°F | Ht 71.0 in | Wt 277.0 lb

## 2020-02-25 DIAGNOSIS — M19041 Primary osteoarthritis, right hand: Secondary | ICD-10-CM | POA: Diagnosis not present

## 2020-02-25 DIAGNOSIS — M19042 Primary osteoarthritis, left hand: Secondary | ICD-10-CM

## 2020-02-25 DIAGNOSIS — M25572 Pain in left ankle and joints of left foot: Secondary | ICD-10-CM | POA: Diagnosis not present

## 2020-02-25 DIAGNOSIS — G8929 Other chronic pain: Secondary | ICD-10-CM | POA: Diagnosis not present

## 2020-02-25 DIAGNOSIS — I82432 Acute embolism and thrombosis of left popliteal vein: Secondary | ICD-10-CM

## 2020-02-25 MED ORDER — IBUPROFEN 600 MG PO TABS: 600.0000 mg | ORAL_TABLET | Freq: Three times a day (TID) | ORAL | 5 refills | Status: DC | PRN

## 2020-02-25 NOTE — Progress Notes (Signed)
Date:  02/25/2020   Name:  Derrick Galvan.   DOB:  [birth year]   MRN:  [id number]   Chief Complaint: Edema (Left leg swollen. Neg for DVT. Been swollen on and off for months. )  Hand Pain  There was no injury mechanism. The pain is present in the right fingers and left fingers. The quality of the pain is described as aching and cramping. The pain does not radiate. The pain is mild. Pertinent negatives include no chest pain. He has tried NSAIDs for the symptoms. The treatment provided significant relief.  Ankle Pain  There was no injury mechanism. The pain is present in the left ankle. The quality of the pain is described as aching and cramping. The pain is moderate. The pain has been worsening since onset. Associated symptoms include an inability to bear weight. He has tried NSAIDs for the symptoms. The treatment provided mild relief.  Edema - ongoing swelling in left leg since DVT.  Also pain and swelling in the left ankle due to chronic problems with his foot.  He is on his feet at work all day now.  He is now wearing his compression socks regularly.  Recent US was negative for DVT.   Immunization History  Administered Date(s) Administered  . Influenza,inj,Quad PF,6+ Mos 08/22/2019  . Moderna SARS-COVID-2 Vaccination 01/20/2020, 02/17/2020  . Tdap 08/22/2019    Lab Results  Component Value Date   CREATININE 1.18 08/22/2019   BUN 21 08/22/2019   NA 138 08/22/2019   K 4.9 08/22/2019   CL 102 08/22/2019   CO2 25 08/22/2019   Lab Results  Component Value Date   CHOL 191 08/22/2019   HDL 47 08/22/2019   LDLCALC 125 (H) 08/22/2019   TRIG 102 08/22/2019   CHOLHDL 4.1 08/22/2019   No results found for: TSH No results found for: HGBA1C Lab Results  Component Value Date   WBC 7.0 08/22/2019   HGB 17.1 08/22/2019   HCT 49.3 08/22/2019   MCV 89 08/22/2019   PLT 150 08/22/2019   Lab Results  Component Value Date   ALT 24 08/22/2019   AST 23 08/22/2019   ALKPHOS 80  08/22/2019   BILITOT 1.4 (H) 08/22/2019     Review of Systems  Constitutional: Negative for chills, fatigue and fever.  Respiratory: Negative for cough, chest tightness, shortness of breath and wheezing.   Cardiovascular: Positive for leg swelling. Negative for chest pain and palpitations.  Musculoskeletal: Positive for arthralgias, gait problem and joint swelling.  Neurological: Negative for dizziness, light-headedness and headaches.    Patient Active Problem List   Diagnosis Date Noted  . Generalized anxiety disorder 08/22/2019  . Gastroesophageal reflux disease with esophagitis 08/22/2019  . SCCA (squamous cell carcinoma) of skin 02/01/2019  . DVT (deep venous thrombosis) ([place]) 01/21/2019  . Rash of unknown cause 12/18/2018  . Sleep disturbance 12/18/2018  . Heartburn   . Benign neoplasm of ascending colon   . Hepatitis B 07/09/2015  . Adaptive colitis 09/19/2013  . Chronic diarrhea 07/31/2013    Allergies  Allergen Reactions  . Azithromycin Hives  . Clindamycin/Lincomycin Diarrhea  . Doxycycline Hives and Diarrhea  . Keflex [Cephalexin] Swelling    Past Surgical History:  Procedure Laterality Date  . COLONOSCOPY  02/15/11   repeat 10 years  . COLONOSCOPY WITH PROPOFOL N/A 08/15/2016   Procedure: COLONOSCOPY WITH PROPOFOL;  Surgeon: Lucilla Lame, MD;  Location: [facility];  Service: Endoscopy;  Laterality: N/A;  .  ESOPHAGOGASTRODUODENOSCOPY (EGD) WITH PROPOFOL N/A 08/15/2016   Procedure: ESOPHAGOGASTRODUODENOSCOPY (EGD) WITH PROPOFOL;  Surgeon: Lucilla Lame, MD;  Location: [facility];  Service: Endoscopy;  Laterality: N/A;  . POLYPECTOMY  08/15/2016   Procedure: POLYPECTOMY;  Surgeon: Lucilla Lame, MD;  Location: [place];  Service: Endoscopy;;    Social History   Tobacco Use  . Smoking status: Never Smoker  . Smokeless tobacco: Never Used  Substance Use Topics  . Alcohol use: Yes    Alcohol/week: 0.0 standard drinks    Comment:  occasional  . Drug use: No     Medication list has been reviewed and updated.  Current Meds  Medication Sig  . acetaminophen (TYLENOL) 500 MG tablet Take 1,000 mg by mouth every 6 (six) hours as needed.  . bismuth subsalicylate (PEPTO-BISMOL) 262 MG/15ML suspension Take 30 mLs by mouth every 6 (six) hours as needed. OTC  . dicyclomine (BENTYL) 20 MG tablet Take 1 tablet (20 mg total) by mouth 3 (three) times daily before meals.  . diphenoxylate-atropine (LOMOTIL) 2.5-0.025 MG tablet TAKE (1) TABLET BY MOUTH FOUR TIMES A DAY AS NEEDED FOR DIARRHEA.  . famotidine (PEPCID) 10 MG tablet Take 10 mg by mouth 2 (two) times daily. OTC  . triamcinolone cream (KENALOG) 0.1 % Apply 1 application topically 2 (two) times daily. HIVES from Azithromy reaction.    PHQ 2/9 Scores 02/25/2020 08/22/2019 06/19/2019 05/08/2019  PHQ - 2 Score 1 0 1 6  PHQ- 9 Score 1 5 9 21     BP Readings from Last 3 Encounters:  02/25/20 122/80  11/12/19 (!) 143/88  08/22/19 128/78    Physical Exam Vitals and nursing note reviewed.  Constitutional:      General: He is not in acute distress.    Appearance: Normal appearance. He is well-developed.  HENT:     Head: Normocephalic and atraumatic.  Cardiovascular:     Rate and Rhythm: Normal rate and regular rhythm.     Pulses: Normal pulses.     Heart sounds: No murmur.  Pulmonary:     Effort: Pulmonary effort is normal. No respiratory distress.     Breath sounds: No wheezing or rhonchi.  Musculoskeletal:        General: Swelling present.     Right wrist: No bony tenderness or snuff box tenderness. Normal range of motion. Normal pulse.     Left wrist: No bony tenderness or snuff box tenderness. Normal range of motion. Normal pulse.     Cervical back: Normal range of motion.     Right lower leg: No edema.     Left lower leg: Tenderness present. No deformity or lacerations. 2+ Pitting Edema present.     Comments: Tenderness of the first PIP joints bilaterally without  swelling or crepitus  Skin:    General: Skin is warm and dry.     Capillary Refill: Capillary refill takes less than 2 seconds.     Findings: No rash.  Neurological:     Mental Status: He is alert and oriented to person, place, and time.  Psychiatric:        Behavior: Behavior normal.        Thought Content: Thought content normal.     Wt Readings from Last 3 Encounters:  02/25/20 277 lb (125.6 kg)  11/12/19 262 lb 12.8 oz (119.2 kg)  08/22/19 253 lb (114.8 kg)    BP 122/80   Pulse 81   Temp (!) 97.3 F (36.3 C) (Temporal)   Ht  5\' 11"  (1.803 m)   Wt 277 lb (125.6 kg)   SpO2 98%   BMI 38.63 kg/m   Assessment and Plan: 1. Deep vein thrombosis (DVT) of popliteal vein of left lower extremity, unspecified chronicity (HCC) With chronic venous insufficiency Wear compression socks daily Elevate when able  2. Chronic pain of left ankle Resume ibuprofen and refer to Orthopedics - Ambulatory referral to Orthopedic Surgery - ibuprofen (ADVIL) 600 MG tablet; Take 1 tablet (600 mg total) by mouth every 8 (eight) hours as needed.  Dispense: 100 tablet; Refill: 5  3. Primary osteoarthritis of both hands - ibuprofen (ADVIL) 600 MG tablet; Take 1 tablet (600 mg total) by mouth every 8 (eight) hours as needed.  Dispense: 100 tablet; Refill: 5   Partially dictated using Editor, commissioning. Any errors are unintentional.  Halina Maidens, MD [facility]  02/25/2020

## 2020-02-25 NOTE — Patient Instructions (Signed)
Compression stockings daily

## 2020-03-16 IMAGING — CT CT ANGIOGRAPHY CHEST
1 of 4 series · 18 of 32 positions shown · IV contrast (iopamidol)
Comparison: Chest radiograph February 01, 2019

CLINICAL DATA: Chest pain. History of lower extremity deep venous
thrombosis

EXAM:
CT ANGIOGRAPHY CHEST WITH CONTRAST
TECHNIQUE: Multidetector CT imaging of the chest was performed using the
standard protocol during bolus administration of intravenous
contrast. Multiplanar CT image reconstructions and MIPs were
obtained to evaluate the vascular anatomy.
CONTRAST:  75mL 3OIUI5-X89 IOPAMIDOL (3OIUI5-X89) INJECTION 76%

[Series 6: thins · axial · 0.70mm/px · z∈[-371,-56]mm · 18 of 347 slices shown]
[im 16/347  lung]
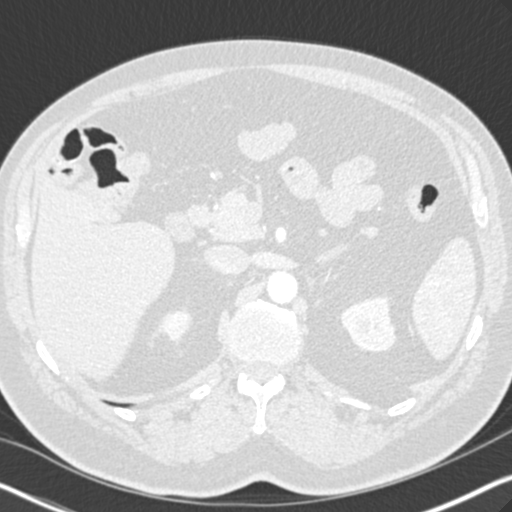
[im 32/347  soft-tissue]
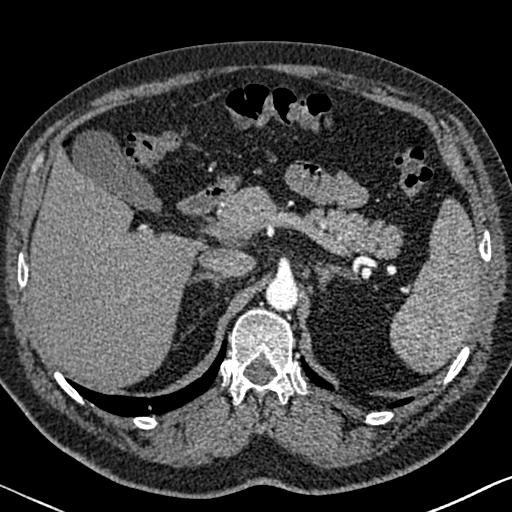
[im 48/347  lung]
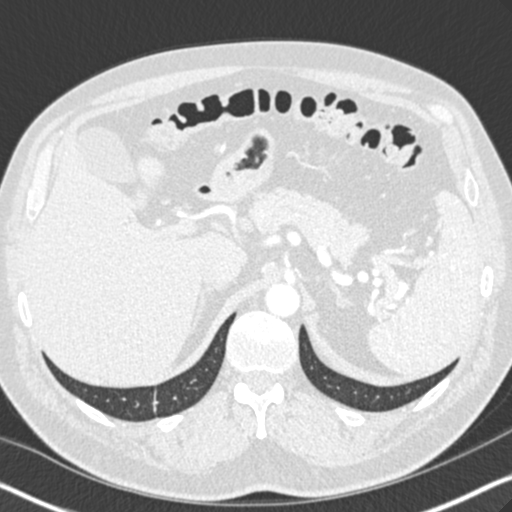
[im 79/347  soft-tissue]
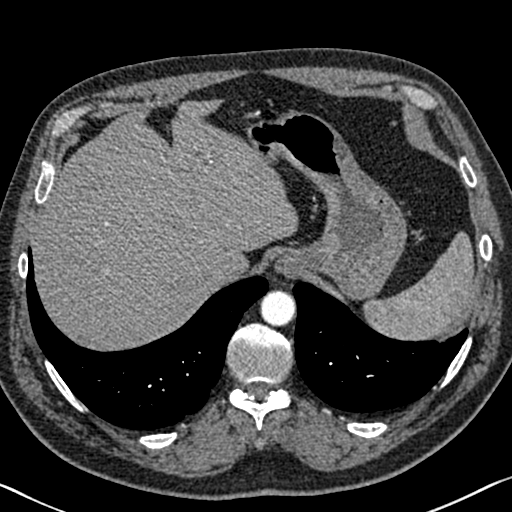
[im 95/347  lung]
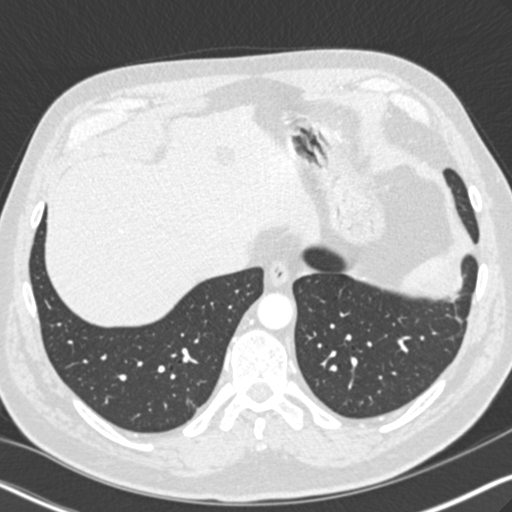
[im 111/347  soft-tissue]
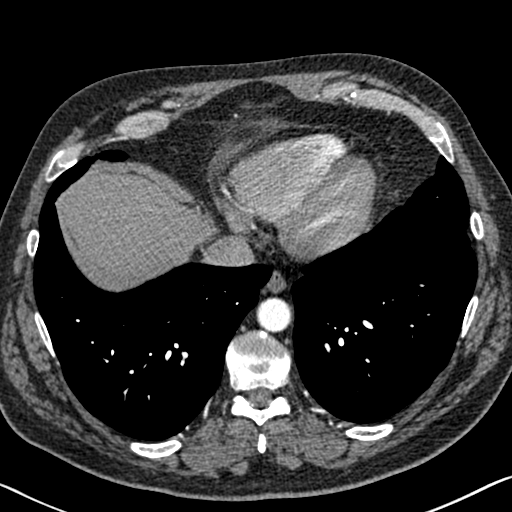
[im 126/347  lung]
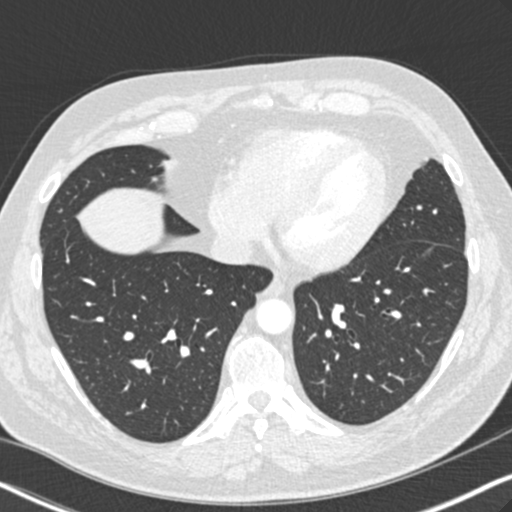
[im 142/347  soft-tissue]
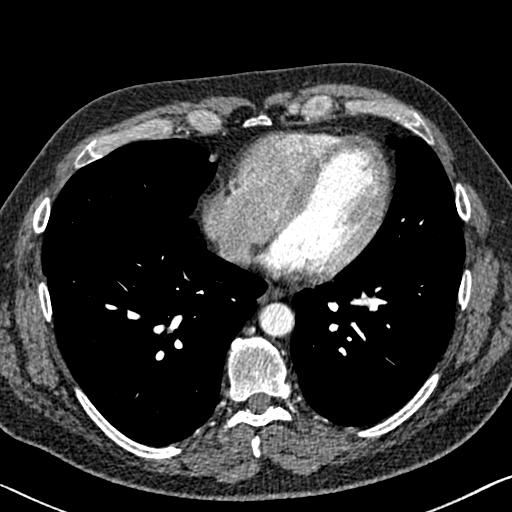
[im 158/347  lung]
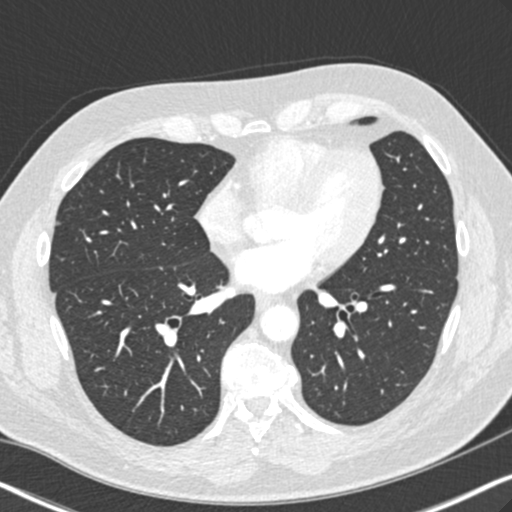
[im 189/347  soft-tissue]
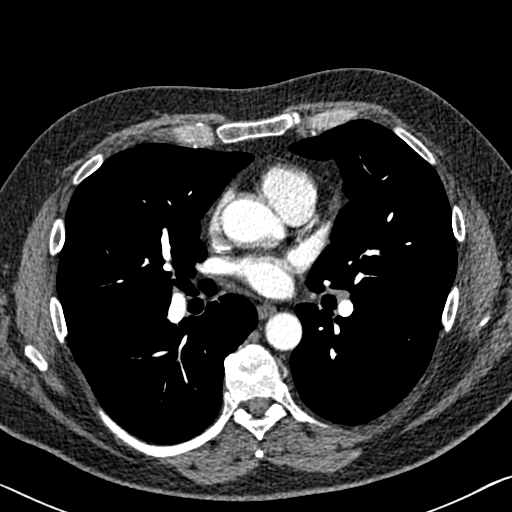
[im 205/347  lung]
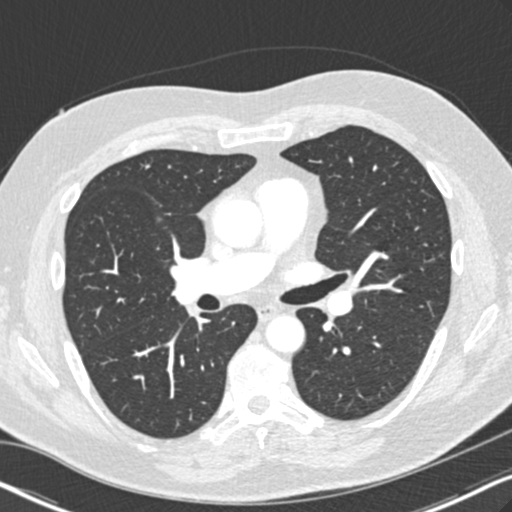
[im 221/347  soft-tissue]
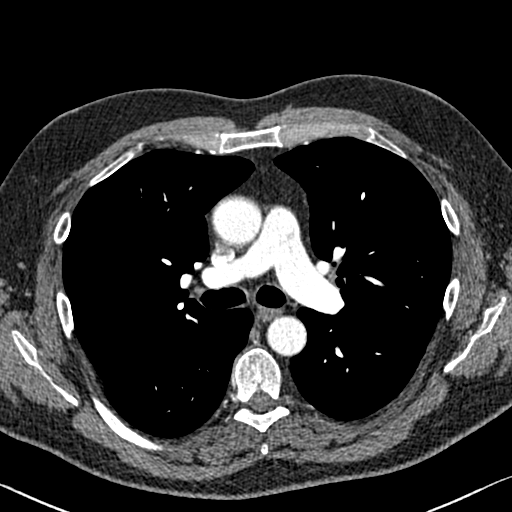
[im 236/347  lung]
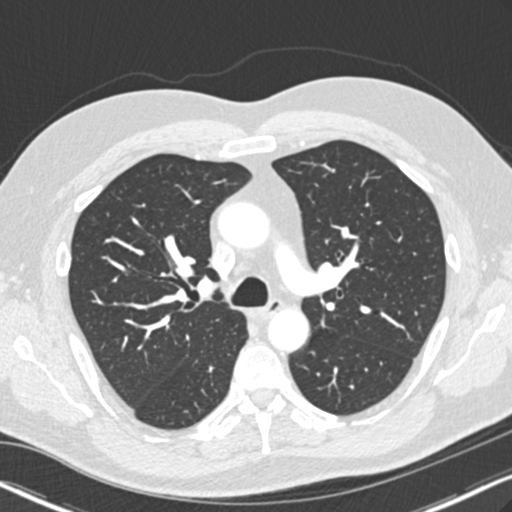
[im 252/347  soft-tissue]
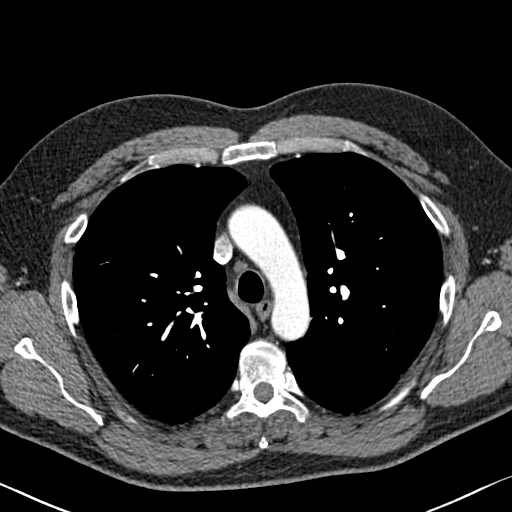
[im 268/347  lung]
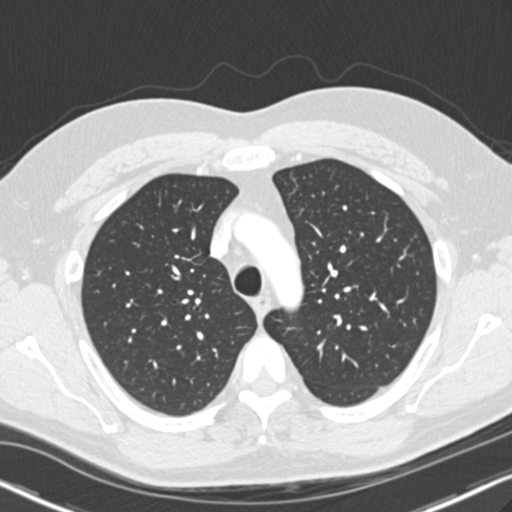
[im 299/347  soft-tissue]
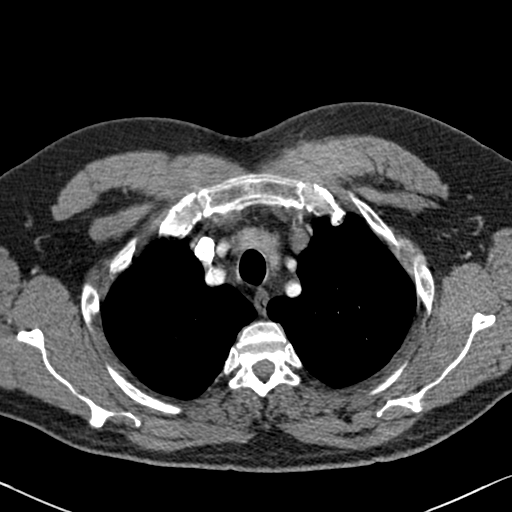
[im 315/347  lung]
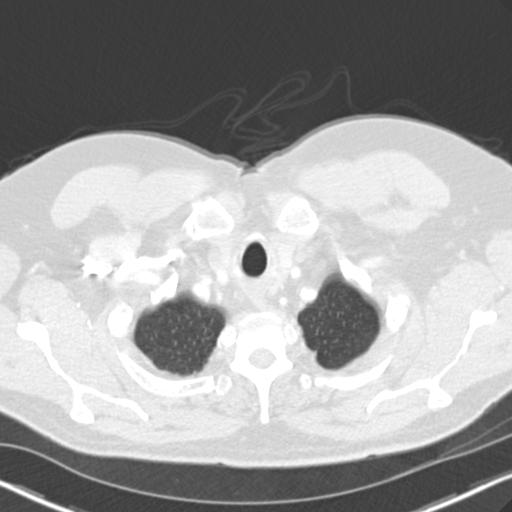
[im 331/347  soft-tissue]
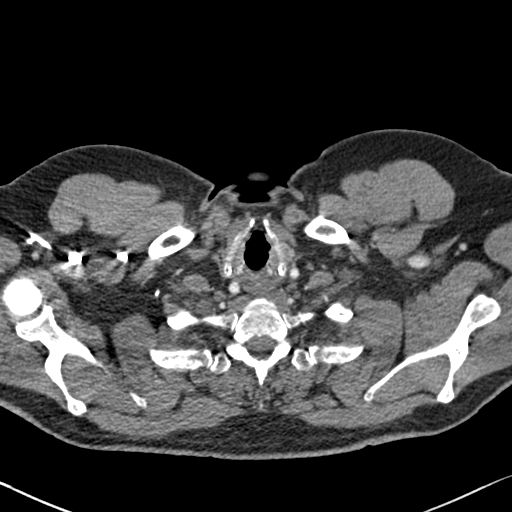

[18 of 32 positions shown; findings below may reference images not displayed]

FINDINGS: Cardiovascular: There is no demonstrable pulmonary embolus. There is
no thoracic aortic aneurysm or dissection. Visualized great vessels
appear unremarkable. There is no pericardial effusion or pericardial
thickening.

Mediastinum/Nodes: Thyroid appears normal. There are occasional
subcentimeter mediastinal lymph nodes. There is no evident thoracic
adenopathy. No esophageal lesions are evident.

Lungs/Pleura: There is no edema or consolidation. There is mild
bibasilar atelectasis. On axial slice 41 series 5, there is a 2 mm
nodular opacity in the periphery of the anterior segment of the
right upper lobe. There is a 5 x 4 mm probable lymph node along the
major fissure on the right. There is a 4 mm nodular opacity abutting
the pleura in the lateral segment of the right middle lobe seen on
axial slice 65 series 5. There is a 5 x 4 mm nodular opacity in the
posterior segment of the right lower lobe seen on axial slice 107
series 5 with adjacent atelectasis. No pleural effusion or pleural
thickening evident.

Upper Abdomen: There is a 1.4 x 1.3 cm cyst in the left lobe of the
liver near the dome. Visualized upper abdominal structures otherwise
appear normal.

Musculoskeletal: There are no blastic or lytic bone lesions. There
is no chest wall lesion.

Review of the MIP images confirms the above findings.
IMPRESSION: 1. No demonstrable pulmonary embolus. No thoracic aortic aneurysm or
dissection.

2. No lung edema or consolidation. Several nodular opacities in the
lungs, largest measuring 5 mm. No follow-up needed if patient is
low-risk (and has no known or suspected primary neoplasm).
Non-contrast chest CT can be considered in 12 months if patient is
high-risk. This recommendation follows the consensus statement:
Guidelines for Management of Incidental Pulmonary Nodules Detected
[DATE]. Areas of mild atelectatic change noted.

3.  No demonstrable thoracic adenopathy.

## 2020-06-01 ENCOUNTER — Encounter: Payer: Self-pay | Admitting: Emergency Medicine

## 2020-06-01 ENCOUNTER — Telehealth: Payer: Self-pay

## 2020-06-01 ENCOUNTER — Other Ambulatory Visit: Payer: Self-pay

## 2020-06-01 ENCOUNTER — Ambulatory Visit (INDEPENDENT_AMBULATORY_CARE_PROVIDER_SITE_OTHER): Payer: 59

## 2020-06-01 ENCOUNTER — Other Ambulatory Visit: Payer: Self-pay | Admitting: Gastroenterology

## 2020-06-01 ENCOUNTER — Ambulatory Visit
Admission: EM | Admit: 2020-06-01 | Discharge: 2020-06-01 | Disposition: A | Discharge status: Home / Self Care | Payer: 59 | Attending: Family Medicine | Admitting: Family Medicine

## 2020-06-01 ENCOUNTER — Ambulatory Visit: Payer: 59 | Admitting: Internal Medicine

## 2020-06-01 DIAGNOSIS — M79662 Pain in left lower leg: Secondary | ICD-10-CM

## 2020-06-01 DIAGNOSIS — Z86718 Personal history of other venous thrombosis and embolism: Secondary | ICD-10-CM

## 2020-06-01 DIAGNOSIS — K589 Irritable bowel syndrome without diarrhea: Secondary | ICD-10-CM

## 2020-06-01 NOTE — ED Triage Notes (Signed)
Patient c/o pain with standing on his left leg. He states he had a DVT last year in the same leg. He wants to rule out a DVT. He has been off of Xarelto since last December.

## 2020-06-01 NOTE — ED Provider Notes (Signed)
[facility]    CSN: [id number] Arrival date & time: 06/01/20  1355      History   Chief Complaint Chief Complaint  Patient presents with  . Claudication    HPI Derrick Galvan. is a 59 y.o. male.   59 yo male with a c/o left calf pain for the past week while standing. States he had a DVT in the same leg last year and took Xarelto for about 6 months. (last dose was in December 2020). Denies any injury, surgeries, prolong immobilization, chest pains, shortness of breath.      Past Medical History:  Diagnosis Date  . Acute gastritis without hemorrhage   . Adaptive colitis 09/19/2013  . Chronic diarrhea 07/31/2013  . Hepatitis B 1989   resolved  . Skin cancer    on face - removed under local    Patient Active Problem List   Diagnosis Date Noted  . Generalized anxiety disorder 08/22/2019  . Gastroesophageal reflux disease with esophagitis 08/22/2019  . SCCA (squamous cell carcinoma) of skin 02/01/2019  . DVT (deep venous thrombosis) ([place]) 01/21/2019  . Rash of unknown cause 12/18/2018  . Sleep disturbance 12/18/2018  . Heartburn   . Benign neoplasm of ascending colon   . Hepatitis B 07/09/2015  . Adaptive colitis 09/19/2013  . Chronic diarrhea 07/31/2013    Past Surgical History:  Procedure Laterality Date  . COLONOSCOPY  02/15/11   repeat 10 years  . COLONOSCOPY WITH PROPOFOL N/A 08/15/2016   Procedure: COLONOSCOPY WITH PROPOFOL;  Surgeon: Lucilla Lame, MD;  Location: [place];  Service: Endoscopy;  Laterality: N/A;  . ESOPHAGOGASTRODUODENOSCOPY (EGD) WITH PROPOFOL N/A 08/15/2016   Procedure: ESOPHAGOGASTRODUODENOSCOPY (EGD) WITH PROPOFOL;  Surgeon: Lucilla Lame, MD;  Location: [place];  Service: Endoscopy;  Laterality: N/A;  . POLYPECTOMY  08/15/2016   Procedure: POLYPECTOMY;  Surgeon: Lucilla Lame, MD;  Location: [place];  Service: Endoscopy;;       Home Medications    Prior to Admission medications   Medication  Sig Start Date End Date Taking? Authorizing Provider  bismuth subsalicylate (PEPTO-BISMOL) 262 MG/15ML suspension Take 30 mLs by mouth every 6 (six) hours as needed. OTC   Yes [provider]  dicyclomine (BENTYL) 20 MG tablet Take 1 tablet (20 mg total) by mouth 3 (three) times daily before meals. 12/05/19  Yes Lucilla Lame, MD  diphenoxylate-atropine (LOMOTIL) 2.5-0.025 MG tablet TAKE (1) TABLET BY MOUTH FOUR TIMES A DAY AS NEEDED FOR DIARRHEA. 10/14/19  Yes Lucilla Lame, MD  famotidine (PEPCID) 10 MG tablet Take 10 mg by mouth 2 (two) times daily. OTC   Yes [provider]  triamcinolone cream (KENALOG) 0.1 % Apply 1 application topically 2 (two) times daily. HIVES from Azithromy reaction.   Yes [provider]  acetaminophen (TYLENOL) 500 MG tablet Take 1,000 mg by mouth every 6 (six) hours as needed.    [provider]  ALPRAZolam Duanne Moron) 0.5 MG tablet alprazolam 0.5 mg tablet    [provider]  Efinaconazole (JUBLIA) 10 % SOLN Apply topically.    [provider]  escitalopram (LEXAPRO) 10 MG tablet TAKE 1 & 1/2 TABLET BY MOUTH ONCE DAILY Patient not taking: Reported on 02/25/2020 02/03/20   Glean Hess, MD  ibuprofen (ADVIL) 600 MG tablet Take 1 tablet (600 mg total) by mouth every 8 (eight) hours as needed. 02/25/20   Glean Hess, MD  meloxicam (MOBIC) 15 MG tablet meloxicam 15 mg tablet  [provider]  Rivaroxaban (XARELTO) 15 MG TABS tablet Take 1 tablet (15 mg total) by mouth 2 (two) times daily with a meal. Patient not taking: Reported on 02/25/2020 02/17/20   Glean Hess, MD    Family History Family History  Problem Relation Age of Onset  . Healthy Mother   . COPD Father     Social History Social History   Tobacco Use  . Smoking status: Never Smoker  . Smokeless tobacco: Never Used  Vaping Use  . Vaping Use: Never used  Substance Use Topics  . Alcohol use: Yes    Alcohol/week: 0.0 standard  drinks    Comment: occasional  . Drug use: No     Allergies   Azithromycin, Clindamycin/lincomycin, Doxycycline, and Keflex [cephalexin]   Review of Systems Review of Systems   Physical Exam Triage Vital Signs ED Triage Vitals  Enc Vitals Group     BP 06/01/20 1448 (!) 124/89     Pulse Rate 06/01/20 1448 80     Resp 06/01/20 1448 18     Temp 06/01/20 1448 98.2 F (36.8 C)     Temp Source 06/01/20 1448 Oral     SpO2 06/01/20 1448 99 %     Weight 06/01/20 1445 (!) 270 lb (122.5 kg)     Height 06/01/20 1445 5\' 11"  (1.803 m)     Head Circumference --      Peak Flow --      Pain Score 06/01/20 1445 0     Pain Loc --      Pain Edu? --      Excl. in [place]? --    No data found.  Updated Vital Signs BP (!) 124/89 (BP Location: Right Arm)   Pulse 80   Temp 98.2 F (36.8 C) (Oral)   Resp 18   Ht 5\' 11"  (1.803 m)   Wt (!) 122.5 kg   SpO2 99%   BMI 37.66 kg/m   Visual Acuity Right Eye Distance:   Left Eye Distance:   Bilateral Distance:    Right Eye Near:   Left Eye Near:    Bilateral Near:     Physical Exam Vitals and nursing note reviewed.  Constitutional:      General: He is not in acute distress.    Appearance: He is not toxic-appearing or diaphoretic.  Musculoskeletal:     Left lower leg: Swelling and tenderness (over the calf) present. No deformity, lacerations or bony tenderness.     Comments: Left lower extremity neurovascularly intact  Neurological:     Mental Status: He is alert.      UC Treatments / Results  Labs (all labs ordered are listed, but only abnormal results are displayed) Labs Reviewed - No data to display  EKG   Radiology US Venous Img Lower Unilateral Left  Result Date: 06/01/2020 CLINICAL DATA:  History of DVT.  Left leg swelling and pain EXAM: LEFT LOWER EXTREMITY VENOUS DOPPLER ULTRASOUND TECHNIQUE: Gray-scale sonography with graded compression, as well as color Doppler and duplex ultrasound were performed to evaluate the  lower extremity deep venous systems from the level of the common femoral vein and including the common femoral, femoral, profunda femoral, popliteal and calf veins including the posterior tibial, peroneal and gastrocnemius veins when visible. The superficial great saphenous vein was also interrogated. Spectral Doppler was utilized to evaluate flow at rest and with distal augmentation maneuvers in the common femoral, femoral and popliteal veins. COMPARISON:  02/21/2020 FINDINGS: Contralateral  Common Femoral Vein: Respiratory phasicity is normal and symmetric with the symptomatic side. No evidence of thrombus. Normal compressibility. Common Femoral Vein: No evidence of thrombus. Normal compressibility, respiratory phasicity and response to augmentation. Saphenofemoral Junction: No evidence of thrombus. Normal compressibility and flow on color Doppler imaging. Profunda Femoral Vein: No evidence of thrombus. Normal compressibility and flow on color Doppler imaging. Femoral Vein: Duplicated superficial femoral veins with partially occlusive thrombus with incomplete compressibility of 1 of the moieties distally. Popliteal Vein: Partially occlusive thrombus with incomplete compressibility. Calf Veins: No evidence of thrombus. Normal compressibility and flow on color Doppler imaging. Superficial Great Saphenous Vein: No evidence of thrombus. Normal compressibility. Venous Reflux:  None. Other Findings:  None. IMPRESSION: Residual nonocclusive thrombus within the left popliteal vein extending into the distal aspect of a duplicated superficial femoral vein. Findings are similar in appearance to the prior study. No new areas of deep venous thrombosis within the left lower extremity are identified. Electronically Signed   By: Davina Poke D.O.   On: 06/01/2020 17:04    Procedures Procedures (including critical care time)  Medications Ordered in UC Medications - No data to display  Initial Impression / Assessment  and Plan / UC Course  I have reviewed the triage vital signs and the nursing notes.  Pertinent labs & imaging results that were available during my care of the patient were reviewed by me and considered in my medical decision making (see chart for details).      Final Clinical Impressions(s) / UC Diagnoses   Final diagnoses:  Pain of left calf  History of DVT of lower extremity     Discharge Instructions     Compression stocking, remain active  Follow up with Dr.Dew vascular specialist    ED Prescriptions    None      1. [place] results and diagnosis reviewed with patient; residual non-occlusive thrombus present; no new areas of DVT 2. Recommend compression stocking, activity 3. Discussed case with Dr. Lucky Cowboy (vascular); no need for restarting Xarelto at this time 4. Follow up with Dr. Lucky Cowboy in 2 weeks   PDMP not reviewed this encounter.   Norval Gable, MD 06/01/20 [id number]

## 2020-06-01 NOTE — Discharge Instructions (Addendum)
Compression stocking, remain active  Follow up with Dr.Dew vascular specialist

## 2020-06-01 NOTE — Telephone Encounter (Signed)
Called patient and left a VM asking him if he is still taking Xarelto from his last possible DVT. Also, told patient on VM it may be best for him to go straight to Urgent Care to be evaluated so they can do a a ultrasound to rule out a DVT. Explained they can get the results quicker than we can get him scheduled for one.  Told him to call the office back with any questions. CRm created.  CM

## 2020-06-02 ENCOUNTER — Telehealth (HOSPITAL_COMMUNITY): Payer: Self-pay | Admitting: Emergency Medicine

## 2020-06-02 ENCOUNTER — Telehealth: Payer: Self-pay | Admitting: Internal Medicine

## 2020-06-02 NOTE — Telephone Encounter (Signed)
Patient's call rang over to callback office and this RN received his call.  Patient states he is having difficulty getting an appointment with provider recommended at [facility] visit due to not having a referral.  Spoke to providers on site today, and they recommended patient to go through PCP for referral for closer monitoring.  Attempted to call patient back to inform him, and no answer, LVM.

## 2020-06-02 NOTE — Telephone Encounter (Signed)
Copied from [place] [id number]. Topic: Referral - Request for Referral >> Jun 02, 2020  4:13 PM Gillis Ends D wrote: Has patient seen PCP for this complaint? Yes.   *If NO, is insurance requiring patient see PCP for this issue before PCP can refer them? Referral for which specialty: Vein Specialist Preferred provider/office:  Reason for referral: Results from vein study

## 2020-06-03 ENCOUNTER — Other Ambulatory Visit: Payer: Self-pay

## 2020-06-03 ENCOUNTER — Telehealth: Payer: Self-pay | Admitting: Internal Medicine

## 2020-06-03 ENCOUNTER — Other Ambulatory Visit: Payer: Self-pay | Admitting: Gastroenterology

## 2020-06-03 DIAGNOSIS — I82432 Acute embolism and thrombosis of left popliteal vein: Secondary | ICD-10-CM

## 2020-06-03 DIAGNOSIS — K589 Irritable bowel syndrome without diarrhea: Secondary | ICD-10-CM

## 2020-06-03 DIAGNOSIS — M79662 Pain in left lower leg: Secondary | ICD-10-CM

## 2020-06-03 NOTE — Telephone Encounter (Signed)
Copied from [place] [id number]. Topic: Referral - Request for Referral >> Jun 02, 2020  4:13 PM Gillis Ends D wrote: Has patient seen PCP for this complaint? Yes.   *If NO, is insurance requiring patient see PCP for this issue before PCP can refer them? Referral for which specialty: Vein Specialist Preferred provider/office:  Reason for referral: Results from vein study >> Jun 03, 2020  8:41 AM Alanda Slim E wrote: Pt wants referral to [facility] to [facility] / their phone number is [phone]/ Dr. Corene Cornea Dew/ please advise  Pt needs referral asap

## 2020-06-03 NOTE — Telephone Encounter (Signed)
Referral placed as Urgent for patients current problems and symptoms to Dr Leotis Pain as requested.   CM

## 2020-06-16 ENCOUNTER — Ambulatory Visit (INDEPENDENT_AMBULATORY_CARE_PROVIDER_SITE_OTHER): Payer: 59 | Admitting: Nurse Practitioner

## 2020-06-16 ENCOUNTER — Other Ambulatory Visit: Payer: Self-pay

## 2020-06-16 ENCOUNTER — Encounter (INDEPENDENT_AMBULATORY_CARE_PROVIDER_SITE_OTHER): Payer: Self-pay | Admitting: Vascular Surgery

## 2020-06-16 VITALS — BP 133/80 | HR 73 | Resp 16 | Ht 71.5 in | Wt 267.0 lb

## 2020-06-16 DIAGNOSIS — I83891 Varicose veins of right lower extremities with other complications: Secondary | ICD-10-CM

## 2020-06-16 DIAGNOSIS — I82432 Acute embolism and thrombosis of left popliteal vein: Secondary | ICD-10-CM | POA: Diagnosis not present

## 2020-06-16 DIAGNOSIS — M7660 Achilles tendinitis, unspecified leg: Secondary | ICD-10-CM | POA: Insufficient documentation

## 2020-06-16 HISTORY — DX: Achilles tendinitis, unspecified leg: M76.60

## 2020-06-16 NOTE — Progress Notes (Signed)
Subjective:    Patient ID: Derrick Galvan., male    DOB: 1960-11-30, 59 y.o.   MRN: [id number] Chief Complaint  Patient presents with  . New Patient (Initial Visit)    ref Army Melia dvt left calf pain    The patient presents today as referral from Dr. Army Melia following her recent ED visit.  The patient had a previous history of a DVT in February 2020.  Initially was felt to be unprovoked however after discussion with the patient, it was likely a provoked DVT as he had recently been diagnosed with skin cancer.  Since that time he has had the cancerous lesions treated.  After the DVT was found the patient completed 6 months of Xarelto and generally had not had any issues except for daily swelling before this recent ED visit.  He noted that the swelling was worse and he was having some persistent pain.  Noninvasive studies done in the ED revealed chronic thrombus with no change from previous studies.  Patient does wear medical grade 1 compression stockings daily.  He is also active daily based on his employment.   Review of Systems  Cardiovascular: Positive for leg swelling.  Musculoskeletal: Positive for arthralgias and myalgias.  All other systems reviewed and are negative.      Objective:   Physical Exam Vitals reviewed.  HENT:     Head: Normocephalic.  Cardiovascular:     Rate and Rhythm: Normal rate and regular rhythm.     Pulses: Normal pulses.  Pulmonary:     Effort: Pulmonary effort is normal.  Skin:    General: Skin is warm and dry.  Neurological:     Mental Status: He is alert and oriented to person, place, and time.  Psychiatric:        Mood and Affect: Mood normal.        Behavior: Behavior normal.        Thought Content: Thought content normal.        Judgment: Judgment normal.     BP 133/80 (BP Location: Right Arm)   Pulse 73   Resp 16   Ht 5' 11.5" (1.816 m)   Wt 267 lb (121.1 kg)   BMI 36.72 kg/m   Past Medical History:  Diagnosis Date  . Acute  gastritis without hemorrhage   . Adaptive colitis 09/19/2013  . Chronic diarrhea 07/31/2013  . Hepatitis B 1989   resolved  . Skin cancer    on face - removed under local    Social History   Socioeconomic History  . Marital status: Single    Spouse name: Not on file  . Galvan of children: 0  . Years of education: Not on file  . Highest education level: Not on file  Occupational History  . Occupation: Freight forwarder  Tobacco Use  . Smoking status: Never Smoker  . Smokeless tobacco: Never Used  Vaping Use  . Vaping Use: Never used  Substance and Sexual Activity  . Alcohol use: Yes    Alcohol/week: 0.0 standard drinks    Comment: occasional  . Drug use: No  . Sexual activity: Yes  Other Topics Concern  . Not on file  Social History Narrative  . Not on file   Social Determinants of Health   Financial Resource Strain:   . Difficulty of Paying Living Expenses:   Food Insecurity:   . Worried About Charity fundraiser in the Last Year:   . [place] in the  Last Year:   Transportation Needs:   . Film/video editor (Medical):   Marland Kitchen Lack of Transportation (Non-Medical):   Physical Activity:   . Days of Exercise per Week:   . Minutes of Exercise per Session:   Stress:   . Feeling of Stress :   Social Connections:   . Frequency of Communication with Friends and Family:   . Frequency of Social Gatherings with Friends and Family:   . Attends Religious Services:   . Active Member of Clubs or Organizations:   . Attends Archivist Meetings:   Marland Kitchen Marital Status:   Intimate Partner Violence:   . Fear of Current or Ex-Partner:   . Emotionally Abused:   Marland Kitchen Physically Abused:   . Sexually Abused:     Past Surgical History:  Procedure Laterality Date  . COLONOSCOPY  02/15/11   repeat 10 years  . COLONOSCOPY WITH PROPOFOL N/A 08/15/2016   Procedure: COLONOSCOPY WITH PROPOFOL;  Surgeon: Lucilla Lame, MD;  Location: [place];  Service: Endoscopy;   Laterality: N/A;  . ESOPHAGOGASTRODUODENOSCOPY (EGD) WITH PROPOFOL N/A 08/15/2016   Procedure: ESOPHAGOGASTRODUODENOSCOPY (EGD) WITH PROPOFOL;  Surgeon: Lucilla Lame, MD;  Location: [place];  Service: Endoscopy;  Laterality: N/A;  . POLYPECTOMY  08/15/2016   Procedure: POLYPECTOMY;  Surgeon: Lucilla Lame, MD;  Location: [place];  Service: Endoscopy;;    Family History  Problem Relation Age of Onset  . Healthy Mother   . COPD Father     Allergies  Allergen Reactions  . Azithromycin Hives  . Amoxicillin   . Clindamycin/Lincomycin Diarrhea  . Doxycycline Hives and Diarrhea  . Keflex [Cephalexin] Swelling       Assessment & Plan:   1. Varicose veins of right lower extremity with other complications Patient does have a varicosity in his right thigh that is concerning for him.  It is not extensively painful or causing difficulties at this time, therefore when the patient returns in 6 months we will do a right lower extremity venous reflux to see if there are possible treatment options versus conservative therapy. - VAS [place] LOWER EXTREMITY VENOUS REFLUX; Future  2. Deep vein thrombosis (DVT) of popliteal vein of left lower extremity, unspecified chronicity (HCC) The patient had a recent DVT study of his left lower extremity which showed no new thrombus.  Given the chronic nature of the DVT, no intervention is indicated.  We discussed postphlebitic symptoms in addition to expected changes after DVT including swelling.  Utilization of medical grade 1 compression stockings, elevation and exercise was also encouraged.  We discussed prophylactic anticoagulation and doing a reduced 10 mg Xarelto dosage versus the 20 mg that he was on when his DVT was active.  Instead we will try daily 81 mg aspirin, this should be enteric-coated, to see if this helps with any of the discomfort that he has been feeling.   Current Outpatient Medications on File Prior to Visit  Medication Sig  Dispense Refill  . acetaminophen (TYLENOL) 500 MG tablet Take 1,000 mg by mouth every 6 (six) hours as needed.    . ALPRAZolam (XANAX) 0.5 MG tablet alprazolam 0.5 mg tablet    . bismuth subsalicylate (PEPTO-BISMOL) 262 MG/15ML suspension Take 30 mLs by mouth every 6 (six) hours as needed. OTC    . dicyclomine (BENTYL) 20 MG tablet TAKE (1) TABLET BY MOUTH THREE TIMES A DAY BEFORE MEALS. 90 tablet 3  . diphenoxylate-atropine (LOMOTIL) 2.5-0.025 MG tablet TAKE (1) TABLET BY MOUTH  FOUR TIMES A DAY AS NEEDED FOR DIARRHEA. 120 tablet 6  . Efinaconazole (JUBLIA) 10 % SOLN Apply topically.    . famotidine (PEPCID) 10 MG tablet Take 10 mg by mouth 2 (two) times daily. OTC    . ibuprofen (ADVIL) 600 MG tablet Take 1 tablet (600 mg total) by mouth every 8 (eight) hours as needed. 100 tablet 5  . meloxicam (MOBIC) 15 MG tablet meloxicam 15 mg tablet    . triamcinolone cream (KENALOG) 0.1 % Apply 1 application topically as needed. HIVES from Azithromy reaction.     Marland Kitchen escitalopram (LEXAPRO) 10 MG tablet TAKE 1 & 1/2 TABLET BY MOUTH ONCE DAILY (Patient not taking: Reported on 02/25/2020) 45 tablet 5  . Rivaroxaban (XARELTO) 15 MG TABS tablet Take 1 tablet (15 mg total) by mouth 2 (two) times daily with a meal. (Patient not taking: Reported on 02/25/2020) 42 tablet 0   No current facility-administered medications on file prior to visit.    There are no Patient Instructions on file for this visit. No follow-ups on file.   Kris Hartmann, NP

## 2020-08-27 ENCOUNTER — Other Ambulatory Visit: Payer: Self-pay

## 2020-08-27 DIAGNOSIS — K2100 Gastro-esophageal reflux disease with esophagitis, without bleeding: Secondary | ICD-10-CM

## 2020-08-27 DIAGNOSIS — K529 Noninfective gastroenteritis and colitis, unspecified: Secondary | ICD-10-CM

## 2020-08-27 DIAGNOSIS — K589 Irritable bowel syndrome without diarrhea: Secondary | ICD-10-CM

## 2020-08-27 MED ORDER — SUPREP BOWEL PREP KIT 17.5-3.13-1.6 GM/177ML PO SOLN
1.0000 | ORAL | 0 refills | Status: DC
Start: 2020-08-27 — End: 2021-01-13

## 2020-09-01 DIAGNOSIS — I83891 Varicose veins of right lower extremities with other complications: Secondary | ICD-10-CM | POA: Insufficient documentation

## 2020-09-01 NOTE — Progress Notes (Signed)
Date:  09/02/2020   Name:  Derrick Galvan.   DOB:  January 02, 1961   MRN:  [id number]   Chief Complaint: Annual Exam and Flu Vaccine  Derrick Galvan. is a 59 y.o. male who presents today for his Complete Annual Exam. He feels fairly well. He reports exercising none. He reports he is sleeping fairly well.   Colonoscopy: 08/2016 repeat scheduled 11/21  Immunization History  Administered Date(s) Administered  . Influenza,inj,Quad PF,6+ Mos 08/22/2019  . Moderna SARS-COVID-2 Vaccination 01/20/2020, 02/17/2020  . Tdap 08/22/2019    Gastroesophageal Reflux He complains of chest pain (occassionally) and heartburn. He reports no abdominal pain, no choking or no wheezing. This is a recurrent problem. The problem occurs frequently. Associated symptoms include fatigue. He has tried a histamine-2 antagonist for the symptoms. The treatment provided significant relief. EGD planned for next month.  Anxiety Presents for follow-up visit. Symptoms include chest pain (occassionally), decreased concentration, nervous/anxious behavior, palpitations and suicidal ideas. Patient reports no dizziness or shortness of breath. Symptoms occur occasionally. The severity of symptoms is moderate.   Compliance with medications: using xanax as needed - #60 tab lasted one year.  Depression        This is a chronic problem.  The problem has been gradually worsening since onset.  Associated symptoms include decreased concentration, fatigue, decreased interest and suicidal ideas.  Associated symptoms include no appetite change, no myalgias and no headaches.  Past treatments include SSRIs - Selective serotonin reuptake inhibitors.  Previous treatment provided mild relief.  Past medical history includes anxiety.   Edema - persistent LE edema s/p DVT.  Seen by Vascular surgery and recommended support stockings.   Diarrhea - seen by GI; colonoscopy is scheduled with Dr. Allen Norris.  Lab Results  Component Value Date   CREATININE  1.18 08/22/2019   BUN 21 08/22/2019   NA 138 08/22/2019   K 4.9 08/22/2019   CL 102 08/22/2019   CO2 25 08/22/2019   Lab Results  Component Value Date   CHOL 191 08/22/2019   HDL 47 08/22/2019   LDLCALC 125 (H) 08/22/2019   TRIG 102 08/22/2019   CHOLHDL 4.1 08/22/2019   No results found for: TSH No results found for: HGBA1C Lab Results  Component Value Date   WBC 7.0 08/22/2019   HGB 17.1 08/22/2019   HCT 49.3 08/22/2019   MCV 89 08/22/2019   PLT 150 08/22/2019   Lab Results  Component Value Date   ALT 24 08/22/2019   AST 23 08/22/2019   ALKPHOS 80 08/22/2019   BILITOT 1.4 (H) 08/22/2019     Review of Systems  Constitutional: Positive for fatigue. Negative for appetite change, chills, diaphoresis and unexpected weight change.  HENT: Positive for hearing loss (left ear, think it may be clogged ). Negative for tinnitus, trouble swallowing and voice change.   Eyes: Negative for visual disturbance.  Respiratory: Negative for choking, shortness of breath and wheezing.   Cardiovascular: Positive for chest pain (occassionally), palpitations and leg swelling (left leg, wears compression sock ).  Gastrointestinal: Positive for diarrhea (IBS) and heartburn. Negative for abdominal pain, blood in stool and constipation.  Genitourinary: Negative for difficulty urinating, dysuria and frequency.  Musculoskeletal: Positive for arthralgias. Negative for back pain and myalgias.  Skin: Negative for color change and rash.       Recently seen by Dermatology for skin survey  Neurological: Negative for dizziness, syncope and headaches.  Hematological: Negative for adenopathy.  Psychiatric/Behavioral: Positive for  decreased concentration, depression, dysphoric mood and suicidal ideas. Negative for sleep disturbance. The patient is nervous/anxious.     Patient Active Problem List   Diagnosis Date Noted  . Current moderate episode of major depressive disorder without prior episode ([place])  09/02/2020  . Varicose veins of right lower extremity with complications 32/67/1245  . Achilles tendinitis 06/16/2020  . Generalized anxiety disorder 08/22/2019  . Gastroesophageal reflux disease with esophagitis 08/22/2019  . SCCA (squamous cell carcinoma) of skin 02/01/2019  . DVT (deep venous thrombosis) ([place]) 01/21/2019  . Sleep disturbance 12/18/2018  . Heartburn   . Benign neoplasm of ascending colon   . Hepatitis B 07/09/2015  . Adaptive colitis 09/19/2013  . Chronic diarrhea 07/31/2013    Allergies  Allergen Reactions  . Azithromycin Hives  . Amoxicillin   . Clindamycin/Lincomycin Diarrhea  . Doxycycline Hives and Diarrhea  . Keflex [Cephalexin] Swelling    Past Surgical History:  Procedure Laterality Date  . COLONOSCOPY  02/15/11   repeat 10 years  . COLONOSCOPY WITH PROPOFOL N/A 08/15/2016   Procedure: COLONOSCOPY WITH PROPOFOL;  Surgeon: Lucilla Lame, MD;  Location: [facility];  Service: Endoscopy;  Laterality: N/A;  . ESOPHAGOGASTRODUODENOSCOPY (EGD) WITH PROPOFOL N/A 08/15/2016   Procedure: ESOPHAGOGASTRODUODENOSCOPY (EGD) WITH PROPOFOL;  Surgeon: Lucilla Lame, MD;  Location: [facility];  Service: Endoscopy;  Laterality: N/A;  . POLYPECTOMY  08/15/2016   Procedure: POLYPECTOMY;  Surgeon: Lucilla Lame, MD;  Location: [facility];  Service: Endoscopy;;    Social History   Tobacco Use  . Smoking status: Never Smoker  . Smokeless tobacco: Never Used  Vaping Use  . Vaping Use: Never used  Substance Use Topics  . Alcohol use: Yes    Alcohol/week: 0.0 standard drinks    Comment: occasional  . Drug use: No     Medication list has been reviewed and updated.  Current Meds  Medication Sig  . acetaminophen (TYLENOL) 500 MG tablet Take 1,000 mg by mouth every 6 (six) hours as needed.  . ALPRAZolam (XANAX) 0.5 MG tablet alprazolam 0.5 mg tablet  . bismuth subsalicylate (PEPTO-BISMOL) 262 MG/15ML suspension Take 30 mLs by mouth every 6 (six)  hours as needed. OTC  . dicyclomine (BENTYL) 20 MG tablet TAKE (1) TABLET BY MOUTH THREE TIMES A DAY BEFORE MEALS.  Marland Kitchen diphenoxylate-atropine (LOMOTIL) 2.5-0.025 MG tablet TAKE (1) TABLET BY MOUTH FOUR TIMES A DAY AS NEEDED FOR DIARRHEA.  . famotidine (PEPCID) 10 MG tablet Take 10 mg by mouth 2 (two) times daily. OTC  . ibuprofen (ADVIL) 600 MG tablet Take 1 tablet (600 mg total) by mouth every 8 (eight) hours as needed.  . Loperamide HCl (IMODIUM PO) Take by mouth as needed.  . meloxicam (MOBIC) 15 MG tablet as needed.     PHQ 2/9 Scores 09/02/2020 02/25/2020 08/22/2019 06/19/2019  PHQ - 2 Score 4 1 0 1  PHQ- 9 Score 10 1 5 9     GAD 7 : Generalized Anxiety Score 09/02/2020 02/25/2020 08/22/2019 06/19/2019  Nervous, Anxious, on Edge 2 3 1 2   Control/stop worrying 2 1 1 3   Worry too much - different things 2 1 1 3   Trouble relaxing 0 2 0 0  Restless 0 0 0 0  Easily annoyed or irritable 2 3 1 3   Afraid - awful might happen 2 1 1 3   Total GAD 7 Score 10 11 5 14   Anxiety Difficulty Not difficult at all Somewhat difficult Not difficult at all Somewhat difficult  BP Readings from Last 3 Encounters:  09/02/20 110/78  06/16/20 133/80  06/01/20 (!) 124/89    Physical Exam Vitals and nursing note reviewed.  Constitutional:      Appearance: Normal appearance. He is well-developed.  HENT:     Head: Normocephalic.     Right Ear: Tympanic membrane, ear canal and external ear normal.     Left Ear: Tympanic membrane, ear canal and external ear normal.     Nose: Nose normal.     Mouth/Throat:     Pharynx: Uvula midline.  Eyes:     Conjunctiva/sclera: Conjunctivae normal.     Pupils: Pupils are equal, round, and reactive to light.  Neck:     Thyroid: No thyromegaly.     Vascular: No carotid bruit.  Cardiovascular:     Rate and Rhythm: Normal rate and regular rhythm.     Heart sounds: Normal heart sounds.  Pulmonary:     Effort: Pulmonary effort is normal.     Breath sounds: Normal  breath sounds. No wheezing.  Chest:     Breasts:        Right: No mass.        Left: No mass.  Abdominal:     General: Bowel sounds are normal.     Palpations: Abdomen is soft.     Tenderness: There is no abdominal tenderness.  Musculoskeletal:        General: Normal range of motion.     Cervical back: Normal range of motion and neck supple.  Lymphadenopathy:     Cervical: No cervical adenopathy.  Skin:    General: Skin is warm and dry.  Neurological:     Mental Status: He is alert and oriented to person, place, and time.     Deep Tendon Reflexes: Reflexes are normal and symmetric.  Psychiatric:        Attention and Perception: Attention normal.        Thought Content: Thought content normal. Thought content does not include suicidal (fleeting thoughts) ideation. Thought content does not include suicidal plan.        Cognition and Memory: Cognition normal.        Judgment: Judgment normal.     Wt Readings from Last 3 Encounters:  09/02/20 268 lb (121.6 kg)  06/16/20 267 lb (121.1 kg)  06/01/20 (!) 270 lb (122.5 kg)    BP 110/78 (BP Location: Right Arm, Patient Position: Sitting)   Pulse 75   Temp 98 F (36.7 C) (Oral)   Ht 5\' 11"  (1.803 m)   Wt 268 lb (121.6 kg)   SpO2 95%   BMI 37.38 kg/m   Assessment and Plan: 1. Annual physical exam Continue to work on diet improvement Regular exercise recommended - Comprehensive metabolic panel - Lipid panel - POCT urinalysis dipstick  2. Colon cancer screening colonoscopy scheduled  3. Generalized anxiety disorder Xanax refilled  4. Gastroesophageal reflux disease with esophagitis, unspecified whether hemorrhage On famotidine without sx of dysphagia but with recurrent gerd EGD scheduled - CBC with Differential/Platelet  5. Varicose veins of right lower extremity with complications Chronic edema - improves overnight Continue elevation and compression stockings  6. Prostate cancer screening DRE deferred -  PSA  7. Current moderate episode of major depressive disorder without prior episode [facility]) Recommend Psych care Patient is provided with a list of providers in the area - if a referral is needed he will call.   Partially dictated using Editor, commissioning. Any errors are unintentional.  Halina Maidens, MD [facility]  09/02/2020

## 2020-09-02 ENCOUNTER — Encounter: Payer: Self-pay | Admitting: Internal Medicine

## 2020-09-02 ENCOUNTER — Other Ambulatory Visit: Payer: Self-pay

## 2020-09-02 ENCOUNTER — Ambulatory Visit (INDEPENDENT_AMBULATORY_CARE_PROVIDER_SITE_OTHER): Payer: 59 | Admitting: Internal Medicine

## 2020-09-02 VITALS — BP 110/78 | HR 75 | Temp 98.0°F | Ht 71.0 in | Wt 268.0 lb

## 2020-09-02 DIAGNOSIS — I83891 Varicose veins of right lower extremities with other complications: Secondary | ICD-10-CM | POA: Diagnosis not present

## 2020-09-02 DIAGNOSIS — Z0000 Encounter for general adult medical examination without abnormal findings: Secondary | ICD-10-CM | POA: Diagnosis not present

## 2020-09-02 DIAGNOSIS — F411 Generalized anxiety disorder: Secondary | ICD-10-CM | POA: Diagnosis not present

## 2020-09-02 DIAGNOSIS — Z23 Encounter for immunization: Secondary | ICD-10-CM | POA: Diagnosis not present

## 2020-09-02 DIAGNOSIS — Z125 Encounter for screening for malignant neoplasm of prostate: Secondary | ICD-10-CM

## 2020-09-02 DIAGNOSIS — K2100 Gastro-esophageal reflux disease with esophagitis, without bleeding: Secondary | ICD-10-CM

## 2020-09-02 DIAGNOSIS — Z1211 Encounter for screening for malignant neoplasm of colon: Secondary | ICD-10-CM

## 2020-09-02 DIAGNOSIS — F321 Major depressive disorder, single episode, moderate: Secondary | ICD-10-CM | POA: Insufficient documentation

## 2020-09-02 LAB — POCT URINALYSIS DIPSTICK
Bilirubin, UA: NEGATIVE
Blood, UA: NEGATIVE
Glucose, UA: NEGATIVE
Ketones, UA: NEGATIVE
Leukocytes, UA: NEGATIVE
Nitrite, UA: NEGATIVE
Protein, UA: NEGATIVE
Spec Grav, UA: 1.02 (ref 1.010–1.025)
Urobilinogen, UA: 0.2 E.U./dL
pH, UA: 6 (ref 5.0–8.0)

## 2020-09-02 MED ORDER — ALPRAZOLAM 0.5 MG PO TABS: 0.2500 mg | ORAL_TABLET | Freq: Two times a day (BID) | ORAL | 2 refills | Status: AC | PRN

## 2020-09-02 NOTE — Patient Instructions (Signed)
[facility]  [phone]  [facility] [phone]  [facility] [phone]  [facility]- [phone]

## 2020-09-03 LAB — COMPREHENSIVE METABOLIC PANEL
ALT: 16 IU/L (ref 0–44)
AST: 18 IU/L (ref 0–40)
Albumin/Globulin Ratio: 1.9 (ref 1.2–2.2)
Albumin: 4.3 g/dL (ref 3.8–4.9)
Alkaline Phosphatase: 85 IU/L (ref 44–121)
BUN/Creatinine Ratio: 17 (ref 9–20)
BUN: 20 mg/dL (ref 6–24)
Bilirubin Total: 1.4 mg/dL — ABNORMAL HIGH (ref 0.0–1.2)
CO2: 22 mmol/L (ref 20–29)
Calcium: 9.4 mg/dL (ref 8.7–10.2)
Chloride: 104 mmol/L (ref 96–106)
Creatinine, Ser: 1.19 mg/dL (ref 0.76–1.27)
GFR calc Af Amer: 77 mL/min/{1.73_m2} (ref >59)
GFR calc non Af Amer: 67 mL/min/{1.73_m2} (ref >59)
Globulin, Total: 2.3 g/dL (ref 1.5–4.5)
Glucose: 101 mg/dL — ABNORMAL HIGH (ref 65–99)
Potassium: 4.6 mmol/L (ref 3.5–5.2)
Sodium: 140 mmol/L (ref 134–144)
Total Protein: 6.6 g/dL (ref 6.0–8.5)

## 2020-09-03 LAB — CBC WITH DIFFERENTIAL/PLATELET
Basophils Absolute: 0.1 10*3/uL (ref 0.0–0.2)
Basos: 1 %
EOS (ABSOLUTE): 0.3 10*3/uL (ref 0.0–0.4)
Eos: 3 %
Hematocrit: 50.2 % (ref 37.5–51.0)
Hemoglobin: 17.2 g/dL (ref 13.0–17.7)
Immature Grans (Abs): 0 10*3/uL (ref 0.0–0.1)
Immature Granulocytes: 1 %
Lymphocytes Absolute: 2.5 10*3/uL (ref 0.7–3.1)
Lymphs: 32 %
MCH: 31.3 pg (ref 26.6–33.0)
MCHC: 34.3 g/dL (ref 31.5–35.7)
MCV: 91 fL (ref 79–97)
Monocytes Absolute: 0.7 10*3/uL (ref 0.1–0.9)
Monocytes: 9 %
Neutrophils Absolute: 4.2 10*3/uL (ref 1.4–7.0)
Neutrophils: 54 %
Platelets: 173 10*3/uL (ref 150–450)
RBC: 5.49 x10E6/uL (ref 4.14–5.80)
RDW: 12.2 % (ref 11.6–15.4)
WBC: 7.8 10*3/uL (ref 3.4–10.8)

## 2020-09-03 LAB — LIPID PANEL
Chol/HDL Ratio: 4.4 ratio (ref 0.0–5.0)
Cholesterol, Total: 184 mg/dL (ref 100–199)
HDL: 42 mg/dL (ref >39)
LDL Chol Calc (NIH): 126 mg/dL — ABNORMAL HIGH (ref 0–99)
Triglycerides: 86 mg/dL (ref 0–149)
VLDL Cholesterol Cal: 16 mg/dL (ref 5–40)

## 2020-09-03 LAB — PSA: Prostate Specific Ag, Serum: 1.1 ng/mL (ref 0.0–4.0)

## 2020-09-09 ENCOUNTER — Encounter: Payer: Self-pay | Admitting: Gastroenterology

## 2020-09-09 ENCOUNTER — Other Ambulatory Visit: Payer: Self-pay

## 2020-09-09 ENCOUNTER — Telehealth: Payer: Self-pay

## 2020-09-09 NOTE — Telephone Encounter (Signed)
Returned patients call. Informed pt his procedure is scheduled for [place]. Pt verbalized understanding.

## 2020-09-10 ENCOUNTER — Other Ambulatory Visit
Admission: RE | Admit: 2020-09-10 | Discharge: 2020-09-10 | Disposition: A | Discharge status: Home / Self Care | Payer: 59 | Source: Ambulatory Visit | Attending: Gastroenterology | Admitting: Gastroenterology

## 2020-09-10 DIAGNOSIS — Z20822 Contact with and (suspected) exposure to covid-19: Secondary | ICD-10-CM | POA: Diagnosis not present

## 2020-09-10 DIAGNOSIS — Z01812 Encounter for preprocedural laboratory examination: Secondary | ICD-10-CM | POA: Insufficient documentation

## 2020-09-10 NOTE — Discharge Instructions (Signed)
General Anesthesia, Adult, Care After This sheet gives you information about how to care for yourself after your procedure. Your health care provider may also give you more specific instructions. If you have problems or questions, contact your health care provider. What can I expect after the procedure? After the procedure, the following side effects are common:  Pain or discomfort at the IV site.  Nausea.  Vomiting.  Sore throat.  Trouble concentrating.  Feeling cold or chills.  Weak or tired.  Sleepiness and fatigue.  Soreness and body aches. These side effects can affect parts of the body that were not involved in surgery. Follow these instructions at home:  For at least 24 hours after the procedure:  Have a responsible adult stay with you. It is important to have someone help care for you until you are awake and alert.  Rest as needed.  Do not: ? Participate in activities in which you could fall or become injured. ? Drive. ? Use heavy machinery. ? Drink alcohol. ? Take sleeping pills or medicines that cause drowsiness. ? Make important decisions or sign legal documents. ? Take care of children on your own. Eating and drinking  Follow any instructions from your health care provider about eating or drinking restrictions.  When you feel hungry, start by eating small amounts of foods that are soft and easy to digest (bland), such as toast. Gradually return to your regular diet.  Drink enough fluid to keep your urine pale yellow.  If you vomit, rehydrate by drinking water, juice, or clear broth. General instructions  If you have sleep apnea, surgery and certain medicines can increase your risk for breathing problems. Follow instructions from your health care provider about wearing your sleep device: ? Anytime you are sleeping, including during daytime naps. ? While taking prescription pain medicines, sleeping medicines, or medicines that make you drowsy.  Return to  your normal activities as told by your health care provider. Ask your health care provider what activities are safe for you.  Take over-the-counter and prescription medicines only as told by your health care provider.  If you smoke, do not smoke without supervision.  Keep all follow-up visits as told by your health care provider. This is important. Contact a health care provider if:  You have nausea or vomiting that does not get better with medicine.  You cannot eat or drink without vomiting.  You have pain that does not get better with medicine.  You are unable to pass urine.  You develop a skin rash.  You have a fever.  You have redness around your IV site that gets worse. Get help right away if:  You have difficulty breathing.  You have chest pain.  You have blood in your urine or stool, or you vomit blood. Summary  After the procedure, it is common to have a sore throat or nausea. It is also common to feel tired.  Have a responsible adult stay with you for the first 24 hours after general anesthesia. It is important to have someone help care for you until you are awake and alert.  When you feel hungry, start by eating small amounts of foods that are soft and easy to digest (bland), such as toast. Gradually return to your regular diet.  Drink enough fluid to keep your urine pale yellow.  Return to your normal activities as told by your health care provider. Ask your health care provider what activities are safe for you. This information is not   intended to replace advice given to you by your health care provider. Make sure you discuss any questions you have with your health care provider. Document Revised: 10/27/2017 Document Reviewed: 06/09/2017 Elsevier Patient Education  2020 Elsevier Inc.  

## 2020-09-11 LAB — SARS CORONAVIRUS 2 (TAT 6-24 HRS): SARS Coronavirus 2: NEGATIVE

## 2020-09-14 ENCOUNTER — Encounter: Payer: Self-pay | Admitting: Gastroenterology

## 2020-09-14 ENCOUNTER — Ambulatory Visit: Payer: 59 | Admitting: Anesthesiology

## 2020-09-14 ENCOUNTER — Ambulatory Visit
Admission: RE | Admit: 2020-09-14 | Discharge: 2020-09-14 | Disposition: A | Discharge status: Home / Self Care | Payer: 59 | Attending: Gastroenterology | Admitting: Gastroenterology

## 2020-09-14 ENCOUNTER — Other Ambulatory Visit: Payer: Self-pay

## 2020-09-14 ENCOUNTER — Encounter
Admission: RE | Disposition: A | Discharge status: Home / Self Care | Payer: Self-pay | Source: Home / Self Care | Attending: Gastroenterology

## 2020-09-14 DIAGNOSIS — Z1211 Encounter for screening for malignant neoplasm of colon: Secondary | ICD-10-CM | POA: Insufficient documentation

## 2020-09-14 DIAGNOSIS — K219 Gastro-esophageal reflux disease without esophagitis: Secondary | ICD-10-CM | POA: Diagnosis not present

## 2020-09-14 DIAGNOSIS — D122 Benign neoplasm of ascending colon: Secondary | ICD-10-CM | POA: Insufficient documentation

## 2020-09-14 DIAGNOSIS — K635 Polyp of colon: Secondary | ICD-10-CM | POA: Diagnosis not present

## 2020-09-14 DIAGNOSIS — Z860100 Personal history of colon polyps, unspecified: Secondary | ICD-10-CM

## 2020-09-14 DIAGNOSIS — K640 First degree hemorrhoids: Secondary | ICD-10-CM | POA: Diagnosis not present

## 2020-09-14 DIAGNOSIS — R12 Heartburn: Secondary | ICD-10-CM | POA: Diagnosis not present

## 2020-09-14 DIAGNOSIS — Z86718 Personal history of other venous thrombosis and embolism: Secondary | ICD-10-CM | POA: Diagnosis not present

## 2020-09-14 HISTORY — PX: ESOPHAGOGASTRODUODENOSCOPY (EGD) WITH PROPOFOL: SHX5813

## 2020-09-14 HISTORY — PX: POLYPECTOMY: SHX5525

## 2020-09-14 HISTORY — DX: Gastro-esophageal reflux disease without esophagitis: K21.9

## 2020-09-14 HISTORY — DX: Unspecified osteoarthritis, unspecified site: M19.90

## 2020-09-14 HISTORY — PX: COLONOSCOPY WITH PROPOFOL: SHX5780

## 2020-09-14 SURGERY — COLONOSCOPY WITH PROPOFOL
Anesthesia: General | Site: Rectum

## 2020-09-14 MED ORDER — PROPOFOL 10 MG/ML IV BOLUS
INTRAVENOUS | Status: DC | PRN
  Administered 2020-09-14: 50 mg via INTRAVENOUS
  Administered 2020-09-14: 150 mg via INTRAVENOUS
  Administered 2020-09-14: 30 mg via INTRAVENOUS
  Administered 2020-09-14: 50 mg via INTRAVENOUS
  Administered 2020-09-14: 30 mg via INTRAVENOUS
  Administered 2020-09-14: 40 mg via INTRAVENOUS

## 2020-09-14 MED ORDER — STERILE WATER FOR IRRIGATION IR SOLN: Status: DC | PRN

## 2020-09-14 MED ORDER — LIDOCAINE HCL (CARDIAC) PF 100 MG/5ML IV SOSY
PREFILLED_SYRINGE | INTRAVENOUS | Status: DC | PRN
  Administered 2020-09-14: 50 mg via INTRAVENOUS

## 2020-09-14 MED ORDER — LACTATED RINGERS IV SOLN: INTRAVENOUS | Status: DC

## 2020-09-14 MED ORDER — ACETAMINOPHEN 160 MG/5ML PO SOLN: 325.0000 mg | Freq: Once | ORAL | Status: DC

## 2020-09-14 MED ORDER — ACETAMINOPHEN 325 MG PO TABS: 325.0000 mg | ORAL_TABLET | Freq: Once | ORAL | Status: DC

## 2020-09-14 SURGICAL SUPPLY — 8 items
BLOCK BITE 60FR ADLT L/F GRN (MISCELLANEOUS) ×4 IMPLANT
FORCEPS BIOP RAD 4 LRG CAP 4 (CUTTING FORCEPS) ×4 IMPLANT
GOWN CVR UNV OPN BCK APRN NK (MISCELLANEOUS) ×4 IMPLANT
GOWN ISOL THUMB LOOP REG UNIV (MISCELLANEOUS) ×8
KIT PRC NS LF DISP ENDO (KITS) ×2 IMPLANT
KIT PROCEDURE OLYMPUS (KITS) ×4
MANIFOLD NEPTUNE II (INSTRUMENTS) ×4 IMPLANT
WATER STERILE IRR 250ML POUR (IV SOLUTION) ×4 IMPLANT

## 2020-09-14 NOTE — Transfer of Care (Signed)
Immediate Anesthesia Transfer of Care Note  Patient: Derrick Galvan.  Procedure(s) Performed: COLONOSCOPY WITH BIOPSY (N/A Rectum) ESOPHAGOGASTRODUODENOSCOPY (EGD) WITH PROPOFOL (N/A ) POLYPECTOMY (N/A Rectum)  Patient Location: PACU  Anesthesia Type: General  Level of Consciousness: awake, alert  and patient cooperative  Airway and Oxygen Therapy: Patient Spontanous Breathing and Patient connected to supplemental oxygen  Post-op Assessment: Post-op Vital signs reviewed, Patient's Cardiovascular Status Stable, Respiratory Function Stable, Patent Airway and No signs of Nausea or vomiting  Post-op Vital Signs: Reviewed and stable  Complications: No complications documented.

## 2020-09-14 NOTE — Anesthesia Preprocedure Evaluation (Signed)
Anesthesia Evaluation  Patient identified by MRN, date of birth, ID band Patient awake    Reviewed: Allergy & Precautions, H&P , NPO status , Patient's Chart, lab work & pertinent test results  Airway Mallampati: II  TM Distance: >3 FB Neck ROM: full    Dental no notable dental hx.    Pulmonary    Pulmonary exam normal breath sounds clear to auscultation       Cardiovascular Normal cardiovascular exam Rhythm:regular Rate:Normal     Neuro/Psych PSYCHIATRIC DISORDERS    GI/Hepatic GERD  ,  Endo/Other    Renal/GU      Musculoskeletal   Abdominal   Peds  Hematology   Anesthesia Other Findings   Reproductive/Obstetrics                             Anesthesia Physical Anesthesia Plan  ASA: II  Anesthesia Plan: General   Post-op Pain Management:    Induction: Intravenous  PONV Risk Score and Plan: 2 and Treatment may vary due to age or medical condition, TIVA and Propofol infusion  Airway Management Planned: Natural Airway  Additional Equipment:   Intra-op Plan:   Post-operative Plan:   Informed Consent: I have reviewed the patients History and Physical, chart, labs and discussed the procedure including the risks, benefits and alternatives for the proposed anesthesia with the patient or authorized representative who has indicated his/her understanding and acceptance.     Dental Advisory Given  Plan Discussed with: CRNA  Anesthesia Plan Comments:         Anesthesia Quick Evaluation

## 2020-09-14 NOTE — Anesthesia Postprocedure Evaluation (Signed)
Anesthesia Post Note  Patient: Derrick Galvan.  Procedure(s) Performed: COLONOSCOPY WITH BIOPSY (N/A Rectum) ESOPHAGOGASTRODUODENOSCOPY (EGD) WITH PROPOFOL (N/A ) POLYPECTOMY (N/A Rectum)     Patient location during evaluation: PACU Anesthesia Type: General Level of consciousness: awake and alert and oriented Pain management: satisfactory to patient Vital Signs Assessment: post-procedure vital signs reviewed and stable Respiratory status: spontaneous breathing, nonlabored ventilation and respiratory function stable Cardiovascular status: blood pressure returned to baseline and stable Postop Assessment: Adequate PO intake and No signs of nausea or vomiting Anesthetic complications: no   No complications documented.  Raliegh Ip

## 2020-09-14 NOTE — H&P (Signed)
Derrick Lame, MD [facility] [address] [place], [place] [address] [phone] Fax : [phone]  Primary Care Physician:  Glean Hess, MD Primary Gastroenterologist:  Dr. Allen Norris  Pre-Procedure History & Physical: HPI:  Tereso Unangst. is a 59 y.o. male is here for an endoscopy and colonoscopy.   Past Medical History:  Diagnosis Date  . Acute gastritis without hemorrhage   . Adaptive colitis 09/19/2013  . Arthritis    knees  . Chronic diarrhea 07/31/2013  . DVT (deep venous thrombosis) ([place]) 12/21/2018   LLL  . GERD (gastroesophageal reflux disease)   . Hepatitis B 1989   resolved  . Skin cancer    on face - removed under local    Past Surgical History:  Procedure Laterality Date  . COLONOSCOPY  02/15/11   repeat 10 years  . COLONOSCOPY WITH PROPOFOL N/A 08/15/2016   Procedure: COLONOSCOPY WITH PROPOFOL;  Surgeon: Derrick Lame, MD;  Location: [facility];  Service: Endoscopy;  Laterality: N/A;  . ESOPHAGOGASTRODUODENOSCOPY (EGD) WITH PROPOFOL N/A 08/15/2016   Procedure: ESOPHAGOGASTRODUODENOSCOPY (EGD) WITH PROPOFOL;  Surgeon: Derrick Lame, MD;  Location: [facility];  Service: Endoscopy;  Laterality: N/A;  . POLYPECTOMY  08/15/2016   Procedure: POLYPECTOMY;  Surgeon: Derrick Lame, MD;  Location: [facility];  Service: Endoscopy;;    Prior to Admission medications   Medication Sig Start Date End Date Taking? Authorizing Provider  acetaminophen (TYLENOL) 500 MG tablet Take 1,000 mg by mouth every 6 (six) hours as needed.   Yes [provider]  ALPRAZolam Duanne Moron) 0.5 MG tablet Take 0.5 tablets (0.25 mg total) by mouth 2 (two) times daily as needed for anxiety. 09/02/20 10/02/20 Yes Glean Hess, MD  bismuth subsalicylate (PEPTO-BISMOL) 262 MG/15ML suspension Take 30 mLs by mouth every 6 (six) hours as needed. OTC   Yes [provider]  dicyclomine (BENTYL) 20 MG tablet TAKE (1) TABLET BY MOUTH THREE TIMES A DAY  BEFORE MEALS. 06/03/20  Yes Caidin Heidenreich, MD  diphenoxylate-atropine (LOMOTIL) 2.5-0.025 MG tablet TAKE (1) TABLET BY MOUTH FOUR TIMES A DAY AS NEEDED FOR DIARRHEA. 06/03/20  Yes Derrick Lame, MD  famotidine (PEPCID) 10 MG tablet Take 10 mg by mouth 2 (two) times daily. OTC   Yes [provider]  ibuprofen (ADVIL) 600 MG tablet Take 1 tablet (600 mg total) by mouth every 8 (eight) hours as needed. 02/25/20  Yes Glean Hess, MD  Loperamide HCl (IMODIUM PO) Take by mouth as needed.   Yes [provider]  omeprazole (PRILOSEC) 20 MG capsule Take 20 mg by mouth every other day.   Yes [provider]  Efinaconazole (JUBLIA) 10 % SOLN Apply topically. Patient not taking: Reported on 09/02/2020    [provider]  meloxicam (MOBIC) 15 MG tablet as needed.  Patient not taking: Reported on 09/09/2020    [provider]  Na Sulfate-K Sulfate-Mg Sulf (SUPREP BOWEL PREP KIT) 17.5-3.13-1.6 GM/177ML SOLN Take 1 kit by mouth as directed. Patient not taking: Reported on 09/02/2020 08/27/20   Derrick Lame, MD  triamcinolone cream (KENALOG) 0.1 % Apply 1 application topically as needed. HIVES from Azithromy reaction.  Patient not taking: Reported on 09/02/2020    [provider]    Allergies as of 08/27/2020 - Review Complete 06/16/2020  Allergen Reaction Noted  . Azithromycin Hives 11/08/2018  . Amoxicillin  06/16/2020  . Clindamycin/lincomycin Diarrhea 02/01/2019  . Doxycycline Hives and Diarrhea 02/01/2019  . Keflex [cephalexin] Swelling 01/10/2019  Family History  Problem Relation Age of Onset  . Healthy Mother   . COPD Father     Social History   Socioeconomic History  . Marital status: Single    Spouse name: Not on file  . Number of children: 0  . Years of education: Not on file  . Highest education level: Not on file  Occupational History  . Occupation: Freight forwarder  Tobacco Use  . Smoking status: Never Smoker  . Smokeless tobacco:  Never Used  Vaping Use  . Vaping Use: Never used  Substance and Sexual Activity  . Alcohol use: Yes    Alcohol/week: 0.0 standard drinks    Comment: occasional  . Drug use: No  . Sexual activity: Yes  Other Topics Concern  . Not on file  Social History Narrative  . Not on file   Social Determinants of Health   Financial Resource Strain:   . Difficulty of Paying Living Expenses: Not on file  Food Insecurity:   . Worried About Charity fundraiser in the Last Year: Not on file  . Ran Out of Food in the Last Year: Not on file  Transportation Needs:   . Lack of Transportation (Medical): Not on file  . Lack of Transportation (Non-Medical): Not on file  Physical Activity:   . Days of Exercise per Week: Not on file  . Minutes of Exercise per Session: Not on file  Stress:   . Feeling of Stress : Not on file  Social Connections:   . Frequency of Communication with Friends and Family: Not on file  . Frequency of Social Gatherings with Friends and Family: Not on file  . Attends Religious Services: Not on file  . Active Member of Clubs or Organizations: Not on file  . Attends Archivist Meetings: Not on file  . Marital Status: Not on file  Intimate Partner Violence:   . Fear of Current or Ex-Partner: Not on file  . Emotionally Abused: Not on file  . Physically Abused: Not on file  . Sexually Abused: Not on file    Review of Systems: See HPI, otherwise negative ROS  Physical Exam: BP (!) 152/88   Pulse 90   Temp 97.8 F (36.6 C) (Temporal)   Resp 16   Ht 5' 11"  (1.803 m)   Wt 117.9 kg   SpO2 97%   BMI 36.26 kg/m  General:   Alert,  pleasant and cooperative in NAD Head:  Normocephalic and atraumatic. Neck:  Supple; no masses or thyromegaly. Lungs:  Clear throughout to auscultation.    Heart:  Regular rate and rhythm. Abdomen:  Soft, nontender and nondistended. Normal bowel sounds, without guarding, and without rebound.   Neurologic:  Alert and  oriented x4;   grossly normal neurologically.  Impression/Plan: Urbano Milhouse. is here for an endoscopy and colonoscopy to be performed for GERD and an adenomatous polyp on colonoscopy of 08/2016  Risks, benefits, limitations, and alternatives regarding  endoscopy and colonoscopy have been reviewed with the patient.  Questions have been answered.  All parties agreeable.   Derrick Lame, MD  09/14/2020, 9:25 AM

## 2020-09-14 NOTE — Op Note (Signed)
[facility] Gastroenterology Patient Name: Derrick Galvan Procedure Date: 09/14/2020 9:31 AM MRN: [id number] Account #: [id number] Date of Birth: [birth year] Admit Type: Outpatient Age: 59 Room: [facility] OR ROOM 01 Gender: Male Note Status: Finalized Procedure:             Colonoscopy Indications:           High risk colon cancer surveillance: Personal history                         of colonic polyps Providers:             Lucilla Lame MD, MD Referring MD:          Halina Maidens, MD (Referring MD) Medicines:             Propofol per Anesthesia Complications:         No immediate complications. Procedure:             Pre-Anesthesia Assessment:                        - Prior to the procedure, a History and Physical was                         performed, and patient medications and allergies were                         reviewed. The patient's tolerance of previous                         anesthesia was also reviewed. The risks and benefits                         of the procedure and the sedation options and risks                         were discussed with the patient. All questions were                         answered, and informed consent was obtained. Prior                         Anticoagulants: The patient has taken no previous                         anticoagulant or antiplatelet agents. ASA Grade                         Assessment: II - A patient with mild systemic disease.                         After reviewing the risks and benefits, the patient                         was deemed in satisfactory condition to undergo the                         procedure.                        -  Prior to the procedure, a History and Physical was                         performed, and patient medications and allergies were                         reviewed. The patient's tolerance of previous                         anesthesia was also reviewed. The risks and benefits                          of the procedure and the sedation options and risks                         were discussed with the patient. All questions were                         answered, and informed consent was obtained. Prior                         Anticoagulants: The patient has taken no previous                         anticoagulant or antiplatelet agents. ASA Grade                         Assessment: II - A patient with mild systemic disease.                         After reviewing the risks and benefits, the patient                         was deemed in satisfactory condition to undergo the                         procedure.                        After obtaining informed consent, the colonoscope was                         passed under direct vision. Throughout the procedure,                         the patient's blood pressure, pulse, and oxygen                         saturations were monitored continuously. The was                         introduced through the anus and advanced to the the                         cecum, identified by appendiceal orifice and ileocecal                         valve. The colonoscopy was performed without  difficulty. The patient tolerated the procedure well.                         The quality of the bowel preparation was excellent. Findings:      The perianal and digital rectal examinations were normal.      Two sessile polyps were found in the ascending colon. The polyps were 2       to 3 mm in size. These polyps were removed with a cold biopsy forceps.       Resection and retrieval were complete.      A 3 mm polyp was found in the transverse colon. The polyp was sessile.       The polyp was removed with a cold biopsy forceps. Resection and       retrieval were complete.      Non-bleeding internal hemorrhoids were found during retroflexion. The       hemorrhoids were Grade I (internal hemorrhoids that do not prolapse). Impression:             - Two 2 to 3 mm polyps in the ascending colon, removed                         with a cold biopsy forceps. Resected and retrieved.                        - One 3 mm polyp in the transverse colon, removed with                         a cold biopsy forceps. Resected and retrieved.                        - Non-bleeding internal hemorrhoids. Recommendation:        - Discharge patient to home.                        - Resume previous diet.                        - Continue present medications.                        - Repeat colonoscopy in 5 years for surveillance. Procedure Code(s):     --- Professional ---                        [phone], Colonoscopy, flexible; with biopsy, single or                         multiple Diagnosis Code(s):     --- Professional ---                        Z86.010, Personal history of colonic polyps                        K63.5, Polyp of colon CPT copyright 2019 American Medical Association. All rights reserved. The codes documented in this report are preliminary and upon coder review may  be revised to meet current compliance requirements. Lucilla Lame MD, MD 09/14/2020 9:56:18 AM This report has been signed electronically. Number of Addenda: 0 Note  Initiated On: 09/14/2020 9:31 AM Scope Withdrawal Time: 0 hours 7 minutes 54 seconds  Total Procedure Duration: 0 hours 9 minutes 12 seconds  Estimated Blood Loss:  Estimated blood loss: none.      [facility]

## 2020-09-14 NOTE — Op Note (Signed)
[facility] Gastroenterology Patient Name: Derrick Galvan Procedure Date: 09/14/2020 9:31 AM MRN: [id number] Account #: [id number] Date of Birth: Apr 02, 1961 Admit Type: Outpatient Age: 59 Room: [facility] OR ROOM 01 Gender: Male Note Status: Finalized Procedure:             Upper GI endoscopy Indications:           Heartburn Providers:             Lucilla Lame MD, MD Referring MD:          Halina Maidens, MD (Referring MD) Medicines:             Propofol per Anesthesia Complications:         No immediate complications. Procedure:             Pre-Anesthesia Assessment:                        - Prior to the procedure, a History and Physical was                         performed, and patient medications and allergies were                         reviewed. The patient's tolerance of previous                         anesthesia was also reviewed. The risks and benefits                         of the procedure and the sedation options and risks                         were discussed with the patient. All questions were                         answered, and informed consent was obtained. Prior                         Anticoagulants: The patient has taken no previous                         anticoagulant or antiplatelet agents. ASA Grade                         Assessment: II - A patient with mild systemic disease.                         After reviewing the risks and benefits, the patient                         was deemed in satisfactory condition to undergo the                         procedure.                        After obtaining informed consent, the endoscope was  passed under direct vision. Throughout the procedure,                         the patient's blood pressure, pulse, and oxygen                         saturations were monitored continuously. The was                         introduced through the mouth, and advanced to the                          second part of duodenum. The upper GI endoscopy was                         accomplished without difficulty. The patient tolerated                         the procedure well. Findings:      The examined esophagus was normal.      The stomach was normal.      The examined duodenum was normal. Impression:            - Normal esophagus.                        - Normal stomach.                        - Normal examined duodenum.                        - No specimens collected. Recommendation:        - Discharge patient to home.                        - Resume previous diet.                        - Continue present medications.                        - Perform a colonoscopy today. Procedure Code(s):     --- Professional ---                        [phone], Esophagogastroduodenoscopy, flexible,                         transoral; diagnostic, including collection of                         specimen(s) by brushing or washing, when performed                         (separate procedure) Diagnosis Code(s):     --- Professional ---                        R12, Heartburn CPT copyright 2019 American Medical Association. All rights reserved. The codes documented in this report are preliminary and upon coder review may  be revised to meet current compliance requirements. Lucilla Lame MD, MD  09/14/2020 9:42:42 AM This report has been signed electronically. Number of Addenda: 0 Note Initiated On: 09/14/2020 9:31 AM Estimated Blood Loss:  Estimated blood loss: none.      [facility]

## 2020-09-15 ENCOUNTER — Encounter: Payer: Self-pay | Admitting: Gastroenterology

## 2020-09-15 LAB — SURGICAL PATHOLOGY

## 2020-10-05 ENCOUNTER — Encounter: Payer: Self-pay | Admitting: Gastroenterology

## 2020-10-09 ENCOUNTER — Other Ambulatory Visit: Payer: Self-pay

## 2020-10-09 DIAGNOSIS — K589 Irritable bowel syndrome without diarrhea: Secondary | ICD-10-CM

## 2020-12-15 ENCOUNTER — Ambulatory Visit (INDEPENDENT_AMBULATORY_CARE_PROVIDER_SITE_OTHER): Payer: 59 | Admitting: Vascular Surgery

## 2020-12-15 ENCOUNTER — Encounter (INDEPENDENT_AMBULATORY_CARE_PROVIDER_SITE_OTHER): Payer: 59

## 2020-12-30 ENCOUNTER — Encounter (INDEPENDENT_AMBULATORY_CARE_PROVIDER_SITE_OTHER): Payer: 59

## 2020-12-30 ENCOUNTER — Ambulatory Visit (INDEPENDENT_AMBULATORY_CARE_PROVIDER_SITE_OTHER): Payer: 59

## 2020-12-30 ENCOUNTER — Ambulatory Visit (INDEPENDENT_AMBULATORY_CARE_PROVIDER_SITE_OTHER): Payer: 59 | Admitting: Nurse Practitioner

## 2020-12-30 ENCOUNTER — Encounter (INDEPENDENT_AMBULATORY_CARE_PROVIDER_SITE_OTHER): Payer: Self-pay | Admitting: Nurse Practitioner

## 2020-12-30 ENCOUNTER — Other Ambulatory Visit: Payer: Self-pay

## 2020-12-30 VITALS — BP 134/84 | HR 79 | Ht 71.0 in | Wt 265.0 lb

## 2020-12-30 DIAGNOSIS — I82432 Acute embolism and thrombosis of left popliteal vein: Secondary | ICD-10-CM

## 2020-12-30 DIAGNOSIS — I890 Lymphedema, not elsewhere classified: Secondary | ICD-10-CM

## 2020-12-30 DIAGNOSIS — I83891 Varicose veins of right lower extremities with other complications: Secondary | ICD-10-CM | POA: Diagnosis not present

## 2021-01-03 NOTE — Progress Notes (Signed)
Subjective:    Patient ID: Derrick Galvan., male    DOB: 02/12/1961, 60 y.o.   MRN: [id number] Chief Complaint  Patient presents with  . Follow-up    6 Mo U/S    The patient presents today for follow-up following evaluation of varicosities and edema following DVT in February 2020.  Since that time the patient has continued to have issues with lower extremity edema despite utilizing medical grade 1 compression, elevation and exercise daily.  The patient has some varicosities on his right lower extremity that are more prominent and tenderness at the end of the day.  The left lower extremity continues to have issues with edema.  The patient has not been on anticoagulation or 81 mg aspirin and has not had any recurrence of DVT.  He denies any wounds or ulcerations.  Today noninvasive studies show evidence of reflux in the left common femoral vein.  No evidence of superficial venous reflux seen in the great or short saphenous veins bilaterally.  No deep venous insufficiency seen in the right lower extremity.  No evidence of superficial venous thrombosis seen bilaterally.  The left popliteal vein shows partial compression with a chronic appearing DVT which is improved from previous studies.   Review of Systems  Cardiovascular: Positive for leg swelling.  All other systems reviewed and are negative.      Objective:   Physical Exam Vitals reviewed.  HENT:     Head: Normocephalic.  Cardiovascular:     Rate and Rhythm: Normal rate.  Pulmonary:     Effort: Pulmonary effort is normal.  Musculoskeletal:     Right lower leg: Edema present.     Left lower leg: Edema present.  Skin:    General: Skin is warm and dry.  Neurological:     Mental Status: He is alert and oriented to person, place, and time.  Psychiatric:        Mood and Affect: Mood normal.        Behavior: Behavior normal.        Thought Content: Thought content normal.        Judgment: Judgment normal.     BP 134/84    Pulse 79   Ht 5' 11" (1.803 m)   Wt 265 lb (120.2 kg)   BMI 36.96 kg/m   Past Medical History:  Diagnosis Date  . Acute gastritis without hemorrhage   . Adaptive colitis 09/19/2013  . Arthritis    knees  . Chronic diarrhea 07/31/2013  . DVT (deep venous thrombosis) ([place]) 12/21/2018   LLL  . GERD (gastroesophageal reflux disease)   . Hepatitis B 1989   resolved  . Skin cancer    on face - removed under local    Social History   Socioeconomic History  . Marital status: Single    Spouse name: Not on file  . Galvan of children: 0  . Years of education: Not on file  . Highest education level: Not on file  Occupational History  . Occupation: Freight forwarder  Tobacco Use  . Smoking status: Never Smoker  . Smokeless tobacco: Never Used  Vaping Use  . Vaping Use: Never used  Substance and Sexual Activity  . Alcohol use: Yes    Alcohol/week: 0.0 standard drinks    Comment: occasional  . Drug use: No  . Sexual activity: Yes  Other Topics Concern  . Not on file  Social History Narrative  . Not on file   Social Determinants of  Health   Financial Resource Strain: Not on file  Food Insecurity: Not on file  Transportation Needs: Not on file  Physical Activity: Not on file  Stress: Not on file  Social Connections: Not on file  Intimate Partner Violence: Not on file    Past Surgical History:  Procedure Laterality Date  . COLONOSCOPY  02/15/11   repeat 10 years  . COLONOSCOPY WITH PROPOFOL N/A 08/15/2016   Procedure: COLONOSCOPY WITH PROPOFOL;  Surgeon: Lucilla Lame, MD;  Location: [place];  Service: Endoscopy;  Laterality: N/A;  . COLONOSCOPY WITH PROPOFOL N/A 09/14/2020   Procedure: COLONOSCOPY WITH BIOPSY;  Surgeon: Lucilla Lame, MD;  Location: [place];  Service: Endoscopy;  Laterality: N/A;  . ESOPHAGOGASTRODUODENOSCOPY (EGD) WITH PROPOFOL N/A 08/15/2016   Procedure: ESOPHAGOGASTRODUODENOSCOPY (EGD) WITH PROPOFOL;  Surgeon: Lucilla Lame, MD;  Location:  [place];  Service: Endoscopy;  Laterality: N/A;  . ESOPHAGOGASTRODUODENOSCOPY (EGD) WITH PROPOFOL N/A 09/14/2020   Procedure: ESOPHAGOGASTRODUODENOSCOPY (EGD) WITH PROPOFOL;  Surgeon: Lucilla Lame, MD;  Location: [place];  Service: Endoscopy;  Laterality: N/A;  . POLYPECTOMY  08/15/2016   Procedure: POLYPECTOMY;  Surgeon: Lucilla Lame, MD;  Location: [place];  Service: Endoscopy;;  . POLYPECTOMY N/A 09/14/2020   Procedure: POLYPECTOMY;  Surgeon: Lucilla Lame, MD;  Location: [place];  Service: Endoscopy;  Laterality: N/A;    Family History  Problem Relation Age of Onset  . Healthy Mother   . COPD Father     Allergies  Allergen Reactions  . Azithromycin Hives  . Amoxicillin   . Clindamycin/Lincomycin Diarrhea  . Doxycycline Hives and Diarrhea  . Keflex [Cephalexin] Swelling    CBC Latest Ref Rng & Units 09/02/2020 08/22/2019 02/19/2019  WBC 3.4 - 10.8 x10E3/uL 7.8 7.0 8.8  Hemoglobin 13.0 - 17.7 g/dL 17.2 17.1 16.5  Hematocrit 37.5 - 51.0 % 50.2 49.3 49.8  Platelets 150 - 450 x10E3/uL 173 150 173      CMP     Component Value Date/Time   NA 140 09/02/2020 0905   K 4.6 09/02/2020 0905   CL 104 09/02/2020 0905   CO2 22 09/02/2020 0905   GLUCOSE 101 (H) 09/02/2020 0905   BUN 20 09/02/2020 0905   CREATININE 1.19 09/02/2020 0905   CALCIUM 9.4 09/02/2020 0905   PROT 6.6 09/02/2020 0905   ALBUMIN 4.3 09/02/2020 0905   AST 18 09/02/2020 0905   ALT 16 09/02/2020 0905   ALKPHOS 85 09/02/2020 0905   BILITOT 1.4 (H) 09/02/2020 0905   GFRNONAA 67 09/02/2020 0905   GFRAA 77 09/02/2020 0905     No results found.     Assessment & Plan:   1. Lymphedema Recommend:  No surgery or intervention at this point in time.    I have reviewed my previous discussion with the patient regarding post phlebitic swelling and why it causes symptoms.  Patient will continue wearing graduated compression stockings class 1 (20-30 mmHg) on a daily  basis. The patient will  beginning wearing the stockings first thing in the morning and removing them in the evening. The patient is instructed specifically not to sleep in the stockings.    In addition, behavioral modification including several periods of elevation of the lower extremities during the day will be continued.  This was reviewed with the patient during the initial visit.  The patient will also continue routine exercise, especially walking on a daily basis as was discussed during the initial visit.    Despite conservative treatments  of at leasr four weeks including graduated compression therapy class 1 and behavioral modification including exercise and elevation the patient  has not obtained adequate control of the lymphedema.  The patient still has stage 3 lymphedema and therefore, I believe that a lymph pump should be added to improve the control of the patient's lymphedema.  Additionally, a lymph pump is warranted because it will reduce the risk of cellulitis and ulceration in the future.  Patient should follow-up in six months    2. Deep vein thrombosis (DVT) of popliteal vein of left lower extremity, unspecified chronicity (HCC) Patient has been doing well off anticoagulation.  Noninvasive studies show today that previous chronic DVT is improving.  Patient will continue with conservative therapies as outlined above.  Patient will follow up with [place] if there is concern for recurrent DVT.   Current Outpatient Medications on File Prior to Visit  Medication Sig Dispense Refill  . acetaminophen (TYLENOL) 500 MG tablet Take 1,000 mg by mouth every 6 (six) hours as needed.    . bismuth subsalicylate (PEPTO BISMOL) 262 MG/15ML suspension Take 30 mLs by mouth every 6 (six) hours as needed. OTC    . dicyclomine (BENTYL) 20 MG tablet TAKE (1) TABLET BY MOUTH THREE TIMES A DAY BEFORE MEALS. 90 tablet 3  . diphenoxylate-atropine (LOMOTIL) 2.5-0.025 MG tablet TAKE (1) TABLET BY MOUTH FOUR  TIMES A DAY AS NEEDED FOR DIARRHEA. 120 tablet 6  . Efinaconazole (JUBLIA) 10 % SOLN Apply topically.    . famotidine (PEPCID) 10 MG tablet Take 10 mg by mouth 2 (two) times daily. OTC    . ibuprofen (ADVIL) 600 MG tablet Take 1 tablet (600 mg total) by mouth every 8 (eight) hours as needed. 100 tablet 5  . Loperamide HCl (IMODIUM PO) Take by mouth as needed.    . meloxicam (MOBIC) 15 MG tablet as needed.    . Na Sulfate-K Sulfate-Mg Sulf (SUPREP BOWEL PREP KIT) 17.5-3.13-1.6 GM/177ML SOLN Take 1 kit by mouth as directed. 354 mL 0  . omeprazole (PRILOSEC) 20 MG capsule Take 20 mg by mouth every other day.    . triamcinolone cream (KENALOG) 0.1 % Apply 1 application topically as needed. HIVES from Azithromy reaction.     No current facility-administered medications on file prior to visit.    There are no Patient Instructions on file for this visit. No follow-ups on file.   Kris Hartmann, NP

## 2021-01-06 ENCOUNTER — Telehealth: Payer: Self-pay | Admitting: Gastroenterology

## 2021-01-06 ENCOUNTER — Other Ambulatory Visit: Payer: Self-pay

## 2021-01-06 DIAGNOSIS — K529 Noninfective gastroenteritis and colitis, unspecified: Secondary | ICD-10-CM

## 2021-01-06 NOTE — Telephone Encounter (Signed)
Stool test have been order for pt prior to appt next week. Pt notified.

## 2021-01-06 NOTE — Telephone Encounter (Signed)
Patient LVM asking if he can have a stool test before his appointment with Dr Allen Norris

## 2021-01-12 ENCOUNTER — Telehealth: Payer: Self-pay

## 2021-01-12 LAB — GI PROFILE, STOOL, PCR

## 2021-01-12 LAB — OVA AND PARASITE EXAMINATION

## 2021-01-12 NOTE — Telephone Encounter (Signed)
-----   Message from Lucilla Lame, MD sent at 01/12/2021  7:15 AM EST ----- Let the patient know the stool tests were all negative for any pathogens.

## 2021-01-12 NOTE — Telephone Encounter (Signed)
Pt has an office appt tomorrow, 01/13/21. Results will be discussed during this visit.

## 2021-01-13 ENCOUNTER — Ambulatory Visit (INDEPENDENT_AMBULATORY_CARE_PROVIDER_SITE_OTHER): Payer: 59 | Admitting: Gastroenterology

## 2021-01-13 ENCOUNTER — Other Ambulatory Visit: Payer: Self-pay

## 2021-01-13 ENCOUNTER — Encounter: Payer: Self-pay | Admitting: Gastroenterology

## 2021-01-13 DIAGNOSIS — R112 Nausea with vomiting, unspecified: Secondary | ICD-10-CM | POA: Diagnosis not present

## 2021-01-13 DIAGNOSIS — K589 Irritable bowel syndrome without diarrhea: Secondary | ICD-10-CM

## 2021-01-13 MED ORDER — DESIPRAMINE HCL 25 MG PO TABS: 25.0000 mg | ORAL_TABLET | Freq: Every day | ORAL | 3 refills | Status: DC

## 2021-01-13 MED ORDER — DICYCLOMINE HCL 20 MG PO TABS: ORAL_TABLET | ORAL | 3 refills | Status: DC

## 2021-01-13 MED ORDER — DIPHENOXYLATE-ATROPINE 2.5-0.025 MG PO TABS: ORAL_TABLET | ORAL | 6 refills | Status: DC

## 2021-01-13 NOTE — Progress Notes (Signed)
Primary Care Physician: Glean Hess, MD  Primary Gastroenterologist:  Dr. Lucilla Lame  Chief Complaint  Patient presents with  . Diarrhea  . Abdominal Cramping    HPI: Derrick Galvan. is a 60 y.o. male here with abdominal cramps and diarrhea.  The patient had a colonoscopy by me a few months ago in November with 2 polyps being removed that were adenomas.  The patient also had an upper endoscopy at that time for GERD which was a normal exam.  The patient had called the office with his reports of abdominal discomfort and cramps with diarrhea and he had stool sample sent off.  The patient was negative for any pathogens on the GI panel and no signs of ova or parasites in the stool.  The patient follows with vascular surgery for lymphedema and DVTs.  The patient has a long history of irritable bowel syndrome with diarrhea and has followed with me for many years for the symptoms.  The patient takes Bentyl and Lomotil in addition to Imodium.   The patient reports that he recently had a very bad bout of diarrhea with abdominal cramps and the cramps were new to him.  He thinks it may have been related to something he may have eaten.  The patient still has diarrhea and has done some Internet research and was wondering if he could have problems with his gallbladder since he had an ejection fraction of 19% 10 years ago and if that was causing his diarrhea and abdominal pain.  The abdominal pain is both on the right and left side.  He also was reading and had questions about the possibility of him having Crohn's disease.  This despite him having intermittent bouts of constipation without any rectal bleeding or bloody stools.  Past Medical History:  Diagnosis Date  . Acute gastritis without hemorrhage   . Adaptive colitis 09/19/2013  . Arthritis    knees  . Chronic diarrhea 07/31/2013  . DVT (deep venous thrombosis) ([place]) 12/21/2018   LLL  . GERD (gastroesophageal reflux disease)   . Hepatitis B  1989   resolved  . Skin cancer    on face - removed under local    Current Outpatient Medications  Medication Sig Dispense Refill  . acetaminophen (TYLENOL) 500 MG tablet Take 1,000 mg by mouth every 6 (six) hours as needed.    . bismuth subsalicylate (PEPTO BISMOL) 262 MG/15ML suspension Take 30 mLs by mouth every 6 (six) hours as needed. OTC    . dicyclomine (BENTYL) 20 MG tablet TAKE (1) TABLET BY MOUTH THREE TIMES A DAY BEFORE MEALS. 90 tablet 3  . diphenoxylate-atropine (LOMOTIL) 2.5-0.025 MG tablet TAKE (1) TABLET BY MOUTH FOUR TIMES A DAY AS NEEDED FOR DIARRHEA. 120 tablet 6  . Efinaconazole (JUBLIA) 10 % SOLN Apply topically.    . famotidine (PEPCID) 10 MG tablet Take 10 mg by mouth 2 (two) times daily. OTC    . ibuprofen (ADVIL) 600 MG tablet Take 1 tablet (600 mg total) by mouth every 8 (eight) hours as needed. 100 tablet 5  . Loperamide HCl (IMODIUM PO) Take by mouth as needed.    . meloxicam (MOBIC) 15 MG tablet as needed.    Marland Kitchen omeprazole (PRILOSEC) 20 MG capsule Take 20 mg by mouth every other day.    . triamcinolone cream (KENALOG) 0.1 % Apply 1 application topically as needed. HIVES from Azithromy reaction.     No current facility-administered medications for this visit.  Allergies as of 01/13/2021 - Review Complete 01/13/2021  Allergen Reaction Noted  . Azithromycin Hives 11/08/2018  . Amoxicillin  06/16/2020  . Clindamycin/lincomycin Diarrhea 02/01/2019  . Doxycycline Hives and Diarrhea 02/01/2019  . Keflex [cephalexin] Swelling 01/10/2019    ROS:  General: Negative for anorexia, weight loss, fever, chills, fatigue, weakness. ENT: Negative for hoarseness, difficulty swallowing , nasal congestion. CV: Negative for chest pain, angina, palpitations, dyspnea on exertion, peripheral edema.  Respiratory: Negative for dyspnea at rest, dyspnea on exertion, cough, sputum, wheezing.  GI: See history of present illness. GU:  Negative for dysuria, hematuria, urinary  incontinence, urinary frequency, nocturnal urination.  Endo: Negative for unusual weight change.    Physical Examination:   BP (!) 66/40   Pulse (!) 105   Temp 97.6 F (36.4 C) (Temporal)   Ht 5\' 11"  (1.803 m)   Wt 268 lb (121.6 kg)   BMI 37.38 kg/m   General: Well-nourished, well-developed in no acute distress.  Eyes: No icterus. Conjunctivae pink. Lungs: Clear to auscultation bilaterally. Non-labored. Heart: Regular rate and rhythm, no murmurs rubs or gallops.  Abdomen: Bowel sounds are normal, nontender, nondistended, no hepatosplenomegaly or masses, no abdominal bruits or hernia , no rebound or guarding.   Extremities: No lower extremity edema. No clubbing or deformities. Neuro: Alert and oriented x 3.  Grossly intact. Skin: Warm and dry, no jaundice.   Psych: Alert and cooperative, normal mood and affect.  Labs:    Imaging Studies: VAS [place] LOWER EXTREMITY VENOUS REFLUX  Result Date: 01/01/2021  Lower Venous Reflux Study Indications: Edema, and varicosities.  Performing Technologist: Concha Norway RVT  Examination Guidelines: A complete evaluation includes B-mode imaging, spectral Doppler, color Doppler, and power Doppler as needed of all accessible portions of each vessel. Bilateral testing is considered an integral part of a complete examination. Limited examinations for reoccurring indications may be performed as noted. The reflux portion of the exam is performed with the patient in reverse Trendelenburg. Significant venous reflux is defined as >500 ms in the superficial venous system, and >1 second in the deep venous system.  +----+---------+------+-----------+------------+--------+ LEFTReflux NoRefluxReflux TimeDiameter cmsComments               Yes                                  +----+---------+------+-----------+------------+--------+ CFV           yes   >1 second                      +----+---------+------+-----------+------------+--------+   Summary:  Bilateral: - No evidence of superficial venous reflux seen in the greater saphenous veins bilaterally. - No evidence of superficial venous reflux seen in the short saphenous veins bilaterally.  Right: - No evidence of deep vein thrombosis seen in the right lower extremity, from the common femoral through the popliteal veins. - No evidence of superficial venous thrombosis in the right lower extremity. - There is no evidence of venous reflux seen in the right lower extremity.  Left: - Venous reflux is noted in the left common femoral vein. - Left popliteal vein shows partial compression and chronic appearing DVT with recannalized flow throughout.  *See table(s) above for measurements and observations. Electronically signed by Leotis Pain MD on 01/01/2021 at 11:58:26 AM.    Final     Assessment and Plan:   Rich Number. is  a 60 y.o. y/o male who comes in with a long history of diarrhea who had a acute exacerbation of his Irritable bowel syndrome and may have had a transient attack of food poisoning versus something that didn't agree with him. The patient will be started on desipramine 25 mg Daily at bedtime and will have his dicyclomine and Lomotil refills. The patient has been explained the plan and agrees with it.     Lucilla Lame, MD. Marval Regal    Note: This dictation was prepared with Dragon dictation along with smaller phrase technology. Any transcriptional errors that result from this process are unintentional.

## 2021-01-14 ENCOUNTER — Telehealth: Payer: Self-pay | Admitting: Gastroenterology

## 2021-01-14 NOTE — Telephone Encounter (Signed)
Dose, Route, Frequency: As Directed  Dispense Quantity: 120 tablet Refills: 6       Sig: TAKE (1) TABLET BY MOUTH FOUR TIMES A DAY AS NEEDED FOR DIARRHEA.      Start Date: 01/13/21 End Date: --  Written Date: 01/13/21 Expiration Date: 01/13/22    Diagnosis Association: Adaptive colitis (K58.9)  Original Order:  diphenoxylate-atropine (LOMOTIL) 2.5-0.025 MG tablet [[id number]]   Providers  Ordering and Authorizing Provider:   Lucilla Lame, [address] [place], [place] [address]  Phone:  [phone]  Fax:  [phone]  DEA #:  [id number]  NPI:  [id number]     Ordering User:  Glennie [place], [place], [place]  [place], [place] [address]  Phone:  [phone] Fax:  [phone]  DEA #:  --  DAW Reason: --   Patient called and said this RX needs a new refill date please

## 2021-01-14 NOTE — Telephone Encounter (Signed)
Rx for Lomotil has been called into [facility].

## 2021-01-27 ENCOUNTER — Ambulatory Visit
Admission: RE | Admit: 2021-01-27 | Discharge: 2021-01-27 | Disposition: A | Discharge status: Home / Self Care | Payer: 59 | Source: Ambulatory Visit | Attending: Gastroenterology | Admitting: Gastroenterology

## 2021-01-27 ENCOUNTER — Other Ambulatory Visit: Payer: Self-pay

## 2021-01-27 ENCOUNTER — Telehealth: Payer: Self-pay

## 2021-01-27 DIAGNOSIS — R112 Nausea with vomiting, unspecified: Secondary | ICD-10-CM | POA: Diagnosis present

## 2021-01-27 MED ORDER — TECHNETIUM TC 99M MEBROFENIN IV KIT
5.0000 | PACK | Freq: Once | INTRAVENOUS | Status: AC | PRN
  Administered 2021-01-27: 5.071 via INTRAVENOUS

## 2021-01-27 NOTE — Telephone Encounter (Signed)
-----   Message from Lucilla Lame, MD sent at 01/27/2021  1:08 PM EDT ----- Let the patient know that his gallbladder function has improved greatly since his last emptying study with normal being over 33% and his was 60% meaning that it is unlikely that his gallbladder is causing him any problems.

## 2021-01-27 NOTE — Telephone Encounter (Signed)
Pt notified of results through my chart

## 2021-05-17 ENCOUNTER — Encounter: Payer: Self-pay | Admitting: Internal Medicine

## 2021-05-17 ENCOUNTER — Other Ambulatory Visit: Payer: Self-pay

## 2021-05-17 ENCOUNTER — Ambulatory Visit (INDEPENDENT_AMBULATORY_CARE_PROVIDER_SITE_OTHER): Payer: 59 | Admitting: Internal Medicine

## 2021-05-17 VITALS — BP 122/70 | HR 86 | Temp 98.4°F | Ht 71.0 in | Wt 247.0 lb

## 2021-05-17 DIAGNOSIS — F321 Major depressive disorder, single episode, moderate: Secondary | ICD-10-CM | POA: Diagnosis not present

## 2021-05-17 DIAGNOSIS — B029 Zoster without complications: Secondary | ICD-10-CM

## 2021-05-17 MED ORDER — HYDROCODONE-ACETAMINOPHEN 5-325 MG PO TABS: 1.0000 | ORAL_TABLET | Freq: Four times a day (QID) | ORAL | 0 refills | Status: AC | PRN

## 2021-05-17 MED ORDER — VALACYCLOVIR HCL 1 G PO TABS: 1000.0000 mg | ORAL_TABLET | Freq: Three times a day (TID) | ORAL | 0 refills | Status: AC

## 2021-05-17 NOTE — Progress Notes (Signed)
Date:  05/17/2021   Name:  Derrick Galvan.   DOB:  10-05-1961   MRN:  [id number]   Chief Complaint: Rash (Started having shooting pains in head down into neck on July 4th weekend. Painful, has blisters. Taking Ibuprofen 600 mg. Nothing topically. )  Rash This is a new problem. The current episode started in the past 7 days. The problem is unchanged. The affected locations include the scalp, neck and chest. The rash is characterized by redness, blistering and burning. He was exposed to nothing. Associated symptoms include a fever. Pertinent negatives include no cough, fatigue or shortness of breath. Treatments tried: advil. The treatment provided mild relief.   Lab Results  Component Value Date   CREATININE 1.19 09/02/2020   BUN 20 09/02/2020   NA 140 09/02/2020   K 4.6 09/02/2020   CL 104 09/02/2020   CO2 22 09/02/2020   Lab Results  Component Value Date   CHOL 184 09/02/2020   HDL 42 09/02/2020   LDLCALC 126 (H) 09/02/2020   TRIG 86 09/02/2020   CHOLHDL 4.4 09/02/2020   No results found for: TSH No results found for: HGBA1C Lab Results  Component Value Date   WBC 7.8 09/02/2020   HGB 17.2 09/02/2020   HCT 50.2 09/02/2020   MCV 91 09/02/2020   PLT 173 09/02/2020   Lab Results  Component Value Date   ALT 16 09/02/2020   AST 18 09/02/2020   ALKPHOS 85 09/02/2020   BILITOT 1.4 (H) 09/02/2020     Review of Systems  Constitutional:  Positive for chills and fever. Negative for fatigue.  HENT:  Negative for trouble swallowing.   Eyes:  Negative for discharge, redness, itching and visual disturbance.  Respiratory:  Negative for cough, chest tightness and shortness of breath.   Cardiovascular:  Negative for chest pain.  Skin:  Positive for rash.  Neurological:  Positive for headaches.   Patient Active Problem List   Diagnosis Date Noted   Gastroesophageal reflux disease without esophagitis    Personal history of colonic polyps    Polyp of ascending colon     Current moderate episode of major depressive disorder without prior episode ([place]) 09/02/2020   Varicose veins of right lower extremity with complications 11/15/3233   Achilles tendinitis 06/16/2020   Generalized anxiety disorder 08/22/2019   Gastroesophageal reflux disease with esophagitis 08/22/2019   SCCA (squamous cell carcinoma) of skin 02/01/2019   DVT (deep venous thrombosis) ([place]) 01/21/2019   Sleep disturbance 12/18/2018   Benign neoplasm of ascending colon    Hepatitis B 07/09/2015   Adaptive colitis 09/19/2013   Chronic diarrhea 07/31/2013    Allergies  Allergen Reactions   Azithromycin Hives   Amoxicillin    Clindamycin/Lincomycin Diarrhea   Doxycycline Hives and Diarrhea   Keflex [Cephalexin] Swelling    Past Surgical History:  Procedure Laterality Date   COLONOSCOPY  02/15/11   repeat 10 years   COLONOSCOPY WITH PROPOFOL N/A 08/15/2016   Procedure: COLONOSCOPY WITH PROPOFOL;  Surgeon: Lucilla Lame, MD;  Location: [facility];  Service: Endoscopy;  Laterality: N/A;   COLONOSCOPY WITH PROPOFOL N/A 09/14/2020   Procedure: COLONOSCOPY WITH BIOPSY;  Surgeon: Lucilla Lame, MD;  Location: [facility];  Service: Endoscopy;  Laterality: N/A;   ESOPHAGOGASTRODUODENOSCOPY (EGD) WITH PROPOFOL N/A 08/15/2016   Procedure: ESOPHAGOGASTRODUODENOSCOPY (EGD) WITH PROPOFOL;  Surgeon: Lucilla Lame, MD;  Location: [facility];  Service: Endoscopy;  Laterality: N/A;   ESOPHAGOGASTRODUODENOSCOPY (EGD) WITH PROPOFOL N/A  09/14/2020   Procedure: ESOPHAGOGASTRODUODENOSCOPY (EGD) WITH PROPOFOL;  Surgeon: Lucilla Lame, MD;  Location: [facility];  Service: Endoscopy;  Laterality: N/A;   POLYPECTOMY  08/15/2016   Procedure: POLYPECTOMY;  Surgeon: Lucilla Lame, MD;  Location: [facility];  Service: Endoscopy;;   POLYPECTOMY N/A 09/14/2020   Procedure: POLYPECTOMY;  Surgeon: Lucilla Lame, MD;  Location: [facility];  Service: Endoscopy;  Laterality: N/A;     Social History   Tobacco Use   Smoking status: Never   Smokeless tobacco: Never  Vaping Use   Vaping Use: Never used  Substance Use Topics   Alcohol use: Yes    Alcohol/week: 0.0 standard drinks    Comment: occasional   Drug use: No     Medication list has been reviewed and updated.  Current Meds  Medication Sig   acetaminophen (TYLENOL) 500 MG tablet Take 1,000 mg by mouth every 6 (six) hours as needed.   bismuth subsalicylate (PEPTO BISMOL) 262 MG/15ML suspension Take 30 mLs by mouth every 6 (six) hours as needed. OTC   desipramine (NORPRAMIN) 25 MG tablet Take 1 tablet (25 mg total) by mouth at bedtime. (Patient taking differently: Take 25 mg by mouth at bedtime.)   dicyclomine (BENTYL) 20 MG tablet TAKE (1) TABLET BY MOUTH THREE TIMES A DAY BEFORE MEALS.   diphenoxylate-atropine (LOMOTIL) 2.5-0.025 MG tablet TAKE (1) TABLET BY MOUTH FOUR TIMES A DAY AS NEEDED FOR DIARRHEA.   Efinaconazole (JUBLIA) 10 % SOLN Apply topically.   famotidine (PEPCID) 10 MG tablet Take 10 mg by mouth 2 (two) times daily. OTC   ibuprofen (ADVIL) 600 MG tablet Take 1 tablet (600 mg total) by mouth every 8 (eight) hours as needed.   Loperamide HCl (IMODIUM PO) Take by mouth as needed.   meloxicam (MOBIC) 15 MG tablet as needed.   omeprazole (PRILOSEC) 20 MG capsule Take 20 mg by mouth every other day.   triamcinolone cream (KENALOG) 0.1 % Apply 1 application topically as needed. HIVES from Azithromy reaction.    PHQ 2/9 Scores 05/17/2021 09/02/2020 02/25/2020 08/22/2019  PHQ - 2 Score 3 4 1  0  PHQ- 9 Score 9 10 1 5     GAD 7 : Generalized Anxiety Score 05/17/2021 09/02/2020 02/25/2020 08/22/2019  Nervous, Anxious, on Edge 2 2 3 1   Control/stop worrying 3 2 1 1   Worry too much - different things 3 2 1 1   Trouble relaxing 2 0 2 0  Restless 0 0 0 0  Easily annoyed or irritable 2 2 3 1   Afraid - awful might happen 3 2 1 1   Total GAD 7 Score 15 10 11 5   Anxiety Difficulty Not difficult at all  Not difficult at all Somewhat difficult Not difficult at all    BP Readings from Last 3 Encounters:  05/17/21 122/70  01/13/21 (!) 66/40  12/30/20 134/84    Physical Exam Vitals and nursing note reviewed.  Constitutional:      General: He is not in acute distress.    Appearance: He is well-developed.  HENT:     Head: Normocephalic and atraumatic.  Pulmonary:     Effort: Pulmonary effort is normal. No respiratory distress.  Skin:    General: Skin is warm and dry.     Findings: Rash present.          Comments: Fluid filled vesicles, red skin - no drainage or purulence No facial or nasal lesions  Neurological:     Mental Status: He is alert  and oriented to person, place, and time.  Psychiatric:        Mood and Affect: Mood normal.        Behavior: Behavior normal.    Wt Readings from Last 3 Encounters:  05/17/21 247 lb (112 kg)  01/13/21 268 lb (121.6 kg)  12/30/20 265 lb (120.2 kg)    BP 122/70   Pulse 86   Temp 98.4 F (36.9 C) (Oral)   Ht 5\' 11"  (1.803 m)   Wt 247 lb (112 kg)   SpO2 97%   BMI 34.45 kg/m   Assessment and Plan: 1. Zoster without complications Continue Advil for fever and headache Local care with warm water only Take hydrocodone at night - but not when driving or working - valACYclovir (VALTREX) 1000 MG tablet; Take 1 tablet (1,000 mg total) by mouth 3 (three) times daily for 10 days.  Dispense: 30 tablet; Refill: 0 - HYDROcodone-acetaminophen (NORCO/VICODIN) 5-325 MG tablet; Take 1 tablet by mouth every 6 (six) hours as needed for up to 5 days for moderate pain.  Dispense: 20 tablet; Refill: 0  2. Current moderate episode of major depressive disorder without prior episode ([place]) Has xanax to use PRN Was able to wean off of Lexapro last year   Partially dictated using Editor, commissioning. Any errors are unintentional.  Halina Maidens, MD [facility]  05/17/2021

## 2021-05-18 ENCOUNTER — Telehealth: Payer: Self-pay | Admitting: Internal Medicine

## 2021-05-18 NOTE — Telephone Encounter (Signed)
Pt seen dr berglund yesterday for shingles. Pt is calling to let md know he is using cool down product made by absorbine that is use for horses it is herbal work out rinse he is apply product using cotton ball and putting on the rash and has experienced relief. Please advise

## 2021-05-18 NOTE — Telephone Encounter (Signed)
FYI  KP

## 2021-05-19 ENCOUNTER — Other Ambulatory Visit: Payer: Self-pay | Admitting: Internal Medicine

## 2021-05-19 DIAGNOSIS — G8929 Other chronic pain: Secondary | ICD-10-CM

## 2021-05-19 DIAGNOSIS — M25572 Pain in left ankle and joints of left foot: Secondary | ICD-10-CM

## 2021-05-19 DIAGNOSIS — B029 Zoster without complications: Secondary | ICD-10-CM

## 2021-05-19 DIAGNOSIS — M19041 Primary osteoarthritis, right hand: Secondary | ICD-10-CM

## 2021-05-19 MED ORDER — IBUPROFEN 600 MG PO TABS: 600.0000 mg | ORAL_TABLET | Freq: Three times a day (TID) | ORAL | 5 refills | Status: DC | PRN

## 2021-05-19 MED ORDER — PREGABALIN 50 MG PO CAPS: 50.0000 mg | ORAL_CAPSULE | Freq: Two times a day (BID) | ORAL | 0 refills | Status: DC

## 2021-05-19 NOTE — Telephone Encounter (Signed)
Patient informed. 

## 2021-05-19 NOTE — Telephone Encounter (Signed)
Copied from [place] [id number]. Topic: Quick Communication - Rx Refill/Question >> May 19, 2021  8:06 AM Leward Quan A wrote: Medication: ibuprofen (ADVIL) 600 MG tablet  Did the patient contacted their pharmacy? Yes.   (Agent: If no, request that the patient contact the pharmacy for the refill.) (Agent: If yes, when and what did the pharmacy advise?)  Preferred Pharmacy (with phone number or street name): [place], [place] [facility] [address]  Phone:  [phone] Fax:  [phone]     Agent: Please be advised that RX refills may take up to 3 business days. We ask that you follow-up with your pharmacy.

## 2021-05-19 NOTE — Telephone Encounter (Signed)
Patient asking for a call back from Dr Gaspar Cola nurse please to discuss medication suggested

## 2021-05-19 NOTE — Telephone Encounter (Signed)
   Notes to clinic:  Script requested has expired  Review for continued use and refill    Requested Prescriptions  Pending Prescriptions Disp Refills   ibuprofen (ADVIL) 600 MG tablet 100 tablet 5    Sig: Take 1 tablet (600 mg total) by mouth every 8 (eight) hours as needed.      Analgesics:  NSAIDS Passed - 05/19/2021  8:12 AM      Passed - Cr in normal range and within 360 days    Creatinine, Ser  Date Value Ref Range Status  09/02/2020 1.19 0.76 - 1.27 mg/dL Final          Passed - HGB in normal range and within 360 days    Hemoglobin  Date Value Ref Range Status  09/02/2020 17.2 13.0 - 17.7 g/dL Final          Passed - Patient is not pregnant      Passed - Valid encounter within last 12 months    Recent Outpatient Visits           2 days ago Zoster without complications   [facility] Glean Hess, MD   8 months ago Annual physical exam   [facility] Glean Hess, MD   1 year ago Deep vein thrombosis (DVT) of popliteal vein of left lower extremity, unspecified chronicity [facility])   [facility] Glean Hess, MD   1 year ago Annual physical exam   [facility] Glean Hess, MD   1 year ago Generalized anxiety disorder   [facility] Glean Hess, MD

## 2021-05-19 NOTE — Telephone Encounter (Signed)
Patient called in to inform dr berglund that the pain medication prescribed is not working so asking for something else please Ph# [phone]

## 2021-05-19 NOTE — Telephone Encounter (Signed)
Patient would like PCP to prescribed lyrica today, and would like a follow up call when completed  best # [place], [place] Phone:  [phone]  Fax:  [phone]

## 2021-05-24 ENCOUNTER — Telehealth: Payer: Self-pay

## 2021-05-24 NOTE — Telephone Encounter (Signed)
Copied from [place] [phone]. Topic: General - Other >> May 24, 2021  2:42 PM Pawlus, Brayton Layman A wrote: Reason for CRM: Pt requested a call back from [place] to go over some questions regarding his shingles vaccine.

## 2021-05-25 NOTE — Telephone Encounter (Signed)
Called patient and left VM asking him to call dermatology to see if he can get an appointment in the next few days. If he cannot get in with them, told him to call our office back to see Dr Army Melia again.

## 2021-06-23 ENCOUNTER — Ambulatory Visit (INDEPENDENT_AMBULATORY_CARE_PROVIDER_SITE_OTHER): Payer: 59 | Admitting: Nurse Practitioner

## 2021-08-10 ENCOUNTER — Other Ambulatory Visit: Payer: Self-pay | Admitting: Gastroenterology

## 2021-08-10 DIAGNOSIS — K589 Irritable bowel syndrome without diarrhea: Secondary | ICD-10-CM

## 2021-08-24 ENCOUNTER — Ambulatory Visit (INDEPENDENT_AMBULATORY_CARE_PROVIDER_SITE_OTHER): Payer: 59 | Admitting: Vascular Surgery

## 2021-08-24 ENCOUNTER — Other Ambulatory Visit: Payer: Self-pay

## 2021-08-24 ENCOUNTER — Encounter (INDEPENDENT_AMBULATORY_CARE_PROVIDER_SITE_OTHER): Payer: Self-pay | Admitting: Vascular Surgery

## 2021-08-24 DIAGNOSIS — I87009 Postthrombotic syndrome without complications of unspecified extremity: Secondary | ICD-10-CM | POA: Insufficient documentation

## 2021-08-24 DIAGNOSIS — I890 Lymphedema, not elsewhere classified: Secondary | ICD-10-CM

## 2021-08-24 NOTE — Progress Notes (Signed)
MRN : [id number]  Derrick Galvan. is a 60 y.o. (16-Mar-1961) male who presents with chief complaint of  Chief Complaint  Patient presents with   Follow-up    6 Mo no studies   .  History of Present Illness: Patient returns today in follow up of his left leg swelling and lymphedema.  Unfortunately, he did not get his lymphedema pump that we had requested earlier this year.  His swelling really has not gotten much better.  He wears compression stockings daily.  He elevates his leg.  This is almost all in his left leg.  No new ulceration or infection.   Current Outpatient Medications  Medication Sig Dispense Refill   acetaminophen (TYLENOL) 500 MG tablet Take 1,000 mg by mouth every 6 (six) hours as needed.     bismuth subsalicylate (PEPTO BISMOL) 262 MG/15ML suspension Take 30 mLs by mouth every 6 (six) hours as needed. OTC     desipramine (NORPRAMIN) 25 MG tablet Take 1 tablet (25 mg total) by mouth at bedtime. (Patient taking differently: Take 25 mg by mouth at bedtime.) 30 tablet 3   dicyclomine (BENTYL) 20 MG tablet TAKE (1) TABLET BY MOUTH THREE TIMES A DAY BEFORE MEALS. 90 tablet 3   diphenoxylate-atropine (LOMOTIL) 2.5-0.025 MG tablet TAKE (1) TABLET BY MOUTH FOUR TIMES A DAY AS NEEDED FOR DIARRHEA 120 tablet 5   Efinaconazole (JUBLIA) 10 % SOLN Apply topically.     famotidine (PEPCID) 10 MG tablet Take 10 mg by mouth 2 (two) times daily. OTC     HYDROcodone-acetaminophen (NORCO/VICODIN) 5-325 MG tablet hydrocodone 5 mg-acetaminophen 325 mg tablet     ibuprofen (ADVIL) 600 MG tablet Take 1 tablet (600 mg total) by mouth every 8 (eight) hours as needed. 100 tablet 5   Loperamide HCl (IMODIUM PO) Take by mouth as needed.     meloxicam (MOBIC) 15 MG tablet as needed.     omeprazole (PRILOSEC) 20 MG capsule Take 20 mg by mouth every other day.     pregabalin (LYRICA) 50 MG capsule Take 1 capsule (50 mg total) by mouth 2 (two) times daily. 60 capsule 0   triamcinolone cream (KENALOG)  0.1 % Apply 1 application topically as needed. HIVES from Azithromy reaction.     No current facility-administered medications for this visit.    Past Medical History:  Diagnosis Date   Acute gastritis without hemorrhage    Adaptive colitis 09/19/2013   Arthritis    knees   Chronic diarrhea 07/31/2013   DVT (deep venous thrombosis) ([place]) 12/21/2018   LLL   GERD (gastroesophageal reflux disease)    Hepatitis B 1989   resolved   Skin cancer    on face - removed under local    Past Surgical History:  Procedure Laterality Date   COLONOSCOPY  02/15/11   repeat 10 years   COLONOSCOPY WITH PROPOFOL N/A 08/15/2016   Procedure: COLONOSCOPY WITH PROPOFOL;  Surgeon: Lucilla Lame, MD;  Location: [place];  Service: Endoscopy;  Laterality: N/A;   COLONOSCOPY WITH PROPOFOL N/A 09/14/2020   Procedure: COLONOSCOPY WITH BIOPSY;  Surgeon: Lucilla Lame, MD;  Location: [place];  Service: Endoscopy;  Laterality: N/A;   ESOPHAGOGASTRODUODENOSCOPY (EGD) WITH PROPOFOL N/A 08/15/2016   Procedure: ESOPHAGOGASTRODUODENOSCOPY (EGD) WITH PROPOFOL;  Surgeon: Lucilla Lame, MD;  Location: [place];  Service: Endoscopy;  Laterality: N/A;   ESOPHAGOGASTRODUODENOSCOPY (EGD) WITH PROPOFOL N/A 09/14/2020   Procedure: ESOPHAGOGASTRODUODENOSCOPY (EGD) WITH PROPOFOL;  Surgeon: Lucilla Lame, MD;  Location: [place];  Service: Endoscopy;  Laterality: N/A;   POLYPECTOMY  08/15/2016   Procedure: POLYPECTOMY;  Surgeon: Lucilla Lame, MD;  Location: [place];  Service: Endoscopy;;   POLYPECTOMY N/A 09/14/2020   Procedure: POLYPECTOMY;  Surgeon: Lucilla Lame, MD;  Location: [place];  Service: Endoscopy;  Laterality: N/A;     Social History   Tobacco Use   Smoking status: Never   Smokeless tobacco: Never  Vaping Use   Vaping Use: Never used  Substance Use Topics   Alcohol use: Yes    Alcohol/week: 0.0 standard drinks    Comment: occasional   Drug use: No        Family History  Problem Relation Age of Onset   Healthy Mother    COPD Father      Allergies  Allergen Reactions   Azithromycin Hives   Amoxicillin    Clindamycin/Lincomycin Diarrhea   Doxycycline Hives and Diarrhea   Keflex [Cephalexin] Swelling     REVIEW OF SYSTEMS (Negative unless checked)  Constitutional: [] Weight loss  [] Fever  [] Chills Cardiac: [] Chest pain   [] Chest pressure   [] Palpitations   [] Shortness of breath when laying flat   [] Shortness of breath at rest   [] Shortness of breath with exertion. Vascular:  [] Pain in legs with walking   [] Pain in legs at rest   [] Pain in legs when laying flat   [] Claudication   [] Pain in feet when walking  [] Pain in feet at rest  [] Pain in feet when laying flat   [] History of DVT   [] Phlebitis   [x] Swelling in legs   [] Varicose veins   [] Non-healing ulcers Pulmonary:   [] Uses home oxygen   [] Productive cough   [] Hemoptysis   [] Wheeze  [] COPD   [] Asthma Neurologic:  [] Dizziness  [] Blackouts   [] Seizures   [] History of stroke   [] History of TIA  [] Aphasia   [] Temporary blindness   [] Dysphagia   [] Weakness or numbness in arms   [] Weakness or numbness in legs Musculoskeletal:  [x] Arthritis   [] Joint swelling   [] Joint pain   [] Low back pain Hematologic:  [] Easy bruising  [] Easy bleeding   [] Hypercoagulable state   [] Anemic   Gastrointestinal:  [] Blood in stool   [] Vomiting blood  [x] Gastroesophageal reflux/heartburn   [] Abdominal pain Genitourinary:  [] Chronic kidney disease   [] Difficult urination  [] Frequent urination  [] Burning with urination   [] Hematuria Skin:  [] Rashes   [] Ulcers   [] Wounds Psychological:  [] History of anxiety   []  History of major depression.  Physical Examination  BP (!) 149/80   Pulse 66   Ht 5\' 11"  (1.803 m)   Wt 254 lb (115.2 kg)   BMI 35.43 kg/m  Gen:  WD/WN, NAD Head: [place]/AT, No temporalis wasting. Ear/Nose/Throat: Hearing grossly intact, nares w/o erythema or drainage Eyes: Conjunctiva clear.  Sclera non-icteric Neck: Supple.  Trachea midline Pulmonary:  Good air movement, no use of accessory muscles.  Cardiac: RRR, no JVD Vascular:  Vessel Right Left  Radial Palpable Palpable           Musculoskeletal: M/S 5/5 throughout.  No deformity or atrophy. 1-2+ LLE edema. Neurologic: Sensation grossly intact in extremities.  Symmetrical.  Speech is fluent.  Psychiatric: Judgment intact, Mood & affect appropriate for pt's clinical situation. Dermatologic: No rashes or ulcers noted.  No cellulitis or open wounds.      Labs No results found for this or any previous visit (from the past 2160 hour(s)).  Radiology No results  found.  Assessment/Plan  Lymphedema The patient is developed stage II lymphedema as result of scarring and lymphatic channels with swelling refractory to compression and elevation.  This is in conjunction with the postphlebitic syndrome.  At this point, a lymphedema pump would be an excellent adjuvant therapy and I believe he should begin using that once or twice daily for 30 to 45 minutes each session.  I discussed the pathophysiology and natural history of lymphedema.  I discussed the reason and rationale for treatment. I will see him back in six months.  Post-phlebitic syndrome The patient has postphlebitic changes to the left leg as well as lymphedema.  Compression, elevation, and the lymphedema pump will be mainstays of treatment.  Recheck in 6 months    Leotis Pain, MD  08/24/2021 12:32 PM    This note was created with Dragon medical transcription system.  Any errors from dictation are purely unintentional

## 2021-08-24 NOTE — Assessment & Plan Note (Signed)
The patient has postphlebitic changes to the left leg as well as lymphedema.  Compression, elevation, and the lymphedema pump will be mainstays of treatment.  Recheck in 6 months

## 2021-08-24 NOTE — Assessment & Plan Note (Signed)
The patient is developed stage II lymphedema as result of scarring and lymphatic channels with swelling refractory to compression and elevation.  This is in conjunction with the postphlebitic syndrome.  At this point, a lymphedema pump would be an excellent adjuvant therapy and I believe he should begin using that once or twice daily for 30 to 45 minutes each session.  I discussed the pathophysiology and natural history of lymphedema.  I discussed the reason and rationale for treatment. I will see him back in six months.

## 2021-11-15 ENCOUNTER — Ambulatory Visit: Payer: Self-pay

## 2021-11-15 NOTE — Telephone Encounter (Signed)
°  Chief Complaint: Chest pain Symptoms: Pain right side of chest. Comes and goes. Pain 4/10. No pain today. "It might be anxiety." History of DVT. Frequency: Started 2 days ago Pertinent Negatives: Patient denies pain now. Disposition: [] ED /[] Urgent Care (no appt availability in office) / [x] Appointment(In office/virtual)/ []  [facility]/ [] Home Care/ [] Refused Recommended Disposition /[] [facility]/ []  Follow-up with PCP Additional Notes: Instructed to go to ED for worsening of symptoms.   Reason for Disposition  [1] Chest pain lasts < 5 minutes AND [2] NO chest pain or cardiac symptoms (e.g., breathing difficulty, sweating) now (Exception: chest pains that last only a few seconds)  Answer Assessment - Initial Assessment Questions 1. LOCATION: "Where does it hurt?"       On right 2. RADIATION: "Does the pain go anywhere else?" (e.g., into neck, jaw, arms, back)     No 3. ONSET: "When did the chest pain begin?" (Minutes, hours or days)      Minutes 4. PATTERN "Does the pain come and go, or has it been constant since it started?"  "Does it get worse with exertion?"      Comes and goes 5. DURATION: "How long does it last" (e.g., seconds, minutes, hours)     Minutes 6. SEVERITY: "How bad is the pain?"  (e.g., Scale 1-10; mild, moderate, or severe)    - MILD (1-3): doesn't interfere with normal activities     - MODERATE (4-7): interferes with normal activities or awakens from sleep    - SEVERE (8-10): excruciating pain, unable to do any normal activities       4 7. CARDIAC RISK FACTORS: "Do you have any history of heart problems or risk factors for heart disease?" (e.g., angina, prior heart attack; diabetes, high blood pressure, high cholesterol, smoker, or strong family history of heart disease)     No 8. PULMONARY RISK FACTORS: "Do you have any history of lung disease?"  (e.g., blood clots in lung, asthma, emphysema, birth control pills)     No 9. CAUSE: "What do  you think is causing the chest pain?"     DVT 10. OTHER SYMPTOMS: "Do you have any other symptoms?" (e.g., dizziness, nausea, vomiting, sweating, fever, difficulty breathing, cough)       nOT well 11. PREGNANCY: "Is there any chance you are pregnant?" "When was your last menstrual period?"       N/A  Protocols used: Chest Pain-A-AH

## 2021-11-16 ENCOUNTER — Encounter: Payer: Self-pay | Admitting: Gastroenterology

## 2021-11-16 ENCOUNTER — Encounter: Payer: Self-pay | Admitting: Internal Medicine

## 2021-11-16 ENCOUNTER — Other Ambulatory Visit: Payer: Self-pay

## 2021-11-16 ENCOUNTER — Ambulatory Visit (INDEPENDENT_AMBULATORY_CARE_PROVIDER_SITE_OTHER): Payer: 59 | Admitting: Internal Medicine

## 2021-11-16 VITALS — BP 136/78 | HR 80 | Resp 14 | Ht 71.0 in | Wt 255.4 lb

## 2021-11-16 DIAGNOSIS — F321 Major depressive disorder, single episode, moderate: Secondary | ICD-10-CM | POA: Diagnosis not present

## 2021-11-16 DIAGNOSIS — I447 Left bundle-branch block, unspecified: Secondary | ICD-10-CM

## 2021-11-16 DIAGNOSIS — I8002 Phlebitis and thrombophlebitis of superficial vessels of left lower extremity: Secondary | ICD-10-CM

## 2021-11-16 DIAGNOSIS — R0789 Other chest pain: Secondary | ICD-10-CM | POA: Diagnosis not present

## 2021-11-16 MED ORDER — VORTIOXETINE HBR 10 MG PO TABS: 10.0000 mg | ORAL_TABLET | Freq: Every day | ORAL | 5 refills | Status: DC

## 2021-11-16 NOTE — Progress Notes (Signed)
Date:  11/16/2021   Name:  Derrick Galvan.   DOB:  04-02-1961   MRN:  [id number]   Derrick Complaint: Chest Pain  Chest Pain  This is a new problem. The current episode started in the past 7 days. The onset quality is gradual. The problem occurs 2 to 4 times per day (lasting several hours at the worst; other times only a few minutes). The problem has been waxing and waning. The pain is moderate. The quality of the pain is described as tightness. The pain does not radiate. Associated symptoms include leg pain. Pertinent negatives include no abdominal pain, back pain, cough, dizziness, fever, headaches, irregular heartbeat, palpitations or shortness of breath.  Leg Pain  The incident occurred 2 days ago. There was no injury mechanism. The pain is present in the left leg. The pain is moderate. The pain has been Constant since onset.  Depression        This is a chronic problem.  The problem occurs daily.  The problem has been gradually worsening since onset.  Associated symptoms include fatigue, insomnia, restlessness and decreased interest.  Associated symptoms include no headaches and no suicidal ideas.  Past treatments include SSRIs - Selective serotonin reuptake inhibitors (lexapro no benefit).  Lab Results  Component Value Date   NA 140 09/02/2020   K 4.6 09/02/2020   CO2 22 09/02/2020   GLUCOSE 101 (H) 09/02/2020   BUN 20 09/02/2020   CREATININE 1.19 09/02/2020   CALCIUM 9.4 09/02/2020   GFRNONAA 67 09/02/2020   Lab Results  Component Value Date   CHOL 184 09/02/2020   HDL 42 09/02/2020   LDLCALC 126 (H) 09/02/2020   TRIG 86 09/02/2020   CHOLHDL 4.4 09/02/2020   No results found for: TSH No results found for: HGBA1C Lab Results  Component Value Date   WBC 7.8 09/02/2020   HGB 17.2 09/02/2020   HCT 50.2 09/02/2020   MCV 91 09/02/2020   PLT 173 09/02/2020   Lab Results  Component Value Date   ALT 16 09/02/2020   AST 18 09/02/2020   ALKPHOS 85 09/02/2020   BILITOT  1.4 (H) 09/02/2020   No results found for: 25OHVITD2, 25OHVITD3, VD25OH   Review of Systems  Constitutional:  Positive for fatigue. Negative for chills, fever and unexpected weight change.  Respiratory:  Negative for cough, chest tightness, shortness of breath and wheezing.   Cardiovascular:  Positive for chest pain and leg swelling (chronic). Negative for palpitations.  Gastrointestinal:  Negative for abdominal pain.  Musculoskeletal:  Negative for back pain.  Neurological:  Negative for dizziness, light-headedness and headaches.  Psychiatric/Behavioral:  Positive for depression, dysphoric mood and sleep disturbance. Negative for suicidal ideas. The patient is nervous/anxious and has insomnia.    Patient Active Problem List   Diagnosis Date Noted   Lymphedema 08/24/2021   Post-phlebitic syndrome 08/24/2021   Gastroesophageal reflux disease without esophagitis    Personal history of colonic polyps    Current moderate episode of major depressive disorder without prior episode ([place]) 09/02/2020   Varicose veins of right lower extremity with complications 02/77/4128   Generalized anxiety disorder 08/22/2019   SCCA (squamous cell carcinoma) of skin 02/01/2019   DVT (deep venous thrombosis) ([place]) 01/21/2019   Sleep disturbance 12/18/2018   Benign neoplasm of ascending colon    Adaptive colitis 09/19/2013    Allergies  Allergen Reactions   Azithromycin Hives   Amoxicillin    Clindamycin/Lincomycin Diarrhea   Doxycycline Hives and  Diarrhea   Keflex [Cephalexin] Swelling    Past Surgical History:  Procedure Laterality Date   COLONOSCOPY  02/15/11   repeat 10 years   COLONOSCOPY WITH PROPOFOL N/A 08/15/2016   Procedure: COLONOSCOPY WITH PROPOFOL;  Surgeon: Lucilla Lame, MD;  Location: [place];  Service: Endoscopy;  Laterality: N/A;   COLONOSCOPY WITH PROPOFOL N/A 09/14/2020   Procedure: COLONOSCOPY WITH BIOPSY;  Surgeon: Lucilla Lame, MD;  Location: [place];   Service: Endoscopy;  Laterality: N/A;   ESOPHAGOGASTRODUODENOSCOPY (EGD) WITH PROPOFOL N/A 08/15/2016   Procedure: ESOPHAGOGASTRODUODENOSCOPY (EGD) WITH PROPOFOL;  Surgeon: Lucilla Lame, MD;  Location: [place];  Service: Endoscopy;  Laterality: N/A;   ESOPHAGOGASTRODUODENOSCOPY (EGD) WITH PROPOFOL N/A 09/14/2020   Procedure: ESOPHAGOGASTRODUODENOSCOPY (EGD) WITH PROPOFOL;  Surgeon: Lucilla Lame, MD;  Location: [place];  Service: Endoscopy;  Laterality: N/A;   POLYPECTOMY  08/15/2016   Procedure: POLYPECTOMY;  Surgeon: Lucilla Lame, MD;  Location: [place];  Service: Endoscopy;;   POLYPECTOMY N/A 09/14/2020   Procedure: POLYPECTOMY;  Surgeon: Lucilla Lame, MD;  Location: [place];  Service: Endoscopy;  Laterality: N/A;    Social History   Tobacco Use   Smoking status: Never   Smokeless tobacco: Never  Vaping Use   Vaping Use: Never used  Substance Use Topics   Alcohol use: Yes    Alcohol/week: 0.0 standard drinks    Comment: occasional   Drug use: No     Medication list has been reviewed and updated.  Current Meds  Medication Sig   acetaminophen (TYLENOL) 500 MG tablet Take 1,000 mg by mouth every 6 (six) hours as needed.   bismuth subsalicylate (PEPTO BISMOL) 262 MG/15ML suspension Take 30 mLs by mouth every 6 (six) hours as needed. OTC   desipramine (NORPRAMIN) 25 MG tablet Take 1 tablet (25 mg total) by mouth at bedtime. (Patient taking differently: Take 25 mg by mouth at bedtime.)   dicyclomine (BENTYL) 20 MG tablet TAKE (1) TABLET BY MOUTH THREE TIMES A DAY BEFORE MEALS.   diphenoxylate-atropine (LOMOTIL) 2.5-0.025 MG tablet TAKE (1) TABLET BY MOUTH FOUR TIMES A DAY AS NEEDED FOR DIARRHEA   Efinaconazole (JUBLIA) 10 % SOLN Apply topically.   famotidine (PEPCID) 10 MG tablet Take 10 mg by mouth 2 (two) times daily. OTC   HYDROcodone-acetaminophen (NORCO/VICODIN) 5-325 MG tablet hydrocodone 5 mg-acetaminophen 325 mg tablet   ibuprofen  (ADVIL) 600 MG tablet Take 1 tablet (600 mg total) by mouth every 8 (eight) hours as needed.   Loperamide HCl (IMODIUM PO) Take by mouth as needed.   meloxicam (MOBIC) 15 MG tablet as needed.   omeprazole (PRILOSEC) 20 MG capsule Take 20 mg by mouth every other day.   pregabalin (LYRICA) 50 MG capsule Take 1 capsule (50 mg total) by mouth 2 (two) times daily.   triamcinolone cream (KENALOG) 0.1 % Apply 1 application topically as needed. HIVES from Azithromy reaction.    PHQ 2/9 Scores 11/16/2021 05/17/2021 09/02/2020 02/25/2020  PHQ - 2 Score 4 3 4 1   PHQ- 9 Score 14 9 10 1     GAD 7 : Generalized Anxiety Score 11/16/2021 05/17/2021 09/02/2020 02/25/2020  Nervous, Anxious, on Edge 3 2 2 3   Control/stop worrying 3 3 2 1   Worry too much - different things 3 3 2 1   Trouble relaxing 3 2 0 2  Restless 1 0 0 0  Easily annoyed or irritable 3 2 2 3   Afraid - awful might happen 3 3 2  1  Total GAD 7 Score 19 15 10 11   Anxiety Difficulty Very difficult Not difficult at all Not difficult at all Somewhat difficult    BP Readings from Last 3 Encounters:  11/16/21 136/78  08/24/21 (!) 149/80  05/17/21 122/70    Physical Exam Vitals and nursing note reviewed.  Constitutional:      General: He is not in acute distress.    Appearance: He is well-developed.  HENT:     Head: Normocephalic and atraumatic.  Cardiovascular:     Heart sounds: Normal heart sounds.  Pulmonary:     Effort: Pulmonary effort is normal. No respiratory distress.  Chest:     Chest wall: No mass.  Musculoskeletal:     Right lower leg: Edema present.     Left lower leg: Tenderness (medial left knee) present. Edema present.       Legs:     Comments: Mildly tender cord with redness and warmth  Skin:    General: Skin is warm and dry.     Findings: No rash.  Neurological:     Mental Status: He is alert and oriented to person, place, and time.  Psychiatric:        Mood and Affect: Mood normal.        Behavior: Behavior  normal.    Wt Readings from Last 3 Encounters:  11/16/21 255 lb 6.4 oz (115.8 kg)  08/24/21 254 lb (115.2 kg)  05/17/21 247 lb (112 kg)    BP 136/78    Pulse 80    Resp 14    Ht 5\' 11"  (1.803 m)    Wt 255 lb 6.4 oz (115.8 kg)    SpO2 97%    BMI 35.62 kg/m   Assessment and Plan: 1. Other chest pain Atypical presentation without exertional component.  He recently returned from a stressful visit home when he returned. Will obtain labs and refer to cardiology. - EKG 12-Lead - LBBB, SR  no prior to compare - Ambulatory referral to Cardiology - CBC with Differential/Platelet - Comprehensive metabolic panel - Lipid panel - TSH  2. Thrombophlebitis of superficial veins of left lower extremity Recommend heat and nsaids if needed  3. Left bundle branch block (LBBB) determined by electrocardiography No prior to compare  4. Current moderate episode of major depressive disorder without prior episode ([place]) Depression and anxiety not well controlled. Failed a trial of Lexapro. Will give samples of Trintellix 5 mg x 7 days then 10 mg per day Follow up in 6 weeks.   Partially dictated using Editor, commissioning. Any errors are unintentional.  Halina Maidens, MD [facility]  11/16/2021

## 2021-11-17 ENCOUNTER — Telehealth: Payer: Self-pay

## 2021-11-17 ENCOUNTER — Other Ambulatory Visit: Payer: Self-pay

## 2021-11-17 LAB — CBC WITH DIFFERENTIAL/PLATELET
Basophils Absolute: 0.1 10*3/uL (ref 0.0–0.2)
Basos: 1 %
EOS (ABSOLUTE): 0.2 10*3/uL (ref 0.0–0.4)
Eos: 2 %
Hematocrit: 49.2 % (ref 37.5–51.0)
Hemoglobin: 16.9 g/dL (ref 13.0–17.7)
Immature Grans (Abs): 0 10*3/uL (ref 0.0–0.1)
Immature Granulocytes: 0 %
Lymphocytes Absolute: 2.5 10*3/uL (ref 0.7–3.1)
Lymphs: 33 %
MCH: 31.2 pg (ref 26.6–33.0)
MCHC: 34.3 g/dL (ref 31.5–35.7)
MCV: 91 fL (ref 79–97)
Monocytes Absolute: 0.6 10*3/uL (ref 0.1–0.9)
Monocytes: 8 %
Neutrophils Absolute: 4.2 10*3/uL (ref 1.4–7.0)
Neutrophils: 56 %
Platelets: 147 10*3/uL — ABNORMAL LOW (ref 150–450)
RBC: 5.42 x10E6/uL (ref 4.14–5.80)
RDW: 12.6 % (ref 11.6–15.4)
WBC: 7.6 10*3/uL (ref 3.4–10.8)

## 2021-11-17 LAB — LIPID PANEL
Chol/HDL Ratio: 3.9 ratio (ref 0.0–5.0)
Cholesterol, Total: 187 mg/dL (ref 100–199)
HDL: 48 mg/dL (ref >39)
LDL Chol Calc (NIH): 126 mg/dL — ABNORMAL HIGH (ref 0–99)
Triglycerides: 70 mg/dL (ref 0–149)
VLDL Cholesterol Cal: 13 mg/dL (ref 5–40)

## 2021-11-17 LAB — COMPREHENSIVE METABOLIC PANEL
ALT: 21 IU/L (ref 0–44)
AST: 24 IU/L (ref 0–40)
Albumin/Globulin Ratio: 2.1 (ref 1.2–2.2)
Albumin: 4.4 g/dL (ref 3.8–4.9)
Alkaline Phosphatase: 83 IU/L (ref 44–121)
BUN/Creatinine Ratio: 19 (ref 10–24)
BUN: 21 mg/dL (ref 8–27)
Bilirubin Total: 1.7 mg/dL — ABNORMAL HIGH (ref 0.0–1.2)
CO2: 27 mmol/L (ref 20–29)
Calcium: 9.8 mg/dL (ref 8.6–10.2)
Chloride: 104 mmol/L (ref 96–106)
Creatinine, Ser: 1.12 mg/dL (ref 0.76–1.27)
Globulin, Total: 2.1 g/dL (ref 1.5–4.5)
Glucose: 71 mg/dL (ref 70–99)
Potassium: 4.8 mmol/L (ref 3.5–5.2)
Sodium: 142 mmol/L (ref 134–144)
Total Protein: 6.5 g/dL (ref 6.0–8.5)
eGFR: 75 mL/min/{1.73_m2} (ref >59)

## 2021-11-17 LAB — TSH: TSH: 0.69 u[IU]/mL (ref 0.450–4.500)

## 2021-11-17 MED ORDER — DULOXETINE HCL 60 MG PO CPEP: 60.0000 mg | ORAL_CAPSULE | Freq: Every day | ORAL | 0 refills | Status: DC

## 2021-11-17 NOTE — Telephone Encounter (Signed)
Called and left Patient a VM letting him know his insurance says they will not approve Trintellix unless he has tried and failed more than 2 drugs in the past. He has only failed Lexapro.  Dr Army Melia would like to try patient on cymbalta. Waiting for patient to call back to confirm he wants to try this before we sent this in.

## 2021-11-17 NOTE — Telephone Encounter (Signed)
Patient called back - will try Duloxetine 60 mg daily first before trying Trintellix since insurance requires patient to fail 2 or more drugs before approving Trintellix.

## 2021-11-22 ENCOUNTER — Telehealth: Payer: Self-pay | Admitting: Internal Medicine

## 2021-11-22 NOTE — Telephone Encounter (Signed)
Spoke with patient. Informed him to be sure to get an adequate amount of sleep, eat green leafy veggies, and drink plenty of fluids. Told him this # for platelets is only slightly low but to discuss with Dr Army Melia at next visit if he has any questions.

## 2021-11-22 NOTE — Telephone Encounter (Signed)
Pt called in for assistance. Pt says that he is reviewing his labs. Pt says that he see a difference in his C-diff and platlette number. Pt would like to discuss what he could do to improve those numbers going forward?   Please assist pt further.

## 2021-11-23 ENCOUNTER — Telehealth (INDEPENDENT_AMBULATORY_CARE_PROVIDER_SITE_OTHER): Payer: 59 | Admitting: Internal Medicine

## 2021-11-23 ENCOUNTER — Telehealth (INDEPENDENT_AMBULATORY_CARE_PROVIDER_SITE_OTHER): Payer: 59 | Admitting: Gastroenterology

## 2021-11-23 ENCOUNTER — Other Ambulatory Visit: Payer: Self-pay

## 2021-11-23 ENCOUNTER — Encounter: Payer: Self-pay | Admitting: Gastroenterology

## 2021-11-23 ENCOUNTER — Ambulatory Visit: Payer: Self-pay | Admitting: *Deleted

## 2021-11-23 ENCOUNTER — Encounter: Payer: Self-pay | Admitting: Internal Medicine

## 2021-11-23 VITALS — Ht 71.0 in | Wt 255.4 lb

## 2021-11-23 DIAGNOSIS — U071 COVID-19: Secondary | ICD-10-CM | POA: Diagnosis not present

## 2021-11-23 DIAGNOSIS — K580 Irritable bowel syndrome with diarrhea: Secondary | ICD-10-CM | POA: Diagnosis not present

## 2021-11-23 MED ORDER — PROMETHAZINE-DM 6.25-15 MG/5ML PO SYRP: 5.0000 mL | ORAL_SOLUTION | Freq: Four times a day (QID) | ORAL | 0 refills | Status: DC | PRN

## 2021-11-23 MED ORDER — MOLNUPIRAVIR EUA 200MG CAPSULE: 4.0000 | ORAL_CAPSULE | Freq: Two times a day (BID) | ORAL | 0 refills | Status: AC

## 2021-11-23 NOTE — Progress Notes (Signed)
Derrick Lame, MD [address]  [place]  [place], [place] [address]  Main: [phone]  Fax: [phone]    Gastroenterology Virtual/Video Visit  Referring Provider:     Glean Hess, MD Primary Care Physician:  Glean Hess, MD Primary Gastroenterologist:  Dr.Oneida Mckamey Allen Norris Reason for Consultation:     Irritable bowel syndrome        HPI:    Virtual Visit via Video Note Location of the patient: Home Location of provider: Office  Participating persons: The patient and myself  I connected with Derrick Galvan. on 11/23/21 at  3:15 PM EST by a video enabled telemedicine application and verified that I am speaking with the correct person using two identifiers.   I discussed the limitations of evaluation and management by telemedicine and the availability of in person appointments. The patient expressed understanding and agreed to proceed.  Verbal consent to proceed obtained.  History of Present Illness: Derrick Galvan. is a 61 y.o. male referred by Dr. Army Melia, Jesse Sans, MD  for consultation & management of His general bowel syndrome.  The patient has a history of irritable bowel syndrome and now is following up because he is having excessive gas at night with bloating.  He is not taking the desipramine that I had prescribed because he has been controlling his symptoms for the most part with Lomotil and Imodium.  He denies any unexplained weight loss fevers chills nausea or vomiting.  The patient did not come to his office visit today because he was feeling ill and tested himself at home and was found to have Covid.  There is no report of any rectal bleeding.  Past Medical History:  Diagnosis Date   Achilles tendinitis 06/16/2020   Acute gastritis without hemorrhage    Adaptive colitis 09/19/2013   Arthritis    knees   Chronic diarrhea 07/31/2013   DVT (deep venous thrombosis) ([facility]) 12/21/2018   LLL   GERD (gastroesophageal reflux disease)    Hepatitis B 1989    resolved   Skin cancer    on face - removed under local    Past Surgical History:  Procedure Laterality Date   COLONOSCOPY  02/15/11   repeat 10 years   COLONOSCOPY WITH PROPOFOL N/A 08/15/2016   Procedure: COLONOSCOPY WITH PROPOFOL;  Surgeon: Derrick Lame, MD;  Location: [facility];  Service: Endoscopy;  Laterality: N/A;   COLONOSCOPY WITH PROPOFOL N/A 09/14/2020   Procedure: COLONOSCOPY WITH BIOPSY;  Surgeon: Derrick Lame, MD;  Location: [facility];  Service: Endoscopy;  Laterality: N/A;   ESOPHAGOGASTRODUODENOSCOPY (EGD) WITH PROPOFOL N/A 08/15/2016   Procedure: ESOPHAGOGASTRODUODENOSCOPY (EGD) WITH PROPOFOL;  Surgeon: Derrick Lame, MD;  Location: [facility];  Service: Endoscopy;  Laterality: N/A;   ESOPHAGOGASTRODUODENOSCOPY (EGD) WITH PROPOFOL N/A 09/14/2020   Procedure: ESOPHAGOGASTRODUODENOSCOPY (EGD) WITH PROPOFOL;  Surgeon: Derrick Lame, MD;  Location: [facility];  Service: Endoscopy;  Laterality: N/A;   POLYPECTOMY  08/15/2016   Procedure: POLYPECTOMY;  Surgeon: Derrick Lame, MD;  Location: [facility];  Service: Endoscopy;;   POLYPECTOMY N/A 09/14/2020   Procedure: POLYPECTOMY;  Surgeon: Derrick Lame, MD;  Location: [facility];  Service: Endoscopy;  Laterality: N/A;    Prior to Admission medications   Medication Sig Start Date End Date Taking? Authorizing Provider  acetaminophen (TYLENOL) 500 MG tablet Take 1,000 mg by mouth every 6 (six) hours as needed.    [provider]  bismuth subsalicylate (PEPTO BISMOL)  262 MG/15ML suspension Take 30 mLs by mouth every 6 (six) hours as needed. OTC    [provider]  dicyclomine (BENTYL) 20 MG tablet TAKE (1) TABLET BY MOUTH THREE TIMES A DAY BEFORE MEALS. 01/13/21   Derrick Lame, MD  diphenoxylate-atropine (LOMOTIL) 2.5-0.025 MG tablet TAKE (1) TABLET BY MOUTH FOUR TIMES A DAY AS NEEDED FOR DIARRHEA 08/16/21   Derrick Lame, MD  DULoxetine (CYMBALTA) 60 MG capsule Take 1  capsule (60 mg total) by mouth daily. 11/17/21   Glean Hess, MD  Efinaconazole (JUBLIA) 10 % SOLN Apply topically.    [provider]  famotidine (PEPCID) 10 MG tablet Take 10 mg by mouth 2 (two) times daily. OTC    [provider]  HYDROcodone-acetaminophen (NORCO/VICODIN) 5-325 MG tablet hydrocodone 5 mg-acetaminophen 325 mg tablet    [provider]  ibuprofen (ADVIL) 600 MG tablet Take 1 tablet (600 mg total) by mouth every 8 (eight) hours as needed. 05/19/21   Glean Hess, MD  Loperamide HCl (IMODIUM PO) Take by mouth as needed.    [provider]  meloxicam (MOBIC) 15 MG tablet as needed.    [provider]  molnupiravir EUA (LAGEVRIO) 200 mg CAPS capsule Take 4 capsules (800 mg total) by mouth 2 (two) times daily for 5 days. 11/23/21 11/28/21  Glean Hess, MD  omeprazole (PRILOSEC) 20 MG capsule Take 20 mg by mouth every other day.    [provider]  pregabalin (LYRICA) 50 MG capsule Take 1 capsule (50 mg total) by mouth 2 (two) times daily. 05/19/21   Glean Hess, MD  promethazine-dextromethorphan (PROMETHAZINE-DM) 6.25-15 MG/5ML syrup Take 5 mLs by mouth 4 (four) times daily as needed for cough. 11/23/21   Glean Hess, MD  triamcinolone cream (KENALOG) 0.1 % Apply 1 application topically as needed. HIVES from Azithromy reaction.    [provider]    Family History  Problem Relation Age of Onset   Healthy Mother    COPD Father      Social History   Tobacco Use   Smoking status: Never   Smokeless tobacco: Never  Vaping Use   Vaping Use: Never used  Substance Use Topics   Alcohol use: Yes    Alcohol/week: 0.0 standard drinks    Comment: occasional   Drug use: No    Allergies as of 11/23/2021 - Review Complete 11/23/2021  Allergen Reaction Noted   Azithromycin Hives 11/08/2018   Amoxicillin  06/16/2020   Clindamycin/lincomycin Diarrhea 02/01/2019   Doxycycline Hives and Diarrhea  02/01/2019   Keflex [cephalexin] Swelling 01/10/2019    Review of Systems:    All systems reviewed and negative except where noted in HPI.   Observations/Objective:  Labs: CBC    Component Value Date/Time   WBC 7.6 11/16/2021 1104   RBC 5.42 11/16/2021 1104   HGB 16.9 11/16/2021 1104   HCT 49.2 11/16/2021 1104   PLT 147 (L) 11/16/2021 1104   MCV 91 11/16/2021 1104   MCH 31.2 11/16/2021 1104   MCHC 34.3 11/16/2021 1104   RDW 12.6 11/16/2021 1104   LYMPHSABS 2.5 11/16/2021 1104   EOSABS 0.2 11/16/2021 1104   BASOSABS 0.1 11/16/2021 1104   CMP     Component Value Date/Time   NA 142 11/16/2021 1104   K 4.8 11/16/2021 1104   CL 104 11/16/2021 1104   CO2 27 11/16/2021 1104   GLUCOSE 71 11/16/2021 1104   BUN 21 11/16/2021 1104   CREATININE 1.12  11/16/2021 1104   CALCIUM 9.8 11/16/2021 1104   PROT 6.5 11/16/2021 1104   ALBUMIN 4.4 11/16/2021 1104   AST 24 11/16/2021 1104   ALT 21 11/16/2021 1104   ALKPHOS 83 11/16/2021 1104   BILITOT 1.7 (H) 11/16/2021 1104   GFRNONAA 67 09/02/2020 0905   GFRAA 77 09/02/2020 0905    Imaging Studies: No results found.  Assessment and Plan:   Abdullahi Vallone. is a 61 y.o. very pleasant male here for follow-up of his irritable bowel syndrome.  The patient has been told that he should continue with his present medication and he has been encouraged to start the desipramine.  He has also been told that his symptoms are very consistent with irritable bowel syndrome and he's had a workup including investigation of his bowels and a right upper quadrant ultrasound because he was concerned he may have gallbladder issues.  The gallbladder emptying study was normal.  The patient has been reassured that all of the symptoms are consistent with irritable bowel syndrome and he should continue forward with our plan. The patient voices his concerned that something may be missed and I have explained to him and reassured him that his symptoms are consistent  with irritable bowel syndrome and he's had multiple tests to rule out any other pathology.The patient has been explained the plan and agrees with it.  Follow Up Instructions:  I discussed the assessment and treatment plan with the patient. The patient was provided an opportunity to ask questions and all were answered. The patient agreed with the plan and demonstrated an understanding of the instructions.   The patient was advised to call back or seek an in-person evaluation if the symptoms worsen or if the condition fails to improve as anticipated.  I provided 25 minutes of non-face-to-face time during this encounter.   Derrick Lame, MD  Speech recognition software was used to dictate the above note.

## 2021-11-23 NOTE — Telephone Encounter (Signed)
Chief Complaint: + COVID Symptoms: dry cough, scratchy throat, headache Frequency: symptoms started yesterday Pertinent Negatives: Patient denies fever, SOB Disposition: [] ED /[] Urgent Care (no appt availability in office) / [x] Appointment(In office/virtual)/ []  [facility]/ [] Home Care/ [] Refused Recommended Disposition /[] [facility]/ []  Follow-up with PCP Additional Notes: + COVID, moderate risk  Reason for Disposition  [1] COVID-19 diagnosed by positive lab test (e.g., PCR, rapid self-test kit) AND [2] mild symptoms (e.g., cough, fever, others) AND [0] no complications or SOB  Answer Assessment - Initial Assessment Questions 1. COVID-19 DIAGNOSIS: "Who made your COVID-19 diagnosis?" "Was it confirmed by a positive lab test or self-test?" If not diagnosed by a doctor (or NP/PA), ask "Are there lots of cases (community spread) where you live?" Note: See public health department website, if unsure.     + COVID home test- today 2. COVID-19 EXPOSURE: "Was there any known exposure to COVID before the symptoms began?" CDC Definition of close contact: within 6 feet (2 meters) for a total of 15 minutes or more over a 24-hour period.      unknown 3. ONSET: "When did the COVID-19 symptoms start?"      yesterday 4. WORST SYMPTOM: "What is your worst symptom?" (e.g., cough, fever, shortness of breath, muscle aches)     Sinus drainage, sore throat 5. COUGH: "Do you have a cough?" If Yes, ask: "How bad is the cough?"       Dry cough- intermittent 6. FEVER: "Do you have a fever?" If Yes, ask: "What is your temperature, how was it measured, and when did it start?"     no 7. RESPIRATORY STATUS: "Describe your breathing?" (e.g., shortness of breath, wheezing, unable to speak)      normal 8. BETTER-SAME-WORSE: "Are you getting better, staying the same or getting worse compared to yesterday?"  If getting worse, ask, "In what way?"     Worse- increased symptoms 9. HIGH RISK DISEASE:  "Do you have any chronic medical problems?" (e.g., asthma, heart or lung disease, weak immune system, obesity, etc.)     no 10. VACCINE: "Have you had the COVID-19 vaccine?" If Yes, ask: "Which one, how many shots, when did you get it?"       Yes- Moderna 11. BOOSTER: "Have you received your COVID-19 booster?" If Yes, ask: "Which one and when did you get it?"       Yes- 9/22 12. PREGNANCY: "Is there any chance you are pregnant?" "When was your last menstrual period?"       *No Answer* 13. OTHER SYMPTOMS: "Do you have any other symptoms?"  (e.g., chills, fatigue, headache, loss of smell or taste, muscle pain, sore throat)       Chills, headache  14. O2 SATURATION MONITOR:  "Do you use an oxygen saturation monitor (pulse oximeter) at home?" If Yes, ask "What is your reading (oxygen level) today?" "What is your usual oxygen saturation reading?" (e.g., 95%)       no  Protocols used: Coronavirus (COVID-19) Diagnosed or Suspected-A-AH

## 2021-11-23 NOTE — Progress Notes (Signed)
Date:  11/23/2021   Name:  Derrick Galvan.   DOB:  1961/07/17   MRN:  [id number]   Chief Complaint: Cough Virtual Visit via Video Note  I connected with Derrick Galvan. on 11/23/21 at 11:20 AM EST by a video enabled telemedicine application and verified that I am speaking with the correct person using two identifiers.  Location: Patient: home Provider: [facility]   I discussed the limitations of evaluation and management by telemedicine and the availability of in person appointments. The patient expressed understanding and agreed to proceed.   Cough This is a new problem. The current episode started yesterday. The cough is Non-productive. Associated symptoms include headaches, postnasal drip and a sore throat. Pertinent negatives include no ear pain, fever, shortness of breath or wheezing.  He tested at home for Covid this am.  Lab Results  Component Value Date   NA 142 11/16/2021   K 4.8 11/16/2021   CO2 27 11/16/2021   GLUCOSE 71 11/16/2021   BUN 21 11/16/2021   CREATININE 1.12 11/16/2021   CALCIUM 9.8 11/16/2021   EGFR 75 11/16/2021   GFRNONAA 67 09/02/2020   Lab Results  Component Value Date   CHOL 187 11/16/2021   HDL 48 11/16/2021   LDLCALC 126 (H) 11/16/2021   TRIG 70 11/16/2021   CHOLHDL 3.9 11/16/2021   Lab Results  Component Value Date   TSH 0.690 11/16/2021   No results found for: HGBA1C Lab Results  Component Value Date   WBC 7.6 11/16/2021   HGB 16.9 11/16/2021   HCT 49.2 11/16/2021   MCV 91 11/16/2021   PLT 147 (L) 11/16/2021   Lab Results  Component Value Date   ALT 21 11/16/2021   AST 24 11/16/2021   ALKPHOS 83 11/16/2021   BILITOT 1.7 (H) 11/16/2021   No results found for: 25OHVITD2, 25OHVITD3, VD25OH   Review of Systems  Constitutional:  Positive for fatigue. Negative for diaphoresis and fever.  HENT:  Positive for congestion, postnasal drip and sore throat. Negative for ear pain.   Respiratory:  Positive for cough.  Negative for chest tightness, shortness of breath and wheezing.   Gastrointestinal:  Negative for diarrhea, nausea and vomiting.  Neurological:  Positive for headaches. Negative for dizziness and light-headedness.   Patient Active Problem List   Diagnosis Date Noted   Lymphedema 08/24/2021   Post-phlebitic syndrome 08/24/2021   Gastroesophageal reflux disease without esophagitis    Personal history of colonic polyps    Current moderate episode of major depressive disorder without prior episode (Derrick Galvan) 09/02/2020   Varicose veins of right lower extremity with complications 27/05/8674   Generalized anxiety disorder 08/22/2019   SCCA (squamous cell carcinoma) of skin 02/01/2019   DVT (deep venous thrombosis) ([facility]) 01/21/2019   Sleep disturbance 12/18/2018   Benign neoplasm of ascending colon    Adaptive colitis 09/19/2013    Allergies  Allergen Reactions   Azithromycin Hives   Amoxicillin    Clindamycin/Lincomycin Diarrhea   Doxycycline Hives and Diarrhea   Keflex [Cephalexin] Swelling    Past Surgical History:  Procedure Laterality Date   COLONOSCOPY  02/15/11   repeat 10 years   COLONOSCOPY WITH PROPOFOL N/A 08/15/2016   Procedure: COLONOSCOPY WITH PROPOFOL;  Surgeon: Lucilla Lame, MD;  Location: [facility];  Service: Endoscopy;  Laterality: N/A;   COLONOSCOPY WITH PROPOFOL N/A 09/14/2020   Procedure: COLONOSCOPY WITH BIOPSY;  Surgeon: Lucilla Lame, MD;  Location: [facility];  Service: Endoscopy;  Laterality: N/A;   ESOPHAGOGASTRODUODENOSCOPY (EGD) WITH PROPOFOL N/A 08/15/2016   Procedure: ESOPHAGOGASTRODUODENOSCOPY (EGD) WITH PROPOFOL;  Surgeon: Lucilla Lame, MD;  Location: [facility];  Service: Endoscopy;  Laterality: N/A;   ESOPHAGOGASTRODUODENOSCOPY (EGD) WITH PROPOFOL N/A 09/14/2020   Procedure: ESOPHAGOGASTRODUODENOSCOPY (EGD) WITH PROPOFOL;  Surgeon: Lucilla Lame, MD;  Location: [facility];  Service: Endoscopy;  Laterality: N/A;   POLYPECTOMY   08/15/2016   Procedure: POLYPECTOMY;  Surgeon: Lucilla Lame, MD;  Location: [facility];  Service: Endoscopy;;   POLYPECTOMY N/A 09/14/2020   Procedure: POLYPECTOMY;  Surgeon: Lucilla Lame, MD;  Location: [facility];  Service: Endoscopy;  Laterality: N/A;    Social History   Tobacco Use   Smoking status: Never   Smokeless tobacco: Never  Vaping Use   Vaping Use: Never used  Substance Use Topics   Alcohol use: Yes    Alcohol/week: 0.0 standard drinks    Comment: occasional   Drug use: No     Medication list has been reviewed and updated.  Current Meds  Medication Sig   acetaminophen (TYLENOL) 500 MG tablet Take 1,000 mg by mouth every 6 (six) hours as needed.   bismuth subsalicylate (PEPTO BISMOL) 262 MG/15ML suspension Take 30 mLs by mouth every 6 (six) hours as needed. OTC   dicyclomine (BENTYL) 20 MG tablet TAKE (1) TABLET BY MOUTH THREE TIMES A DAY BEFORE MEALS.   diphenoxylate-atropine (LOMOTIL) 2.5-0.025 MG tablet TAKE (1) TABLET BY MOUTH FOUR TIMES A DAY AS NEEDED FOR DIARRHEA   DULoxetine (CYMBALTA) 60 MG capsule Take 1 capsule (60 mg total) by mouth daily.   Efinaconazole (JUBLIA) 10 % SOLN Apply topically.   famotidine (PEPCID) 10 MG tablet Take 10 mg by mouth 2 (two) times daily. OTC   HYDROcodone-acetaminophen (NORCO/VICODIN) 5-325 MG tablet hydrocodone 5 mg-acetaminophen 325 mg tablet   ibuprofen (ADVIL) 600 MG tablet Take 1 tablet (600 mg total) by mouth every 8 (eight) hours as needed.   Loperamide HCl (IMODIUM PO) Take by mouth as needed.   meloxicam (MOBIC) 15 MG tablet as needed.   omeprazole (PRILOSEC) 20 MG capsule Take 20 mg by mouth every other day.   pregabalin (LYRICA) 50 MG capsule Take 1 capsule (50 mg total) by mouth 2 (two) times daily.   triamcinolone cream (KENALOG) 0.1 % Apply 1 application topically as needed. HIVES from Azithromy reaction.    PHQ 2/9 Scores 11/16/2021 05/17/2021 09/02/2020 02/25/2020  PHQ - 2 Score 4 3 4 1   PHQ- 9  Score 14 9 10 1     GAD 7 : Generalized Anxiety Score 11/16/2021 05/17/2021 09/02/2020 02/25/2020  Nervous, Anxious, on Edge 3 2 2 3   Control/stop worrying 3 3 2 1   Worry too much - different things 3 3 2 1   Trouble relaxing 3 2 0 2  Restless 1 0 0 0  Easily annoyed or irritable 3 2 2 3   Afraid - awful might happen 3 3 2 1   Total GAD 7 Score 19 15 10 11   Anxiety Difficulty Very difficult Not difficult at all Not difficult at all Somewhat difficult    BP Readings from Last 3 Encounters:  11/16/21 136/78  08/24/21 (!) 149/80  05/17/21 122/70    Physical Exam Constitutional:      Appearance: He is ill-appearing.  Pulmonary:     Effort: Pulmonary effort is normal.  Neurological:     General: No focal deficit present.     Mental Status: He is alert.  Psychiatric:  Mood and Affect: Mood normal.        Behavior: Behavior normal.    Wt Readings from Last 3 Encounters:  11/23/21 255 lb 6.4 oz (115.8 kg)  11/16/21 255 lb 6.4 oz (115.8 kg)  08/24/21 254 lb (115.2 kg)    Ht 5' 11"  (1.803 m)    Wt 255 lb 6.4 oz (115.8 kg)    BMI 35.62 kg/m   Assessment and Plan: 1. COVID-19 virus infection Take Tylenol 650 - 1000 mg every 6-8 hours for fever, body aches and headache. Drink plenty of fluids with electrolytes. Monitor for fever that does not decrease and/or shortness of breath that worsens or is present at rest. Quarantine for 5 days - letter to employer stating the same. - molnupiravir EUA (LAGEVRIO) 200 mg CAPS capsule; Take 4 capsules (800 mg total) by mouth 2 (two) times daily for 5 days.  Dispense: 40 capsule; Refill: 0 - promethazine-dextromethorphan (PROMETHAZINE-DM) 6.25-15 MG/5ML syrup; Take 5 mLs by mouth 4 (four) times daily as needed for cough.  Dispense: 118 mL; Refill: 0  I spent 7 minutes on this encounter, 100% by video. Partially dictated using Editor, commissioning. Any errors are unintentional.  Halina Maidens, MD [facility]  11/23/2021

## 2021-11-23 NOTE — Telephone Encounter (Signed)
Pt has scratchy throat, runny nose, sneezing and tested positive for covid.  Pt is interested and wonders if he should get the antiviral medication.  (No fever)   Attempted to call patient- left message for patient to call office

## 2021-11-23 NOTE — Patient Instructions (Signed)
Take Tylenol 650 - 1000 mg every 6-8 hours for fever, body aches and headache. Drink plenty of fluids with electrolytes. Monitor for fever that does not decrease and/or shortness of breath that worsens or is present at rest. Quarantine for 5 days.  

## 2021-11-26 ENCOUNTER — Telehealth: Payer: Self-pay | Admitting: Internal Medicine

## 2021-11-26 NOTE — Telephone Encounter (Signed)
Patient states he is in the the third day of antiviral- he does not report he is seeing any improvement in his symptoms. Patient states he has experienced diarrhea since starting the medication. Patient states he has lost taste and smell. Patient is trying to hydrate. Patient states he has sinus drainage at night that causes severe pain at night with swallowing. Patient is experience pain between shoulder blades. No SOB. Patient is seeing changes in sputum- was clear and now light yellow. Patient states he is having trouble sleeping at night. Presently: antiviral, tylenol 500 mg, Mucinex

## 2021-11-26 NOTE — Telephone Encounter (Signed)
Spoke to pt let him know that he is experiencing normal Covid symptoms to complete course of antiviral and take tylenol and  Mucinex as well. Told pt if he experiences and SOB, weakness or fever that wont break to go to the ED.  Pt verbalized understanding.  KP

## 2021-11-26 NOTE — Telephone Encounter (Signed)
Copied from [facility] [place] [id number]. Topic: General - Other >> Nov 26, 2021  9:10 AM Leward Quan A wrote: Reason for CRM: Patient called in to inform Dr Army Melia that his symptoms have gotten worst say that he is not having any fever right now did bring up some phlemg light yellow and feeling like whatever isgoing on its moving into his chest also have lost sense of taste and smell taking antivirals as prescribed drinking plenty of fluids and taking OTC cold and flu medication and Tylenol. Does have some slight discomfort in the middle of back and no difficulty in breathing other than clogged sinus. Any questions please call  Ph# [phone]

## 2021-12-31 ENCOUNTER — Ambulatory Visit: Payer: Self-pay | Admitting: Internal Medicine

## 2022-01-11 DIAGNOSIS — I447 Left bundle-branch block, unspecified: Secondary | ICD-10-CM | POA: Insufficient documentation

## 2022-01-11 DIAGNOSIS — E782 Mixed hyperlipidemia: Secondary | ICD-10-CM | POA: Insufficient documentation

## 2022-01-12 ENCOUNTER — Other Ambulatory Visit: Payer: Self-pay

## 2022-01-12 ENCOUNTER — Encounter: Payer: Self-pay | Admitting: Internal Medicine

## 2022-01-12 ENCOUNTER — Ambulatory Visit: Payer: Self-pay | Admitting: Internal Medicine

## 2022-01-12 ENCOUNTER — Ambulatory Visit (INDEPENDENT_AMBULATORY_CARE_PROVIDER_SITE_OTHER): Payer: 59 | Admitting: Internal Medicine

## 2022-01-12 VITALS — BP 112/82 | HR 81 | Ht 71.0 in | Wt 255.0 lb

## 2022-01-12 DIAGNOSIS — R0602 Shortness of breath: Secondary | ICD-10-CM | POA: Diagnosis not present

## 2022-01-12 DIAGNOSIS — F321 Major depressive disorder, single episode, moderate: Secondary | ICD-10-CM

## 2022-01-12 NOTE — Progress Notes (Signed)
Date:  01/12/2022   Name:  Derrick Galvan.   DOB:  [birth year]   MRN:  [id number]   Chief Complaint: Depression and Chest Pain (Feels like it may be fluid in left side, has scaring on left lobe)  Depression        This is a chronic problem.  The problem has been resolved since onset.  Associated symptoms include fatigue, irritable, decreased interest and sad.  Associated symptoms include no headaches and no suicidal ideas.  Treatments tried: trintellix helped but not covered by insurance.  Compliance with prior treatments: given cymbalta but decided not to take it. Chest Pain  This is a recurrent problem. The pain is present in the lateral region. The pain is mild. The pain does not radiate. Associated symptoms include shortness of breath. Pertinent negatives include no cough, dizziness, fever or headaches. The pain is aggravated by breathing.  He is scheduled for a nuclear stress test and ECHO with Dr. Gwen Pounds in the near future.  Lab Results  Component Value Date   NA 142 11/16/2021   K 4.8 11/16/2021   CO2 27 11/16/2021   GLUCOSE 71 11/16/2021   BUN 21 11/16/2021   CREATININE 1.12 11/16/2021   CALCIUM 9.8 11/16/2021   EGFR 75 11/16/2021   GFRNONAA 67 09/02/2020   Lab Results  Component Value Date   CHOL 187 11/16/2021   HDL 48 11/16/2021   LDLCALC 126 (H) 11/16/2021   TRIG 70 11/16/2021   CHOLHDL 3.9 11/16/2021   Lab Results  Component Value Date   TSH 0.690 11/16/2021   No results found for: HGBA1C Lab Results  Component Value Date   WBC 7.6 11/16/2021   HGB 16.9 11/16/2021   HCT 49.2 11/16/2021   MCV 91 11/16/2021   PLT 147 (L) 11/16/2021   Lab Results  Component Value Date   ALT 21 11/16/2021   AST 24 11/16/2021   ALKPHOS 83 11/16/2021   BILITOT 1.7 (H) 11/16/2021   No results found for: 25OHVITD2, 25OHVITD3, VD25OH   Review of Systems  Constitutional:  Positive for fatigue. Negative for chills, fever and unexpected weight change.  Respiratory:   Positive for shortness of breath. Negative for cough and chest tightness.   Cardiovascular:  Positive for chest pain.  Neurological:  Negative for dizziness and headaches.  Psychiatric/Behavioral:  Positive for depression and dysphoric mood. Negative for sleep disturbance and suicidal ideas. The patient is nervous/anxious.    Patient Active Problem List   Diagnosis Date Noted   Lymphedema 08/24/2021   Post-phlebitic syndrome 08/24/2021   Gastroesophageal reflux disease without esophagitis    Personal history of colonic polyps    Current moderate episode of major depressive disorder without prior episode (HCC) 09/02/2020   Varicose veins of right lower extremity with complications 09/01/2020   Generalized anxiety disorder 08/22/2019   SCCA (squamous cell carcinoma) of skin 02/01/2019   DVT (deep venous thrombosis) (HCC) 01/21/2019   Sleep disturbance 12/18/2018   Benign neoplasm of ascending colon    Adaptive colitis 09/19/2013    Allergies  Allergen Reactions   Azithromycin Hives   Amoxicillin    Clindamycin/Lincomycin Diarrhea   Doxycycline Hives and Diarrhea   Keflex [Cephalexin] Swelling    Past Surgical History:  Procedure Laterality Date   COLONOSCOPY  02/15/11   repeat 10 years   COLONOSCOPY WITH PROPOFOL N/A 08/15/2016   Procedure: COLONOSCOPY WITH PROPOFOL;  Surgeon: Midge Minium, MD;  Location: [facility];  Service: Endoscopy;  Laterality: N/A;   COLONOSCOPY WITH PROPOFOL N/A 09/14/2020   Procedure: COLONOSCOPY WITH BIOPSY;  Surgeon: Midge Minium, MD;  Location: [facility];  Service: Endoscopy;  Laterality: N/A;   ESOPHAGOGASTRODUODENOSCOPY (EGD) WITH PROPOFOL N/A 08/15/2016   Procedure: ESOPHAGOGASTRODUODENOSCOPY (EGD) WITH PROPOFOL;  Surgeon: Midge Minium, MD;  Location: [facility];  Service: Endoscopy;  Laterality: N/A;   ESOPHAGOGASTRODUODENOSCOPY (EGD) WITH PROPOFOL N/A 09/14/2020   Procedure: ESOPHAGOGASTRODUODENOSCOPY (EGD) WITH PROPOFOL;   Surgeon: Midge Minium, MD;  Location: [facility];  Service: Endoscopy;  Laterality: N/A;   POLYPECTOMY  08/15/2016   Procedure: POLYPECTOMY;  Surgeon: Midge Minium, MD;  Location: [facility];  Service: Endoscopy;;   POLYPECTOMY N/A 09/14/2020   Procedure: POLYPECTOMY;  Surgeon: Midge Minium, MD;  Location: [facility];  Service: Endoscopy;  Laterality: N/A;    Social History   Tobacco Use   Smoking status: Never   Smokeless tobacco: Never  Vaping Use   Vaping Use: Never used  Substance Use Topics   Alcohol use: Yes    Alcohol/week: 0.0 standard drinks    Comment: occasional   Drug use: No     Medication list has been reviewed and updated.  Current Meds  Medication Sig   acetaminophen (TYLENOL) 500 MG tablet Take 1,000 mg by mouth every 6 (six) hours as needed.   ASHWAGANDHA PO Take by mouth.   bismuth subsalicylate (PEPTO BISMOL) 262 MG/15ML suspension Take 30 mLs by mouth every 6 (six) hours as needed. OTC   dicyclomine (BENTYL) 20 MG tablet TAKE (1) TABLET BY MOUTH THREE TIMES A DAY BEFORE MEALS.   diphenoxylate-atropine (LOMOTIL) 2.5-0.025 MG tablet TAKE (1) TABLET BY MOUTH FOUR TIMES A DAY AS NEEDED FOR DIARRHEA   famotidine (PEPCID) 10 MG tablet Take 10 mg by mouth 2 (two) times daily. OTC   ibuprofen (ADVIL) 600 MG tablet Take 1 tablet (600 mg total) by mouth every 8 (eight) hours as needed.   Loperamide HCl (IMODIUM PO) Take by mouth as needed.   Multiple Vitamin (MULTI-VITAMIN PO) Take by mouth daily.   Probiotic Product (PROBIOTIC PO) Take by mouth.   triamcinolone cream (KENALOG) 0.1 % Apply 1 application topically as needed. HIVES from Azithromy reaction.   UNABLE TO FIND Med Name: Red Algae   [DISCONTINUED] DULoxetine (CYMBALTA) 60 MG capsule Take 1 capsule (60 mg total) by mouth daily.   [DISCONTINUED] meloxicam (MOBIC) 15 MG tablet as needed.    PHQ 2/9 Scores 01/12/2022 11/16/2021 05/17/2021 09/02/2020  PHQ - 2 Score 2 4 3 4   PHQ- 9 Score  5 14 9 10     GAD 7 : Generalized Anxiety Score 01/12/2022 11/16/2021 05/17/2021 09/02/2020  Nervous, Anxious, on Edge 1 3 2 2   Control/stop worrying 1 3 3 2   Worry too much - different things 1 3 3 2   Trouble relaxing 1 3 2  0  Restless 0 1 0 0  Easily annoyed or irritable 1 3 2 2   Afraid - awful might happen 1 3 3 2   Total GAD 7 Score 6 19 15 10   Anxiety Difficulty - Very difficult Not difficult at all Not difficult at all    BP Readings from Last 3 Encounters:  01/12/22 112/82  11/16/21 136/78  08/24/21 (!) 149/80    Physical Exam Vitals and nursing note reviewed.  Constitutional:      General: He is irritable. He is not in acute distress.    Appearance: He is well-developed.  HENT:     Head:  Normocephalic and atraumatic.  Cardiovascular:     Rate and Rhythm: Normal rate and regular rhythm.     Heart sounds: Normal heart sounds.    No friction rub. No gallop.  Pulmonary:     Effort: Pulmonary effort is normal. No respiratory distress.     Breath sounds: Normal breath sounds. No wheezing or rhonchi.  Chest:     Chest wall: No mass or deformity.  Musculoskeletal:        General: Normal range of motion.     Cervical back: Normal range of motion.     Right lower leg: No edema.  Skin:    General: Skin is warm and dry.     Findings: No rash.  Neurological:     Mental Status: He is alert and oriented to person, place, and time.  Psychiatric:        Mood and Affect: Mood normal.        Behavior: Behavior normal.    Wt Readings from Last 3 Encounters:  01/12/22 255 lb (115.7 kg)  11/23/21 255 lb 6.4 oz (115.8 kg)  11/16/21 255 lb 6.4 oz (115.8 kg)    BP 112/82   Pulse 81   Ht 5\' 11"  (1.803 m)   Wt 255 lb (115.7 kg)   SpO2 95%   BMI 35.57 kg/m   Assessment and Plan: 1. Shortness of breath Waiting for cardiac stress test and ECHO to rule out cardiac etiology If workup is negative, would refer to Pulmonary/PFTs. Continue current exercise routine with minimal  cardio Check pulse Ox immediately after exercise to determine if there is a drop  2. Current moderate episode of major depressive disorder without prior episode [facility]) Did fairly well on Trintellix but not covered by insurance until he tries two other generic. He was prescribed Cymbalta but he has elected not to start it at this time.   Partially dictated using Animal nutritionist. Any errors are unintentional.  Bari Edward, MD [facility]  01/12/2022

## 2022-02-22 ENCOUNTER — Telehealth: Payer: Self-pay | Admitting: Gastroenterology

## 2022-02-22 ENCOUNTER — Encounter (INDEPENDENT_AMBULATORY_CARE_PROVIDER_SITE_OTHER): Payer: Self-pay

## 2022-02-22 ENCOUNTER — Ambulatory Visit (INDEPENDENT_AMBULATORY_CARE_PROVIDER_SITE_OTHER): Payer: 59 | Admitting: Vascular Surgery

## 2022-02-22 NOTE — Telephone Encounter (Signed)
Patient called stating that his symptoms (diarrhea and abd pain) have gotten severe and is wanting a sooner appt. Requesting call back. ?

## 2022-02-23 ENCOUNTER — Ambulatory Visit (INDEPENDENT_AMBULATORY_CARE_PROVIDER_SITE_OTHER): Payer: 59 | Admitting: Internal Medicine

## 2022-02-23 ENCOUNTER — Encounter: Payer: Self-pay | Admitting: Internal Medicine

## 2022-02-23 VITALS — BP 120/70 | HR 86 | Temp 97.9°F | Ht 71.0 in | Wt 251.4 lb

## 2022-02-23 DIAGNOSIS — R197 Diarrhea, unspecified: Secondary | ICD-10-CM

## 2022-02-23 NOTE — Progress Notes (Signed)
? ? ?Date:  02/23/2022  ? ?Name:  Derrick Galvan.   DOB:  1960-11-13   MRN:  [id number] ? ? ?Chief Complaint: Abdominal Pain (Pain, cramping, lots of gas noise, diarrhea since Thursday, states smells like sulfur. No fever, some chills, pt is nauseated as well. ) ? ?Diarrhea  ?This is a new problem. The current episode started in the past 7 days. The problem occurs 5 to 10 times per day. The problem has been unchanged. The stool consistency is described as Mucous and watery (and yellow). The patient states that diarrhea awakens him from sleep. Associated symptoms include abdominal pain, bloating and increased flatus. Pertinent negatives include no chills, fever, sweats, vomiting or weight loss. Exacerbated by: eating. Risk factors include suspect food intake (prepared salad from [place] and [place]; he also handles chicks at work). He has tried anti-motility drug and increased fluids for the symptoms. The treatment provided moderate relief.  ? ?Lab Results  ?Component Value Date  ? NA 142 11/16/2021  ? K 4.8 11/16/2021  ? CO2 27 11/16/2021  ? GLUCOSE 71 11/16/2021  ? BUN 21 11/16/2021  ? CREATININE 1.12 11/16/2021  ? CALCIUM 9.8 11/16/2021  ? EGFR 75 11/16/2021  ? GFRNONAA 67 09/02/2020  ? ?Lab Results  ?Component Value Date  ? CHOL 187 11/16/2021  ? HDL 48 11/16/2021  ? LDLCALC 126 (H) 11/16/2021  ? TRIG 70 11/16/2021  ? CHOLHDL 3.9 11/16/2021  ? ?Lab Results  ?Component Value Date  ? TSH 0.690 11/16/2021  ? ?No results found for: HGBA1C ?Lab Results  ?Component Value Date  ? WBC 7.6 11/16/2021  ? HGB 16.9 11/16/2021  ? HCT 49.2 11/16/2021  ? MCV 91 11/16/2021  ? PLT 147 (L) 11/16/2021  ? ?Lab Results  ?Component Value Date  ? ALT 21 11/16/2021  ? AST 24 11/16/2021  ? ALKPHOS 83 11/16/2021  ? BILITOT 1.7 (H) 11/16/2021  ? ?No results found for: 25OHVITD2, [place], VD25OH  ? ?Review of Systems  ?Constitutional:  Negative for chills, fatigue, fever and weight loss.  ?Respiratory:  Negative for chest tightness and  shortness of breath.   ?Cardiovascular:  Negative for chest pain and palpitations.  ?Gastrointestinal:  Positive for abdominal pain, bloating, diarrhea, flatus and nausea. Negative for blood in stool and vomiting.  ?Neurological:  Negative for dizziness, weakness and light-headedness.  ?Psychiatric/Behavioral:  Positive for dysphoric mood. Negative for sleep disturbance. The patient is nervous/anxious.   ? ?Patient Active Problem List  ? Diagnosis Date Noted  ? LBBB (left bundle branch block) 01/11/2022  ? Hyperlipidemia, mixed 01/11/2022  ? Lymphedema 08/24/2021  ? Post-phlebitic syndrome 08/24/2021  ? Gastroesophageal reflux disease without esophagitis   ? Personal history of colonic polyps   ? Current moderate episode of major depressive disorder without prior episode ([place]) 09/02/2020  ? Varicose veins of right lower extremity with complications 46/27/0350  ? Generalized anxiety disorder 08/22/2019  ? SCCA (squamous cell carcinoma) of skin 02/01/2019  ? Sleep disturbance 12/18/2018  ? Benign neoplasm of ascending colon   ? Adaptive colitis 09/19/2013  ? ? ?Allergies  ?Allergen Reactions  ? Azithromycin Hives  ? Amoxicillin   ? Clindamycin/Lincomycin Diarrhea  ? Doxycycline Hives and Diarrhea  ? Keflex [Cephalexin] Swelling  ? ? ?Past Surgical History:  ?Procedure Laterality Date  ? COLONOSCOPY  02/15/11  ? repeat 10 years  ? COLONOSCOPY WITH PROPOFOL N/A 08/15/2016  ? Procedure: COLONOSCOPY WITH PROPOFOL;  Surgeon: Lucilla Lame, MD;  Location: [place];  Service: Endoscopy;  Laterality: N/A;  ? COLONOSCOPY WITH PROPOFOL N/A 09/14/2020  ? Procedure: COLONOSCOPY WITH BIOPSY;  Surgeon: Lucilla Lame, MD;  Location: [place];  Service: Endoscopy;  Laterality: N/A;  ? ESOPHAGOGASTRODUODENOSCOPY (EGD) WITH PROPOFOL N/A 08/15/2016  ? Procedure: ESOPHAGOGASTRODUODENOSCOPY (EGD) WITH PROPOFOL;  Surgeon: Lucilla Lame, MD;  Location: [place];  Service: Endoscopy;  Laterality: N/A;  ?  ESOPHAGOGASTRODUODENOSCOPY (EGD) WITH PROPOFOL N/A 09/14/2020  ? Procedure: ESOPHAGOGASTRODUODENOSCOPY (EGD) WITH PROPOFOL;  Surgeon: Lucilla Lame, MD;  Location: [place];  Service: Endoscopy;  Laterality: N/A;  ? POLYPECTOMY  08/15/2016  ? Procedure: POLYPECTOMY;  Surgeon: Lucilla Lame, MD;  Location: [place];  Service: Endoscopy;;  ? POLYPECTOMY N/A 09/14/2020  ? Procedure: POLYPECTOMY;  Surgeon: Lucilla Lame, MD;  Location: [place];  Service: Endoscopy;  Laterality: N/A;  ? ? ?Social History  ? ?Tobacco Use  ? Smoking status: Never  ? Smokeless tobacco: Never  ?Vaping Use  ? Vaping Use: Never used  ?Substance Use Topics  ? Alcohol use: Yes  ?  Alcohol/week: 0.0 standard drinks  ?  Comment: occasional  ? Drug use: No  ? ? ? ?Medication list has been reviewed and updated. ? ?Current Meds  ?Medication Sig  ? acetaminophen (TYLENOL) 500 MG tablet Take 1,000 mg by mouth every 6 (six) hours as needed.  ? ALPRAZolam (XANAX) 0.5 MG tablet Take 1 tablet by mouth as needed.  ? ASHWAGANDHA PO Take by mouth.  ? bismuth subsalicylate (PEPTO BISMOL) 262 MG/15ML suspension Take 30 mLs by mouth every 6 (six) hours as needed. OTC  ? ciclopirox (PENLAC) 8 % solution Apply topically.  ? dicyclomine (BENTYL) 20 MG tablet TAKE (1) TABLET BY MOUTH THREE TIMES A DAY BEFORE MEALS.  ? diphenoxylate-atropine (LOMOTIL) 2.5-0.025 MG tablet TAKE (1) TABLET BY MOUTH FOUR TIMES A DAY AS NEEDED FOR DIARRHEA  ? famotidine (PEPCID) 10 MG tablet Take 10 mg by mouth 2 (two) times daily. OTC  ? ibuprofen (ADVIL) 600 MG tablet Take 1 tablet (600 mg total) by mouth every 8 (eight) hours as needed.  ? Loperamide HCl (IMODIUM PO) Take by mouth as needed.  ? Multiple Vitamin (MULTI-VITAMIN PO) Take by mouth daily.  ? Probiotic Product (PROBIOTIC PO) Take by mouth.  ? triamcinolone cream (KENALOG) 0.1 % Apply 1 application topically as needed. HIVES from Azithromy reaction.  ? UNABLE TO FIND Med Name: Red Algae  ? ? ? ?   01/12/2022  ?  1:58 PM 11/16/2021  ? 10:29 AM 05/17/2021  ?  9:01 AM 09/02/2020  ?  8:13 AM  ?GAD 7 : Generalized Anxiety Score  ?Nervous, Anxious, on Edge _0 ?Control/stop worrying _1 ?Worry too much - different things _2 ?Trouble relaxing _3 0  ?Restless 0 1 0 0  ?Easily annoyed or irritable _4 ?Afraid - awful might happen _5 ?Total GAD 7 Score _6 ?Anxiety Difficulty  Very difficult Not difficult at all Not difficult at all  ? ? ? ?  01/12/2022  ?  1:58 PM  ?Depression screen PHQ 2/9  ?Decreased Interest 1  ?Down, Depressed, Hopeless 1  ?PHQ - 2 Score 2  ?Altered sleeping 1  ?Tired, decreased energy 1  ?Change in appetite 0  ?Feeling bad or failure about yourself  1  ?Trouble concentrating 0  ?Moving slowly or fidgety/restless 0  ?  Suicidal thoughts 0  ?PHQ-9 Score 5  ?Difficult doing work/chores Very difficult  ? ? ?BP Readings from Last 3 Encounters:  ?02/23/22 120/70  ?01/12/22 112/82  ?11/16/21 136/78  ? ? ?Physical Exam ?Vitals and nursing note reviewed.  ?Constitutional:   ?   General: He is not in acute distress. ?   Appearance: He is well-developed.  ?HENT:  ?   Head: Normocephalic and atraumatic.  ?Cardiovascular:  ?   Rate and Rhythm: Normal rate and regular rhythm.  ?Pulmonary:  ?   Effort: Pulmonary effort is normal. No respiratory distress.  ?   Breath sounds: No wheezing or rhonchi.  ?Abdominal:  ?   General: Bowel sounds are increased. There is no distension.  ?   Palpations: Abdomen is soft. There is no fluid wave, hepatomegaly or splenomegaly.  ?   Tenderness: There is no abdominal tenderness. There is no guarding or rebound.  ?Skin: ?   General: Skin is warm and dry.  ?   Findings: No rash.  ?Neurological:  ?   Mental Status: He is alert and oriented to person, place, and time.  ?Psychiatric:     ?   Mood and Affect: Mood normal.     ?   Behavior: Behavior normal.  ? ? ?Wt Readings from Last 3 Encounters:  ?02/23/22 251 lb 6.4 oz (114 kg)  ?01/12/22 255 lb  (115.7 kg)  ?11/23/21 255 lb 6.4 oz (115.8 kg)  ? ? ?BP 120/70   Pulse 86   Temp 97.9 ?F (36.6 ?C)   Ht _0  (1.803 m)   Wt 251 lb 6.4 oz (114 kg)   SpO2 97%   BMI 35.06 kg/m?  ? ?Assessment and Plan: ?1. Diarrhea of pre

## 2022-02-25 ENCOUNTER — Telehealth: Payer: Self-pay | Admitting: Internal Medicine

## 2022-02-25 NOTE — Telephone Encounter (Signed)
Spoke to pt let him know that we have not received his results back yet. Pt verbalized understanding. ? ?KP ?

## 2022-02-25 NOTE — Telephone Encounter (Signed)
Copied from [place] [phone]. Topic: General - Other ?>> Feb 25, 2022  2:53 PM Pawlus, Brayton Layman A wrote: ?Reason for CRM: Pt called in to go over latest lab results, please advise. ?

## 2022-02-28 LAB — GI PROFILE, STOOL, PCR

## 2022-03-07 ENCOUNTER — Ambulatory Visit: Payer: 59 | Admitting: Gastroenterology

## 2022-03-07 NOTE — Progress Notes (Deleted)
Primary Care Physician: Glean Hess, MD  Primary Gastroenterologist:  Dr. Lucilla Lame  No chief complaint on file.   HPI: Derrick Galvan. is a 61 y.o. male here for follow-up of his Irritable bowel syndrome.  The patient had been seen by me last through video visit and had reported at that time he was not taking desipramine because he reported to be is well controlled on Lomotil and Imodium. The patient has had 2 EGDs and 2 colonoscopies since 2017 with the last one being in 2021 and the recommendation for his next one to be in 5 years after last one.  Past Medical History:  Diagnosis Date   Achilles tendinitis 06/16/2020   Acute gastritis without hemorrhage    Adaptive colitis 09/19/2013   Arthritis    knees   Chronic diarrhea 07/31/2013   DVT (deep venous thrombosis) (HCC) 12/21/2018   LLL   GERD (gastroesophageal reflux disease)    Hepatitis B 1989   resolved   Skin cancer    on face - removed under local    Current Outpatient Medications  Medication Sig Dispense Refill   acetaminophen (TYLENOL) 500 MG tablet Take 1,000 mg by mouth every 6 (six) hours as needed.     ALPRAZolam (XANAX) 0.5 MG tablet Take 1 tablet by mouth as needed.     ASHWAGANDHA PO Take by mouth.     bismuth subsalicylate (PEPTO BISMOL) 262 MG/15ML suspension Take 30 mLs by mouth every 6 (six) hours as needed. OTC     ciclopirox (PENLAC) 8 % solution Apply topically.     dicyclomine (BENTYL) 20 MG tablet TAKE (1) TABLET BY MOUTH THREE TIMES A DAY BEFORE MEALS. 90 tablet 3   diphenoxylate-atropine (LOMOTIL) 2.5-0.025 MG tablet TAKE (1) TABLET BY MOUTH FOUR TIMES A DAY AS NEEDED FOR DIARRHEA 120 tablet 5   famotidine (PEPCID) 10 MG tablet Take 10 mg by mouth 2 (two) times daily. OTC     ibuprofen (ADVIL) 600 MG tablet Take 1 tablet (600 mg total) by mouth every 8 (eight) hours as needed. 100 tablet 5   Loperamide HCl (IMODIUM PO) Take by mouth as needed.     Multiple Vitamin (MULTI-VITAMIN PO) Take  by mouth daily.     Probiotic Product (PROBIOTIC PO) Take by mouth.     triamcinolone cream (KENALOG) 0.1 % Apply 1 application topically as needed. HIVES from Azithromy reaction.     UNABLE TO FIND Med Name: Red Algae     No current facility-administered medications for this visit.    Allergies as of 03/07/2022 - Review Complete 02/23/2022  Allergen Reaction Noted   Azithromycin Hives 11/08/2018   Amoxicillin  06/16/2020   Clindamycin/lincomycin Diarrhea 02/01/2019   Doxycycline Hives and Diarrhea 02/01/2019   Keflex [cephalexin] Swelling 01/10/2019    ROS:  General: Negative for anorexia, weight loss, fever, chills, fatigue, weakness. ENT: Negative for hoarseness, difficulty swallowing , nasal congestion. CV: Negative for chest pain, angina, palpitations, dyspnea on exertion, peripheral edema.  Respiratory: Negative for dyspnea at rest, dyspnea on exertion, cough, sputum, wheezing.  GI: See history of present illness. GU:  Negative for dysuria, hematuria, urinary incontinence, urinary frequency, nocturnal urination.  Endo: Negative for unusual weight change.    Physical Examination:   There were no vitals taken for this visit.  General: Well-nourished, well-developed in no acute distress.  Eyes: No icterus. Conjunctivae pink. Lungs: Clear to auscultation bilaterally. Non-labored. Heart: Regular rate and rhythm, no murmurs rubs or  gallops.  Abdomen: Bowel sounds are normal, nontender, nondistended, no hepatosplenomegaly or masses, no abdominal bruits or hernia , no rebound or guarding.   Extremities: No lower extremity edema. No clubbing or deformities. Neuro: Alert and oriented x 3.  Grossly intact. Skin: Warm and dry, no jaundice.   Psych: Alert and cooperative, normal mood and affect.  Labs:  ***  Imaging Studies: No results found.  Assessment and Plan:   Derrick Galvan. is a 61 y.o. y/o male ***     Lucilla Lame, MD. [facility] Regal    Note: This dictation was  prepared with Dragon dictation along with smaller phrase technology. Any transcriptional errors that result from this process are unintentional.

## 2022-03-22 ENCOUNTER — Ambulatory Visit: Payer: 59 | Admitting: Gastroenterology

## 2022-07-13 ENCOUNTER — Ambulatory Visit (INDEPENDENT_AMBULATORY_CARE_PROVIDER_SITE_OTHER): Payer: 59 | Admitting: Nurse Practitioner

## 2022-07-14 ENCOUNTER — Ambulatory Visit: Payer: Self-pay | Admitting: *Deleted

## 2022-07-14 ENCOUNTER — Other Ambulatory Visit: Payer: Self-pay

## 2022-07-14 ENCOUNTER — Encounter: Payer: Self-pay | Admitting: Family Medicine

## 2022-07-14 ENCOUNTER — Ambulatory Visit (INDEPENDENT_AMBULATORY_CARE_PROVIDER_SITE_OTHER): Payer: 59 | Admitting: Family Medicine

## 2022-07-14 VITALS — BP 130/84 | HR 84 | Temp 98.9°F | Ht 71.0 in | Wt 256.2 lb

## 2022-07-14 DIAGNOSIS — Z125 Encounter for screening for malignant neoplasm of prostate: Secondary | ICD-10-CM

## 2022-07-14 DIAGNOSIS — J019 Acute sinusitis, unspecified: Secondary | ICD-10-CM | POA: Insufficient documentation

## 2022-07-14 MED ORDER — LEVOFLOXACIN 500 MG PO TABS: 500.0000 mg | ORAL_TABLET | Freq: Every day | ORAL | 0 refills | Status: DC

## 2022-07-14 MED ORDER — METHYLPREDNISOLONE 4 MG PO TBPK: ORAL_TABLET | ORAL | 0 refills | Status: DC

## 2022-07-14 MED ORDER — PROMETHAZINE-DM 6.25-15 MG/5ML PO SYRP: 5.0000 mL | ORAL_SOLUTION | Freq: Four times a day (QID) | ORAL | 0 refills | Status: DC | PRN

## 2022-07-14 NOTE — Assessment & Plan Note (Signed)
4- 5-day history of congestion, sinus pressure, ear fullness, progressive throat pain, postnasal drip, and cough.  Denies any overt fevers, chills, myalgias, shortness of air, change in abdominal/bowel symptoms from baseline.  Does have sick contacts reported.  Examination reveals stable low O2 sat compared to prior visits, clear lung fields throughout without wheezes, rales, rhonchi, benign cardiac sounds, no lymphadenopathy, tympanic membrane the left with fluid posteriorly, canal benign, contralateral tympanic membrane and canal benign, mild tenderness diffusely about the frontal, ethmoid, and maxillary sinus regions, erythematous and mildly swollen turbinates bilaterally, cobblestoning along the oropharynx.  Clinical history and features are most consistent with sinusitis, given his extensive allergy list, duration of symptoms, have advised initial supportive care with scheduled Flonase, Mucinex, Claritin, oral course of steroids, and as needed antitussive.  I did write for Levaquin for patient to initiate after 7+ days of symptoms if necessary.  He was also advised to contact our office if symptoms persist despite the after mentioned treatments.  At which time, x-ray and medication management to be considered.

## 2022-07-14 NOTE — Progress Notes (Signed)
     Primary Care / Sports Medicine Office Visit  Patient Information:  Patient ID: Derrick Lopezgarcia., male DOB: [birth year] Age: 61 y.o. MRN: [id number]   Derrick Millea. is a pleasant 61 y.o. male presenting with the following:  Chief Complaint  Patient presents with   Cough    Since Sunday, now coughing green phlegm. 2 at home negative covid test    Vitals:   07/14/22 1559  BP: 130/84  Pulse: 84  Temp: 98.9 F (37.2 C)  SpO2: 96%   Vitals:   07/14/22 1559  Weight: 256 lb 3.2 oz (116.2 kg)  Height: '5\' 11"'$  (1.803 m)   Body mass index is 35.73 kg/m.  No results found.   Independent interpretation of notes and tests performed by another provider:   None  Procedures performed:   None  Pertinent History, Exam, Impression, and Recommendations:   Problem List Items Addressed This Visit       Respiratory   Acute non-recurrent sinusitis - Primary    4- 5-day history of congestion, sinus pressure, ear fullness, progressive throat pain, postnasal drip, and cough.  Denies any overt fevers, chills, myalgias, shortness of air, change in abdominal/bowel symptoms from baseline.  Does have sick contacts reported.  Examination reveals stable low O2 sat compared to prior visits, clear lung fields throughout without wheezes, rales, rhonchi, benign cardiac sounds, no lymphadenopathy, tympanic membrane the left with fluid posteriorly, canal benign, contralateral tympanic membrane and canal benign, mild tenderness diffusely about the frontal, ethmoid, and maxillary sinus regions, erythematous and mildly swollen turbinates bilaterally, cobblestoning along the oropharynx.  Clinical history and features are most consistent with sinusitis, given his extensive allergy list, duration of symptoms, have advised initial supportive care with scheduled Flonase, Mucinex, Claritin, oral course of steroids, and as needed antitussive.  I did write for Levaquin for patient to initiate after 7+ days of  symptoms if necessary.  He was also advised to contact our office if symptoms persist despite the after mentioned treatments.  At which time, x-ray and medication management to be considered.      Relevant Medications   methylPREDNISolone (MEDROL DOSEPAK) 4 MG TBPK tablet   promethazine-dextromethorphan (PROMETHAZINE-DM) 6.25-15 MG/5ML syrup   levofloxacin (LEVAQUIN) 500 MG tablet     Orders & Medications Meds ordered this encounter  Medications   methylPREDNISolone (MEDROL DOSEPAK) 4 MG TBPK tablet    Sig: Take 6 tabs daily, then 5, 4, 3, 2, 1    Dispense:  21 tablet    Refill:  0   promethazine-dextromethorphan (PROMETHAZINE-DM) 6.25-15 MG/5ML syrup    Sig: Take 5 mLs by mouth 4 (four) times daily as needed for cough.    Dispense:  118 mL    Refill:  0   levofloxacin (LEVAQUIN) 500 MG tablet    Sig: Take 1 tablet (500 mg total) by mouth daily for 5 days.    Dispense:  5 tablet    Refill:  0   No orders of the defined types were placed in this encounter.    Return if symptoms worsen or fail to improve.     Montel Culver, MD   Primary Care Sports Medicine [place]

## 2022-07-14 NOTE — Patient Instructions (Signed)
-   Start Medrol Dosepak (steroids), take for full course - Use Flonase (fluticasone propionate), 2 sprays in each nostril daily x7 days then as needed - Start Mucinex (guaifenesin), 1 tablet twice daily x7 days then as needed - Start antihistamine (Claritin, Zyrtec, etc.) once daily x7 days and as needed - If symptoms persist without any improvement by Sunday/Monday, start antibiotics and take for full course, continue the above treatments for the duration of antibiotics - Continue supportive care as you have been - Contact us if symptoms persist despite adherence to the above or for any questions

## 2022-07-14 NOTE — Telephone Encounter (Signed)
  Chief Complaint: cold that has settled in chest Symptoms: productive cough, voice changed Frequency: since Monday (4 days) Pertinent Negatives: Patient denies fever Disposition: '[]'$ ED /'[]'$ Urgent Care (no appt availability in office) / '[]'$ Appointment(In office/virtual)/ '[]'$  [facility]/ '[]'$ Home Care/ '[]'$ Refused Recommended Disposition /'[]'$  Mobile Bus/ '[]'$  Follow-up with PCP Additional Notes: Pt has taken COVID test twice this week and was negative. Started in his sinuses, then cough, now he feels like it is bronchitis. Appt today, home care discussed.   Reason for Disposition  [1] Nasal discharge AND [2] present > 10 days  Answer Assessment - Initial Assessment Questions 1. ONSET: "When did the cough begin?"      Dry cough at first 4 days ago this morning productive 2. SEVERITY: "How bad is the cough today?"      Not a lot 3. SPUTUM: "Describe the color of your sputum" (none, dry cough; clear, white, yellow, green)     Green just first thing in morning 4. HEMOPTYSIS: "Are you coughing up any blood?" If so ask: "How much?" (flecks, streaks, tablespoons, etc.)     no 5. DIFFICULTY BREATHING: "Are you having difficulty breathing?" If Yes, ask: "How bad is it?" (e.g., mild, moderate, severe)    - MILD: No SOB at rest, mild SOB with walking, speaks normally in sentences, can lie down, no retractions, pulse < 100.    - MODERATE: SOB at rest, SOB with minimal exertion and prefers to sit, cannot lie down flat, speaks in phrases, mild retractions, audible wheezing, pulse 100-120.    - SEVERE: Very SOB at rest, speaks in single words, struggling to breathe, sitting hunched forward, retractions, pulse > 120      no 6. FEVER: "Do you have a fever?" If Yes, ask: "What is your temperature, how was it measured, and when did it start?"     Not now 7. CARDIAC HISTORY: "Do you have any history of heart disease?" (e.g., heart attack, congestive heart failure)      no 8. LUNG HISTORY: "Do  you have any history of lung disease?"  (e.g., pulmonary embolus, asthma, emphysema)    Bacterial  pna in hx 9. PE RISK FACTORS: "Do you have a history of blood clots?" (or: recent major surgery, recent prolonged travel, bedridden)     DVT left leg hx 10. OTHER SYMPTOMS: "Do you have any other symptoms?" (e.g., runny nose, wheezing, chest pain)       no 11. PREGNANCY: "Is there any chance you are pregnant?" "When was your last menstrual period?"       no 12. TRAVEL: "Have you traveled out of the country in the last month?" (e.g., travel history, exposures)       no  Protocols used: Cough - Acute Productive-A-AH

## 2022-07-18 ENCOUNTER — Telehealth: Payer: Self-pay | Admitting: Internal Medicine

## 2022-07-18 NOTE — Telephone Encounter (Signed)
We do not have any availability today. Pt informed per front desk. Pt moved to Dr Gaspar Cola scheduled tomorrow at 1120 AM.

## 2022-07-18 NOTE — Telephone Encounter (Signed)
Copied from [place] [id number]. Topic: General - Other >> Jul 18, 2022  8:24 AM Leilani Able wrote: Reason for CRM: Pt saw DR Zigmund Daniel Thursday, with congestion in chest, etc, was given medication but told to hold off with history of antibiotics, Dr is Dr B, called office appt made with Dr Lenna Sciara instead? Pt was told by Dr Zigmund Daniel to call back today as he thought was going into chest, pt said that the right side of his chest is feeling like it as settled into chest, states is worse but as not started med as fears the reaction from antibiotic so not taking, pt was told if worse x-rays would be done today. please have nurse fu as pt worse and not taking the med. [phone]

## 2022-07-19 ENCOUNTER — Ambulatory Visit (INDEPENDENT_AMBULATORY_CARE_PROVIDER_SITE_OTHER): Payer: 59 | Admitting: Internal Medicine

## 2022-07-19 ENCOUNTER — Encounter: Payer: Self-pay | Admitting: Internal Medicine

## 2022-07-19 VITALS — BP 124/70 | HR 71 | Temp 98.2°F | Ht 71.0 in | Wt 256.0 lb

## 2022-07-19 DIAGNOSIS — J069 Acute upper respiratory infection, unspecified: Secondary | ICD-10-CM

## 2022-07-19 DIAGNOSIS — R0602 Shortness of breath: Secondary | ICD-10-CM

## 2022-07-19 MED ORDER — ALBUTEROL SULFATE HFA 108 (90 BASE) MCG/ACT IN AERS: 2.0000 | INHALATION_SPRAY | Freq: Four times a day (QID) | RESPIRATORY_TRACT | 0 refills | Status: DC | PRN

## 2022-07-19 NOTE — Progress Notes (Signed)
Date:  07/19/2022   Name:  Derrick Galvan.   DOB:  1961/01/31   MRN:  [id number]   Chief Complaint: Cough  Cough This is a recurrent problem. The current episode started in the past 7 days. The problem has been waxing and waning. The cough is Productive of sputum. Associated symptoms include nasal congestion, postnasal drip, shortness of breath, sweats and wheezing. Pertinent negatives include no chest pain, chills or fever. The symptoms are aggravated by lying down.  Started a week ago - was bringing up green phlegm.  No fever but having some sweats.  Cough is the most bothersome.  He started on steroid taper and cough medications 5 days ago.  He did not take the levaquin due to stomach upset concerns.  Currently the sputum is white but coughing constantly.  Mild shortness of breath with cough but no prolonged wheezing.  Lab Results  Component Value Date   NA 142 11/16/2021   K 4.8 11/16/2021   CO2 27 11/16/2021   GLUCOSE 71 11/16/2021   BUN 21 11/16/2021   CREATININE 1.12 11/16/2021   CALCIUM 9.8 11/16/2021   EGFR 75 11/16/2021   GFRNONAA 67 09/02/2020   Lab Results  Component Value Date   CHOL 187 11/16/2021   HDL 48 11/16/2021   LDLCALC 126 (H) 11/16/2021   TRIG 70 11/16/2021   CHOLHDL 3.9 11/16/2021   Lab Results  Component Value Date   TSH 0.690 11/16/2021   No results found for: "HGBA1C" Lab Results  Component Value Date   WBC 7.6 11/16/2021   HGB 16.9 11/16/2021   HCT 49.2 11/16/2021   MCV 91 11/16/2021   PLT 147 (L) 11/16/2021   Lab Results  Component Value Date   ALT 21 11/16/2021   AST 24 11/16/2021   ALKPHOS 83 11/16/2021   BILITOT 1.7 (H) 11/16/2021   No results found for: "25OHVITD2", "25OHVITD3", "VD25OH"   Review of Systems  Constitutional:  Positive for diaphoresis and fatigue. Negative for chills and fever.  HENT:  Positive for postnasal drip.   Respiratory:  Positive for cough, shortness of breath and wheezing.   Cardiovascular:   Negative for chest pain and palpitations.  Psychiatric/Behavioral:  Positive for sleep disturbance. Negative for dysphoric mood. The patient is not nervous/anxious.     Patient Active Problem List   Diagnosis Date Noted   Acute non-recurrent sinusitis 07/14/2022   LBBB (left bundle branch block) 01/11/2022   Hyperlipidemia, mixed 01/11/2022   Lymphedema 08/24/2021   Post-phlebitic syndrome 08/24/2021   Gastroesophageal reflux disease without esophagitis    Personal history of colonic polyps    Current moderate episode of major depressive disorder without prior episode ([place]) 09/02/2020   Varicose veins of right lower extremity with complications 07/30/3006   Generalized anxiety disorder 08/22/2019   SCCA (squamous cell carcinoma) of skin 02/01/2019   Sleep disturbance 12/18/2018   Benign neoplasm of ascending colon    Adaptive colitis 09/19/2013    Allergies  Allergen Reactions   Azithromycin Hives   Amoxicillin    Clindamycin/Lincomycin Diarrhea   Doxycycline Hives and Diarrhea   Keflex [Cephalexin] Swelling    Past Surgical History:  Procedure Laterality Date   COLONOSCOPY  02/15/11   repeat 10 years   COLONOSCOPY WITH PROPOFOL N/A 08/15/2016   Procedure: COLONOSCOPY WITH PROPOFOL;  Surgeon: Lucilla Lame, MD;  Location: [facility];  Service: Endoscopy;  Laterality: N/A;   COLONOSCOPY WITH PROPOFOL N/A 09/14/2020   Procedure: COLONOSCOPY WITH  BIOPSY;  Surgeon: Lucilla Lame, MD;  Location: [facility];  Service: Endoscopy;  Laterality: N/A;   ESOPHAGOGASTRODUODENOSCOPY (EGD) WITH PROPOFOL N/A 08/15/2016   Procedure: ESOPHAGOGASTRODUODENOSCOPY (EGD) WITH PROPOFOL;  Surgeon: Lucilla Lame, MD;  Location: [facility];  Service: Endoscopy;  Laterality: N/A;   ESOPHAGOGASTRODUODENOSCOPY (EGD) WITH PROPOFOL N/A 09/14/2020   Procedure: ESOPHAGOGASTRODUODENOSCOPY (EGD) WITH PROPOFOL;  Surgeon: Lucilla Lame, MD;  Location: [facility];  Service: Endoscopy;   Laterality: N/A;   POLYPECTOMY  08/15/2016   Procedure: POLYPECTOMY;  Surgeon: Lucilla Lame, MD;  Location: [facility];  Service: Endoscopy;;   POLYPECTOMY N/A 09/14/2020   Procedure: POLYPECTOMY;  Surgeon: Lucilla Lame, MD;  Location: [facility];  Service: Endoscopy;  Laterality: N/A;    Social History   Tobacco Use   Smoking status: Never   Smokeless tobacco: Never  Vaping Use   Vaping Use: Never used  Substance Use Topics   Alcohol use: Yes    Alcohol/week: 0.0 standard drinks of alcohol    Comment: occasional   Drug use: No     Medication list has been reviewed and updated.  Current Meds  Medication Sig   acetaminophen (TYLENOL) 500 MG tablet Take 1,000 mg by mouth every 6 (six) hours as needed.   ALPRAZolam (XANAX) 0.5 MG tablet Take 1 tablet by mouth as needed.   ASHWAGANDHA PO Take by mouth.   bismuth subsalicylate (PEPTO BISMOL) 262 MG/15ML suspension Take 30 mLs by mouth every 6 (six) hours as needed. OTC   ciclopirox (PENLAC) 8 % solution Apply topically.   dicyclomine (BENTYL) 20 MG tablet TAKE (1) TABLET BY MOUTH THREE TIMES A DAY BEFORE MEALS.   diphenoxylate-atropine (LOMOTIL) 2.5-0.025 MG tablet TAKE (1) TABLET BY MOUTH FOUR TIMES A DAY AS NEEDED FOR DIARRHEA   famotidine (PEPCID) 10 MG tablet Take 10 mg by mouth 2 (two) times daily. OTC   ibuprofen (ADVIL) 600 MG tablet Take 1 tablet (600 mg total) by mouth every 8 (eight) hours as needed.   Loperamide HCl (IMODIUM PO) Take by mouth as needed.   methylPREDNISolone (MEDROL DOSEPAK) 4 MG TBPK tablet Take 6 tabs daily, then 5, 4, 3, 2, 1   Multiple Vitamin (MULTI-VITAMIN PO) Take by mouth daily.   Probiotic Product (PROBIOTIC PO) Take by mouth.   promethazine-dextromethorphan (PROMETHAZINE-DM) 6.25-15 MG/5ML syrup Take 5 mLs by mouth 4 (four) times daily as needed for cough.   triamcinolone cream (KENALOG) 0.1 % Apply 1 application topically as needed. HIVES from Azithromy reaction.   UNABLE TO  FIND Med Name: Red Algae       07/19/2022   11:34 AM 07/14/2022    4:46 PM 02/23/2022    2:04 PM 01/12/2022    1:58 PM  GAD 7 : Generalized Anxiety Score  Nervous, Anxious, on Edge _0 Control/stop worrying 0 _1 Worry too much - different things _2 Trouble relaxing 0 _3 Restless 0 0 0 0  Easily annoyed or irritable _4 Afraid - awful might happen _5 Total GAD 7 Score _6 Anxiety Difficulty Not difficult at all Not difficult at all Not difficult at all        07/19/2022   11:33 AM 07/14/2022    4:46 PM 02/23/2022    2:04 PM  Depression screen PHQ 2/9  Decreased Interest _7 Down, Depressed, Hopeless 1  1 1  PHQ - 2 Score _0 Altered sleeping _1 Tired, decreased energy _2 Change in appetite _3 Feeling bad or failure about yourself  2 0 0  Trouble concentrating 0 0 0  Moving slowly or fidgety/restless 0 0 0  Suicidal thoughts 0 0 0  PHQ-9 Score _4 Difficult doing work/chores Not difficult at all Not difficult at all Somewhat difficult    BP Readings from Last 3 Encounters:  07/19/22 124/70  07/14/22 130/84  02/23/22 120/70    Physical Exam Vitals and nursing note reviewed.  Constitutional:      General: He is not in acute distress.    Appearance: Normal appearance. He is well-developed.  HENT:     Head: Normocephalic and atraumatic.  Cardiovascular:     Rate and Rhythm: Normal rate and regular rhythm.  Pulmonary:     Effort: Pulmonary effort is normal. No accessory muscle usage, prolonged expiration or respiratory distress.     Breath sounds: Transmitted upper airway sounds present. No stridor. No wheezing or rhonchi.  Musculoskeletal:     Cervical back: Normal range of motion.  Lymphadenopathy:     Cervical: No cervical adenopathy.  Skin:    General: Skin is warm and dry.     Findings: No rash.  Neurological:     Mental Status: He is alert and oriented to person, place, and time.  Psychiatric:         Mood and Affect: Mood normal.        Behavior: Behavior normal.     Wt Readings from Last 3 Encounters:  07/19/22 256 lb (116.1 kg)  07/14/22 256 lb 3.2 oz (116.2 kg)  02/23/22 251 lb 6.4 oz (114 kg)    BP 124/70   Pulse 71   Temp 98.2 F (36.8 C) (Oral)   Ht _5  (1.803 m)   Wt 256 lb (116.1 kg)   SpO2 97%   BMI 35.70 kg/m   Assessment and Plan: 1. Upper respiratory tract infection, unspecified type Clinically improving by report Exam is reassuring. Finish prednisone tape; continue fluids, cough medications Begin Levaquin if sputum become discolored again  2. Shortness of breath Use albuterol MDI qid if needed for chest tightness - albuterol (VENTOLIN HFA) 108 (90 Base) MCG/ACT inhaler; Inhale 2 puffs into the lungs every 6 (six) hours as needed for wheezing or shortness of breath.  Dispense: 1 each; Refill: 0   Partially dictated using Editor, commissioning. Any errors are unintentional.  Halina Maidens, MD [facility]  07/19/2022

## 2022-07-27 ENCOUNTER — Other Ambulatory Visit
Admission: RE | Admit: 2022-07-27 | Discharge: 2022-07-27 | Disposition: A | Discharge status: Home / Self Care | Payer: 59 | Attending: Urology | Admitting: Urology

## 2022-07-27 ENCOUNTER — Ambulatory Visit (INDEPENDENT_AMBULATORY_CARE_PROVIDER_SITE_OTHER): Payer: 59 | Admitting: Urology

## 2022-07-27 ENCOUNTER — Ambulatory Visit: Payer: 59 | Admitting: Urology

## 2022-07-27 ENCOUNTER — Encounter: Payer: Self-pay | Admitting: Urology

## 2022-07-27 VITALS — BP 123/81 | HR 80 | Ht 71.0 in | Wt 256.0 lb

## 2022-07-27 DIAGNOSIS — R351 Nocturia: Secondary | ICD-10-CM

## 2022-07-27 DIAGNOSIS — N3941 Urge incontinence: Secondary | ICD-10-CM | POA: Diagnosis not present

## 2022-07-27 DIAGNOSIS — Z125 Encounter for screening for malignant neoplasm of prostate: Secondary | ICD-10-CM

## 2022-07-27 DIAGNOSIS — R3915 Urgency of urination: Secondary | ICD-10-CM | POA: Diagnosis not present

## 2022-07-27 DIAGNOSIS — R399 Unspecified symptoms and signs involving the genitourinary system: Secondary | ICD-10-CM

## 2022-07-27 NOTE — Patient Instructions (Addendum)
Prostate Cancer Screening  Prostate cancer screening is testing that is done to check for the presence of prostate cancer in men. The prostate gland is a walnut-sized gland that is located below the bladder and in front of the rectum in males. The function of the prostate is to add fluid to semen during ejaculation. Prostate cancer is one of the most common types of cancer in men. Who should have prostate cancer screening? Screening recommendations vary based on age and other risk factors, as well as between the professional organizations who make the recommendations. In general, screening is recommended if: You are age 12 to 15 and have an average risk for prostate cancer. You should talk with your health care provider about your need for screening and how often screening should be done. Because most prostate cancers are slow growing and will not cause death, screening in this age group is generally reserved for men who have a 91- to 15-year life expectancy. You are younger than age 30, and you have these risk factors: Having a father, brother, or uncle who has been diagnosed with prostate cancer. The risk is higher if your family member's cancer occurred at an early age or if you have multiple family members with prostate cancer at an early age. Being a male who is [place] or is of [place] or sub-Saharan African descent. In general, screening is not recommended if: You are younger than age 21. You are between the ages of 34 and 80 and you have no risk factors. You are 94 years of age or older. At this age, the risks that screening can cause are greater than the benefits that it may provide. If you are at high risk for prostate cancer, your health care provider may recommend that you have screenings more often or that you start screening at a younger age. How is screening for prostate cancer done? The recommended prostate cancer screening test is a blood test called the prostate-specific antigen  (PSA) test. PSA is a protein that is made in the prostate. As you age, your prostate naturally produces more PSA. Abnormally high PSA levels may be caused by: Prostate cancer. An enlarged prostate that is not caused by cancer (benign prostatic hyperplasia, or BPH). This condition is very common in older men. A prostate gland infection (prostatitis) or urinary tract infection. Certain medicines such as male hormones (like testosterone) or other medicines that raise testosterone levels. A rectal exam may be done as part of prostate cancer screening to help provide information about the size of your prostate gland. When a rectal exam is performed, it should be done after the PSA level is drawn to avoid any effect on the results. Depending on the PSA results, you may need more tests, such as: A physical exam to check the size of your prostate gland, if not done as part of screening. Blood and imaging tests. A procedure to remove tissue samples from your prostate gland for testing (biopsy). This is the only way to know for certain if you have prostate cancer. What are the benefits of prostate cancer screening? Screening can help to identify cancer at an early stage, before symptoms start and when the cancer can be treated more easily. There is a small chance that screening may lower your risk of dying from prostate cancer. The chance is small because prostate cancer is a slow-growing cancer, and most men with prostate cancer die from a different cause. What are the risks of prostate cancer screening? The  main risk of prostate cancer screening is diagnosing and treating prostate cancer that would never have caused any symptoms or problems. This is called overdiagnosisand overtreatment. PSA screening cannot tell you if your PSA is high due to cancer or a different cause. A prostate biopsy is the only procedure to diagnose prostate cancer. Even the results of a biopsy may not tell you if your cancer needs to  be treated. Slow-growing prostate cancer may not need any treatment other than monitoring, so diagnosing and treating it may cause unnecessary stress or other side effects. Questions to ask your health care provider When should I start prostate cancer screening? What is my risk for prostate cancer? How often do I need screening? What type of screening tests do I need? How do I get my test results? What do my results mean? Do I need treatment? Where to find more information The American Cancer Society: www.cancer.org American Urological Association: www.auanet.org Contact a health care provider if: You have difficulty urinating. You have pain when you urinate or ejaculate. You have blood in your urine or semen. You have pain in your back or in the area of your prostate. Summary Prostate cancer is a common type of cancer in men. The prostate gland is located below the bladder and in front of the rectum. This gland adds fluid to semen during ejaculation. Prostate cancer screening may identify cancer at an early stage, when the cancer can be treated more easily and is less likely to have spread to other areas of the body. The prostate-specific antigen (PSA) test is the recommended screening test for prostate cancer, but it has associated risks. Discuss the risks and benefits of prostate cancer screening with your health care provider. If you are age 4 or older, the risks that screening can cause are greater than the benefits that it may provide. This information is not intended to replace advice given to you by your health care provider. Make sure you discuss any questions you have with your health care provider. Document Revised: 04/19/2021 Document Reviewed: 04/19/2021 Elsevier Patient Education  [place] of a Plant-Based Diet for Urology Health  A plant-based diet emphasizes the consumption of whole, unprocessed plant foods while minimizing or excluding animal  products including meat and dairy products. This dietary approach has gained attention for its potential to promote overall health, including urology-related conditions. Incorporating a plant-based diet into your lifestyle can offer numerous benefits for maintaining optimal urology health.  1. Reduced Risk of Kidney Stones: A plant-based diet is typically rich in fruits, vegetables, legumes, and whole grains. These foods are high in dietary fiber, potassium, and magnesium, which can help reduce the risk of developing kidney stones. Be careful to avoid high quantities of spinach, as these can contribute to kidney stone formation if eaten in large volumes. The increased intake of water-soluble fiber can enhance the excretion of waste products and prevent the crystallization of minerals that lead to stone formation.  2. Improved Prostate Health: Studies have suggested a link between the consumption of red and processed meats and an increased risk of prostate problems, including benign prostatic hyperplasia (BPH) and prostate cancer. By adopting a plant-based diet, you can lower your intake of saturated fats and decrease the risk of these conditions. PSA levels can often decrease on plant based diets! Plant foods are also rich in antioxidants and phytochemicals that have been associated with prostate health.  3. Better Bladder Function: A diet focused on plant-based foods  can contribute to better bladder health by reducing the risk of urinary tract infections (UTIs). Berries, citrus fruits, and leafy greens are known for their high vitamin C content, which can acidify urine and create an environment less favorable for bacteria growth. Additionally, plant-based diets are generally lower in sodium, which can help prevent fluid retention and reduce the strain on the bladder.  4. Management of Erectile Dysfunction (ED): Some research suggests that a plant-based diet can positively impact erectile function.  Plant-based diets are associated with improved cardiovascular health, which is crucial for maintaining healthy blood flow and nerve function required for proper erectile function. By reducing the consumption of high-cholesterol and high-saturated fat animal products, a plant-based diet may contribute to a decreased risk of ED.  5. Prevention of Chronic Conditions: A plant-based diet can help prevent or manage chronic conditions such as obesity, diabetes, and hypertension. These conditions can contribute to urology-related issues, including urinary incontinence and kidney dysfunction. By maintaining a healthy weight and managing these conditions, you can reduce the risk of urology-related complications.  Conclusion: Embracing a plant-based diet can offer significant benefits for urology health. By incorporating a variety of colorful fruits, vegetables, whole grains, nuts, seeds, and legumes into your meals, you can support kidney health, prostate health, bladder function, and overall well-being. Remember to consult with a healthcare professional or registered dietitian before making any significant dietary changes, especially if you have existing health conditions. Your personalized approach to a plant-based diet can contribute to improved urology health and enhance your quality of life.      Documentaries with benefits of plant based diet: "[employer]," and "Eating you Alive"

## 2022-07-27 NOTE — Progress Notes (Signed)
07/27/22 10:45 AM   Rich Number. [id number]  CC: PSA screening, lower urinary tract symptoms  HPI: I saw Mr. Dunivan today for the above issues.  He has a number of questions today about PSA screening.  He also has some mild urinary symptoms of some urgency, rare urge incontinence, nocturia 1-2 times at night.  He drinks water, green tea, and coffee during the day.  No family history of prostate or breast cancer.  No dysuria or gross hematuria.  No problems with erections.  He has never been evaluated for sleep apnea.  Most recent PSA from October 2021 was normal at 1.1.   PMH: Past Medical History:  Diagnosis Date   Achilles tendinitis 06/16/2020   Acute gastritis without hemorrhage    Adaptive colitis 09/19/2013   Arthritis    knees   Chronic diarrhea 07/31/2013   DVT (deep venous thrombosis) ([place]) 12/21/2018   LLL   GERD (gastroesophageal reflux disease)    Hepatitis B 1989   resolved   Skin cancer    on face - removed under local    Surgical History: Past Surgical History:  Procedure Laterality Date   COLONOSCOPY  02/15/11   repeat 10 years   COLONOSCOPY WITH PROPOFOL N/A 08/15/2016   Procedure: COLONOSCOPY WITH PROPOFOL;  Surgeon: Lucilla Lame, MD;  Location: [place];  Service: Endoscopy;  Laterality: N/A;   COLONOSCOPY WITH PROPOFOL N/A 09/14/2020   Procedure: COLONOSCOPY WITH BIOPSY;  Surgeon: Lucilla Lame, MD;  Location: [place];  Service: Endoscopy;  Laterality: N/A;   ESOPHAGOGASTRODUODENOSCOPY (EGD) WITH PROPOFOL N/A 08/15/2016   Procedure: ESOPHAGOGASTRODUODENOSCOPY (EGD) WITH PROPOFOL;  Surgeon: Lucilla Lame, MD;  Location: [place];  Service: Endoscopy;  Laterality: N/A;   ESOPHAGOGASTRODUODENOSCOPY (EGD) WITH PROPOFOL N/A 09/14/2020   Procedure: ESOPHAGOGASTRODUODENOSCOPY (EGD) WITH PROPOFOL;  Surgeon: Lucilla Lame, MD;  Location: [place];  Service: Endoscopy;  Laterality: N/A;   POLYPECTOMY  08/15/2016    Procedure: POLYPECTOMY;  Surgeon: Lucilla Lame, MD;  Location: [place];  Service: Endoscopy;;   POLYPECTOMY N/A 09/14/2020   Procedure: POLYPECTOMY;  Surgeon: Lucilla Lame, MD;  Location: [place];  Service: Endoscopy;  Laterality: N/A;    Family History: Family History  Problem Relation Age of Onset   Healthy Mother    COPD Father     Social History:  reports that he has never smoked. He has never been exposed to tobacco smoke. He has never used smokeless tobacco. He reports current alcohol use. He reports that he does not use drugs.  Physical Exam: BP 123/81   Pulse 80   Ht '5\' 11"'$  (1.803 m)   Wt 256 lb (116.1 kg)   BMI 35.70 kg/m    Constitutional:  Alert and oriented, No acute distress. Cardiovascular: No clubbing, cyanosis, or edema. Respiratory: Normal respiratory effort, no increased work of breathing. GI: Abdomen is soft, nontender, nondistended, no abdominal masses DRE: 30 g, smooth, no nodules or masses  Laboratory Data: Reviewed  Assessment & Plan:   61 year old male with mild urinary symptoms of urgency, rare urge incontinence, nocturia 1-2 times at night, PSA has been normal in the past, DRE benign today.  I recommended starting with behavioral strategies and we focused on diet and avoiding bladder irritants, as well as timed voiding.  Will check PSA today, and we reviewed the AUA guidelines that recommend screening every other year ages 62 through 20, and risks and benefits of screening were discussed.  Call  with PSA results RTC 1 year symptom check, sooner if worsening urinary symptoms  I spent 45 total minutes on the day of the encounter including pre-visit review of the medical record, face-to-face time with the patient, and post visit ordering of labs/imaging/tests.  Nickolas Madrid, MD 07/27/2022  [facility] [address], [place], [place] [address] [phone]

## 2022-07-29 LAB — PSA (REFLEX TO FREE) (SERIAL): Prostate Specific Ag, Serum: 1.4 ng/mL (ref 0.0–4.0)

## 2022-08-02 ENCOUNTER — Ambulatory Visit (INDEPENDENT_AMBULATORY_CARE_PROVIDER_SITE_OTHER): Payer: 59 | Admitting: Nurse Practitioner

## 2022-08-02 ENCOUNTER — Encounter (INDEPENDENT_AMBULATORY_CARE_PROVIDER_SITE_OTHER): Payer: Self-pay | Admitting: Nurse Practitioner

## 2022-08-02 VITALS — BP 123/76 | HR 76 | Resp 16 | Wt 236.2 lb

## 2022-08-02 DIAGNOSIS — I890 Lymphedema, not elsewhere classified: Secondary | ICD-10-CM

## 2022-08-02 DIAGNOSIS — R209 Unspecified disturbances of skin sensation: Secondary | ICD-10-CM | POA: Diagnosis not present

## 2022-08-02 DIAGNOSIS — E782 Mixed hyperlipidemia: Secondary | ICD-10-CM

## 2022-08-02 NOTE — Progress Notes (Signed)
Subjective:    Patient ID: Derrick Galvan., male    DOB: August 05, 1961, 61 y.o.   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  However the patient is no longer on anticoagulation.  He does note continued swelling in his left lower extremity but it is fairly well controlled.  He notes that he is diligent with use of his conservative therapy.  Largest concern for the patient currently is the fact that at night his left leg gets cold.  He sleeps with the foot on a raised wedge and he notes that the foot becomes cold.  He also notes having cramps in the lower extremities that have been essentially nightly.  He denies open wounds or ulcerations.  He does have some degenerative disc disease in his back.  He also has some other musculoskeletal issues such as degenerative changes within the knees.    Review of Systems  Cardiovascular:  Positive for leg swelling.  All other systems reviewed and are negative.      Objective:   Physical Exam Vitals reviewed.  HENT:     Head: Normocephalic.  Cardiovascular:     Rate and Rhythm: Normal rate.     Pulses:          Dorsalis pedis pulses are 1+ on the right side and 1+ on the left side.       Posterior tibial pulses are 0 on the right side and 0 on the left side.  Pulmonary:     Effort: Pulmonary effort is normal.  Musculoskeletal:     Left lower leg: 2+ Edema present.  Skin:    General: Skin is dry.     Capillary Refill: Capillary refill takes 2 to 3 seconds.     Comments: Stasis dermatitis left lower extremity  Neurological:     Mental Status: He is alert and oriented to person, place, and time.  Psychiatric:        Mood and Affect: Mood normal.        Behavior: Behavior normal.        Thought Content: Thought content normal.        Judgment: Judgment normal.      BP 123/76 (BP Location: Right Arm)   Pulse 76   Resp 16   Wt 236 lb 3.2 oz (107.1 kg)   BMI 32.94 kg/m   Past Medical History:  Diagnosis Date   Achilles tendinitis 06/16/2020   Acute gastritis without hemorrhage    Adaptive colitis 09/19/2013   Arthritis    knees   Chronic diarrhea 07/31/2013   DVT (deep venous thrombosis) ([place]) 12/21/2018   LLL   GERD (gastroesophageal reflux disease)    Hepatitis B 1989   resolved   Skin cancer    on face - removed under local    Social History   Socioeconomic History   Marital status: Single    Spouse name: Not on file   Galvan of children: 0   Years of education: Not on file   Highest education level: Not on file  Occupational History   Occupation: Freight forwarder  Tobacco Use   Smoking status: Never    Passive exposure: Never   Smokeless tobacco: Never  Vaping Use   Vaping Use: Never used  Substance and Sexual Activity   Alcohol use:  Yes    Alcohol/week: 0.0 standard drinks of alcohol    Comment: occasional   Drug use: No   Sexual activity: Yes  Other Topics Concern   Not on file  Social History Narrative   Not on file   Social Determinants of Health   Financial Resource Strain: Not on file  Food Insecurity: Not on file  Transportation Needs: Not on file  Physical Activity: Not on file  Stress: Not on file  Social Connections: Not on file  Intimate Partner Violence: Not on file    Past Surgical History:  Procedure Laterality Date   COLONOSCOPY  02/15/11   repeat 10 years   COLONOSCOPY WITH PROPOFOL N/A 08/15/2016   Procedure: COLONOSCOPY WITH PROPOFOL;  Surgeon: Lucilla Lame, MD;  Location: [place];  Service: Endoscopy;  Laterality: N/A;   COLONOSCOPY WITH PROPOFOL N/A 09/14/2020   Procedure: COLONOSCOPY WITH BIOPSY;  Surgeon: Lucilla Lame, MD;  Location: [place];  Service: Endoscopy;  Laterality: N/A;   ESOPHAGOGASTRODUODENOSCOPY (EGD) WITH PROPOFOL N/A 08/15/2016   Procedure:  ESOPHAGOGASTRODUODENOSCOPY (EGD) WITH PROPOFOL;  Surgeon: Lucilla Lame, MD;  Location: [place];  Service: Endoscopy;  Laterality: N/A;   ESOPHAGOGASTRODUODENOSCOPY (EGD) WITH PROPOFOL N/A 09/14/2020   Procedure: ESOPHAGOGASTRODUODENOSCOPY (EGD) WITH PROPOFOL;  Surgeon: Lucilla Lame, MD;  Location: [place];  Service: Endoscopy;  Laterality: N/A;   POLYPECTOMY  08/15/2016   Procedure: POLYPECTOMY;  Surgeon: Lucilla Lame, MD;  Location: [place];  Service: Endoscopy;;   POLYPECTOMY N/A 09/14/2020   Procedure: POLYPECTOMY;  Surgeon: Lucilla Lame, MD;  Location: [place];  Service: Endoscopy;  Laterality: N/A;    Family History  Problem Relation Age of Onset   Healthy Mother    COPD Father     Allergies  Allergen Reactions   Azithromycin Hives   Amoxicillin    Clindamycin/Lincomycin Diarrhea   Doxycycline Hives and Diarrhea   Keflex [Cephalexin] Swelling       Latest Ref Rng & Units 11/16/2021   11:04 AM 09/02/2020    9:05 AM 08/22/2019    9:07 AM  CBC  WBC 3.4 - 10.8 x10E3/uL 7.6  7.8  7.0   Hemoglobin 13.0 - 17.7 g/dL 16.9  17.2  17.1   Hematocrit 37.5 - 51.0 % 49.2  50.2  49.3   Platelets 150 - 450 x10E3/uL 147  173  150       CMP     Component Value Date/Time   NA 142 11/16/2021 1104   K 4.8 11/16/2021 1104   CL 104 11/16/2021 1104   CO2 27 11/16/2021 1104   GLUCOSE 71 11/16/2021 1104   BUN 21 11/16/2021 1104   CREATININE 1.12 11/16/2021 1104   CALCIUM 9.8 11/16/2021 1104   PROT 6.5 11/16/2021 1104   ALBUMIN 4.4 11/16/2021 1104   AST 24 11/16/2021 1104   ALT 21 11/16/2021 1104   ALKPHOS 83 11/16/2021 1104   BILITOT 1.7 (H) 11/16/2021 1104   GFRNONAA 67 09/02/2020 0905   GFRAA 77 09/02/2020 0905     No results found.     Assessment & Plan:   1. Lymphedema Currently the patient's lymphedema is under good control.  We discussed that with lymphedema the progression is natural that there will be some swelling but  overall controlled with the long-term desired effect.  Patient is advised to continue with the use of his compression sleeve as well as to continue with elevation and activity.  2. Hyperlipidemia, mixed Continue statin as  ordered and reviewed, no changes at this time   3. Sensation of cold in leg The patient does have a sensation of coolness in his left leg when he sleeps during the evening.  He also has consistent cramping.  Physical exam does show that he has diminished pulses with a decreased capillary refill.  We will out of an abundance of caution have the patient return for noninvasive studies to determine if there is any arterial disease that could be accounting for his symptoms.   Current Outpatient Medications on File Prior to Visit  Medication Sig Dispense Refill   acetaminophen (TYLENOL) 500 MG tablet Take 1,000 mg by mouth every 6 (six) hours as needed.     albuterol (VENTOLIN HFA) 108 (90 Base) MCG/ACT inhaler Inhale 2 puffs into the lungs every 6 (six) hours as needed for wheezing or shortness of breath. 1 each 0   ALPRAZolam (XANAX) 0.5 MG tablet Take 1 tablet by mouth as needed.     ASHWAGANDHA PO Take by mouth.     bismuth subsalicylate (PEPTO BISMOL) 262 MG/15ML suspension Take 30 mLs by mouth every 6 (six) hours as needed. OTC     dicyclomine (BENTYL) 20 MG tablet TAKE (1) TABLET BY MOUTH THREE TIMES A DAY BEFORE MEALS. 90 tablet 3   diphenoxylate-atropine (LOMOTIL) 2.5-0.025 MG tablet TAKE (1) TABLET BY MOUTH FOUR TIMES A DAY AS NEEDED FOR DIARRHEA 120 tablet 5   famotidine (PEPCID) 10 MG tablet Take 10 mg by mouth 2 (two) times daily. OTC     ibuprofen (ADVIL) 600 MG tablet Take 1 tablet (600 mg total) by mouth every 8 (eight) hours as needed. 100 tablet 5   Multiple Vitamin (MULTI-VITAMIN PO) Take by mouth daily.     Probiotic Product (PROBIOTIC PO) Take by mouth.     promethazine-dextromethorphan (PROMETHAZINE-DM) 6.25-15 MG/5ML syrup Take 5 mLs by mouth 4 (four) times  daily as needed for cough. 118 mL 0   triamcinolone cream (KENALOG) 0.1 % Apply 1 application topically as needed. HIVES from Azithromy reaction.     UNABLE TO FIND Med Name: Red Algae     No current facility-administered medications on file prior to visit.    There are no Patient Instructions on file for this visit. No follow-ups on file.   Kris Hartmann, NP

## 2022-08-29 ENCOUNTER — Other Ambulatory Visit (INDEPENDENT_AMBULATORY_CARE_PROVIDER_SITE_OTHER): Payer: Self-pay | Admitting: Nurse Practitioner

## 2022-08-29 DIAGNOSIS — R209 Unspecified disturbances of skin sensation: Secondary | ICD-10-CM

## 2022-08-30 ENCOUNTER — Encounter (INDEPENDENT_AMBULATORY_CARE_PROVIDER_SITE_OTHER): Payer: Self-pay | Admitting: Vascular Surgery

## 2022-08-30 ENCOUNTER — Ambulatory Visit (INDEPENDENT_AMBULATORY_CARE_PROVIDER_SITE_OTHER): Payer: 59

## 2022-08-30 ENCOUNTER — Ambulatory Visit (INDEPENDENT_AMBULATORY_CARE_PROVIDER_SITE_OTHER): Payer: 59 | Admitting: Vascular Surgery

## 2022-08-30 VITALS — BP 136/89 | HR 70 | Resp 16 | Wt 257.0 lb

## 2022-08-30 DIAGNOSIS — I890 Lymphedema, not elsewhere classified: Secondary | ICD-10-CM | POA: Diagnosis not present

## 2022-08-30 DIAGNOSIS — R231 Pallor: Secondary | ICD-10-CM | POA: Diagnosis not present

## 2022-08-30 DIAGNOSIS — I87009 Postthrombotic syndrome without complications of unspecified extremity: Secondary | ICD-10-CM | POA: Diagnosis not present

## 2022-08-30 DIAGNOSIS — R209 Unspecified disturbances of skin sensation: Secondary | ICD-10-CM | POA: Diagnosis not present

## 2022-08-30 NOTE — Assessment & Plan Note (Signed)
Swelling is under much better control with compression, elevation, and the pump.  Follow-up in 1 year.

## 2022-08-30 NOTE — Assessment & Plan Note (Signed)
ABIs were checked today and were normal.  No lower extremity arterial insufficiency present.

## 2022-08-30 NOTE — Progress Notes (Signed)
MRN : [id number]  Derrick Galvan. is a 61 y.o. ([birth year]) male who presents with chief complaint of  Chief Complaint  Patient presents with   Follow-up    Ultrasound follow up  .  History of Present Illness: Patient returns today in follow up with arterial studies after his recent visit.  His swelling remains under pretty good control.  He has skin about the stasis dermatitis changes that are present and really not changed and we discussed that that is unlikely to get a whole lot better but hopefully it will not continue to progress with better swelling control.  ABIs were done today which were normal at 1.14 on the right and 1.28 on the left with triphasic waveforms and normal digital pressures and waveforms bilaterally.  Current Outpatient Medications  Medication Sig Dispense Refill   acetaminophen (TYLENOL) 500 MG tablet Take 1,000 mg by mouth every 6 (six) hours as needed.     albuterol (VENTOLIN HFA) 108 (90 Base) MCG/ACT inhaler Inhale 2 puffs into the lungs every 6 (six) hours as needed for wheezing or shortness of breath. 1 each 0   ALPRAZolam (XANAX) 0.5 MG tablet Take 1 tablet by mouth as needed.     ASHWAGANDHA PO Take by mouth.     bismuth subsalicylate (PEPTO BISMOL) 262 MG/15ML suspension Take 30 mLs by mouth every 6 (six) hours as needed. OTC     dicyclomine (BENTYL) 20 MG tablet TAKE (1) TABLET BY MOUTH THREE TIMES A DAY BEFORE MEALS. 90 tablet 3   diphenoxylate-atropine (LOMOTIL) 2.5-0.025 MG tablet TAKE (1) TABLET BY MOUTH FOUR TIMES A DAY AS NEEDED FOR DIARRHEA 120 tablet 5   famotidine (PEPCID) 10 MG tablet Take 10 mg by mouth 2 (two) times daily. OTC     ibuprofen (ADVIL) 600 MG tablet Take 1 tablet (600 mg total) by mouth every 8 (eight) hours as needed. 100 tablet 5   Multiple Vitamin (MULTI-VITAMIN PO) Take by mouth daily.     Probiotic Product (PROBIOTIC PO) Take by mouth.     promethazine-dextromethorphan (PROMETHAZINE-DM) 6.25-15 MG/5ML syrup Take 5 mLs by  mouth 4 (four) times daily as needed for cough. 118 mL 0   triamcinolone cream (KENALOG) 0.1 % Apply 1 application topically as needed. HIVES from Azithromy reaction.     UNABLE TO FIND Med Name: Red Algae     No current facility-administered medications for this visit.    Past Medical History:  Diagnosis Date   Achilles tendinitis 06/16/2020   Acute gastritis without hemorrhage    Adaptive colitis 09/19/2013   Arthritis    knees   Chronic diarrhea 07/31/2013   DVT (deep venous thrombosis) ([place]) 12/21/2018   LLL   GERD (gastroesophageal reflux disease)    Hepatitis B 1989   resolved   Skin cancer    on face - removed under local    Past Surgical History:  Procedure Laterality Date   COLONOSCOPY  02/15/11   repeat 10 years   COLONOSCOPY WITH PROPOFOL N/A 08/15/2016   Procedure: COLONOSCOPY WITH PROPOFOL;  Surgeon: Lucilla Lame, MD;  Location: [place];  Service: Endoscopy;  Laterality: N/A;   COLONOSCOPY WITH PROPOFOL N/A 09/14/2020   Procedure: COLONOSCOPY WITH BIOPSY;  Surgeon: Lucilla Lame, MD;  Location: [place];  Service: Endoscopy;  Laterality: N/A;   ESOPHAGOGASTRODUODENOSCOPY (EGD) WITH PROPOFOL N/A 08/15/2016   Procedure: ESOPHAGOGASTRODUODENOSCOPY (EGD) WITH PROPOFOL;  Surgeon: Lucilla Lame, MD;  Location: [place];  Service: Endoscopy;  Laterality: N/A;   ESOPHAGOGASTRODUODENOSCOPY (EGD) WITH PROPOFOL N/A 09/14/2020   Procedure: ESOPHAGOGASTRODUODENOSCOPY (EGD) WITH PROPOFOL;  Surgeon: Lucilla Lame, MD;  Location: [place];  Service: Endoscopy;  Laterality: N/A;   POLYPECTOMY  08/15/2016   Procedure: POLYPECTOMY;  Surgeon: Lucilla Lame, MD;  Location: [place];  Service: Endoscopy;;   POLYPECTOMY N/A 09/14/2020   Procedure: POLYPECTOMY;  Surgeon: Lucilla Lame, MD;  Location: [place];  Service: Endoscopy;  Laterality: N/A;     Social History   Tobacco Use   Smoking status: Never    Passive exposure: Never    Smokeless tobacco: Never  Vaping Use   Vaping Use: Never used  Substance Use Topics   Alcohol use: Yes    Alcohol/week: 0.0 standard drinks of alcohol    Comment: occasional   Drug use: No      Family History  Problem Relation Age of Onset   Healthy Mother    COPD Father      Allergies  Allergen Reactions   Azithromycin Hives   Amoxicillin    Clindamycin/Lincomycin Diarrhea   Doxycycline Hives and Diarrhea   Keflex [Cephalexin] Swelling    REVIEW OF SYSTEMS (Negative unless checked)   Constitutional: '[]'$ Weight loss  '[]'$ Fever  '[]'$ Chills Cardiac: '[]'$ Chest pain   '[]'$ Chest pressure   '[]'$ Palpitations   '[]'$ Shortness of breath when laying flat   '[]'$ Shortness of breath at rest   '[]'$ Shortness of breath with exertion. Vascular:  '[]'$ Pain in legs with walking   '[]'$ Pain in legs at rest   '[]'$ Pain in legs when laying flat   '[]'$ Claudication   '[]'$ Pain in feet when walking  '[]'$ Pain in feet at rest  '[]'$ Pain in feet when laying flat   '[]'$ History of DVT   '[]'$ Phlebitis   '[x]'$ Swelling in legs   '[]'$ Varicose veins   '[]'$ Non-healing ulcers Pulmonary:   '[]'$ Uses home oxygen   '[]'$ Productive cough   '[]'$ Hemoptysis   '[]'$ Wheeze  '[]'$ COPD   '[]'$ Asthma Neurologic:  '[]'$ Dizziness  '[]'$ Blackouts   '[]'$ Seizures   '[]'$ History of stroke   '[]'$ History of TIA  '[]'$ Aphasia   '[]'$ Temporary blindness   '[]'$ Dysphagia   '[]'$ Weakness or numbness in arms   '[]'$ Weakness or numbness in legs Musculoskeletal:  '[x]'$ Arthritis   '[]'$ Joint swelling   '[]'$ Joint pain   '[]'$ Low back pain Hematologic:  '[]'$ Easy bruising  '[]'$ Easy bleeding   '[]'$ Hypercoagulable state   '[]'$ Anemic   Gastrointestinal:  '[]'$ Blood in stool   '[]'$ Vomiting blood  '[x]'$ Gastroesophageal reflux/heartburn   '[]'$ Abdominal pain Genitourinary:  '[]'$ Chronic kidney disease   '[]'$ Difficult urination  '[]'$ Frequent urination  '[]'$ Burning with urination   '[]'$ Hematuria Skin:  '[]'$ Rashes   '[]'$ Ulcers   '[]'$ Wounds Psychological:  '[]'$ History of anxiety   '[]'$  History of major depression.  Physical Examination  BP 136/89 (BP Location: Right Arm)   Pulse 70    Resp 16   Wt 257 lb (116.6 kg)   BMI 35.84 kg/m  Gen:  WD/WN, NAD Head: [place]/AT, No temporalis wasting. Ear/Nose/Throat: Hearing grossly intact, nares w/o erythema or drainage Eyes: Conjunctiva clear. Sclera non-icteric Neck: Supple.  Trachea midline Pulmonary:  Good air movement, no use of accessory muscles.  Cardiac: RRR, no JVD Vascular:  Vessel Right Left  Radial Palpable Palpable                          PT 1+ palpable Trace palpable  DP 2+ palpable 1+ palpable   Gastrointestinal: soft, non-tender/non-distended. No guarding/reflex.  Musculoskeletal: M/S 5/5 throughout.  No deformity or  atrophy.  No significant right lower extremity edema.  1-2+ left lower extremity edema.  Moderate stasis dermatitis changes are present in the left lower extremity Neurologic: Sensation grossly intact in extremities.  Symmetrical.  Speech is fluent.  Psychiatric: Judgment intact, Mood & affect appropriate for pt's clinical situation. Dermatologic: No rashes or ulcers noted.  Stasis dermatitis changes are present on the left lower extremity      Labs Recent Results (from the past 2160 hour(s))  PSA (Reflex To Free) (Serial)     Status: None   Collection Time: 07/27/22 11:11 AM  Result Value Ref Range   Prostate Specific Ag, Serum 1.4 0.0 - 4.0 ng/mL    Comment: (NOTE) Roche ECLIA methodology. According to the American Urological Association, Serum PSA should decrease and remain at undetectable levels after radical prostatectomy. The AUA defines biochemical recurrence as an initial PSA value 0.2 ng/mL or greater followed by a subsequent confirmatory PSA value 0.2 ng/mL or greater. Values obtained with different assay methods or kits cannot be used interchangeably. Results cannot be interpreted as absolute evidence of the presence or absence of malignant disease.    Reflex Criteria Comment     Comment: (NOTE) The percent free PSA is performed on a reflex basis only when the total  PSA is between 4.0 and 10.0 ng/mL. Performed At: [facility] [place], [place] [id number] Rush Farmer MD IY:[id number]     Radiology No results found.  Assessment/Plan  Lymphedema Swelling is under much better control with compression, elevation, and the pump.  Follow-up in 1 year.  Post-phlebitic syndrome Continue current measures.  Symptom control is reasonably good at this point.  Pallor of extremity ABIs were checked today and were normal.  No lower extremity arterial insufficiency present.    Leotis Pain, MD  08/30/2022 9:36 AM    This note was created with Dragon medical transcription system.  Any errors from dictation are purely unintentional

## 2022-08-30 NOTE — Assessment & Plan Note (Signed)
Continue current measures.  Symptom control is reasonably good at this point.

## 2022-09-05 ENCOUNTER — Encounter (INDEPENDENT_AMBULATORY_CARE_PROVIDER_SITE_OTHER): Payer: Self-pay

## 2022-09-09 ENCOUNTER — Other Ambulatory Visit: Payer: Self-pay | Admitting: Internal Medicine

## 2022-09-09 NOTE — Telephone Encounter (Signed)
Requested medication (s) are due for refill today - provider review   Requested medication (s) are on the active medication list -yes  Future visit scheduled -no  Last refill: 02/23/22  Notes to clinic: non delegated Rx- listed as historical   Requested Prescriptions  Pending Prescriptions Disp Refills   ALPRAZolam (XANAX) 0.5 MG tablet [Pharmacy Med Name: ALPRAZOLAM 0.5 MG TAB] 20 tablet     Sig: TAKE 1/2 TABLET BY MOUTH TWICE DAILY AS NEEDED FOR ANXIETY.     Not Delegated - Psychiatry: Anxiolytics/Hypnotics 2 Failed - 09/09/2022 12:49 PM      Failed - This refill cannot be delegated      Failed - Urine Drug Screen completed in last 360 days      Passed - Patient is not pregnant      Passed - Valid encounter within last 6 months    Recent Outpatient Visits           1 month ago Upper respiratory tract infection, unspecified type   [place] [facility] at [facility], Jesse Sans, MD   1 month ago Acute non-recurrent sinusitis, unspecified location   [facility] at [place], Earley Abide, MD   6 months ago Diarrhea of presumed infectious origin   [place] [facility], Jesse Sans, MD   8 months ago Shortness of breath   [place] [facility] at [facility], Jesse Sans, MD   9 months ago COVID-19 virus infection   [facility] at [facility], Jesse Sans, MD       Future Appointments             In 10 months [facility] Herbert Seta, MD [place]               Requested Prescriptions  Pending Prescriptions Disp Refills   ALPRAZolam (XANAX) 0.5 MG tablet [Pharmacy Med Name: ALPRAZOLAM 0.5 MG TAB] 20 tablet     Sig: TAKE 1/2 TABLET BY MOUTH TWICE DAILY AS NEEDED FOR ANXIETY.     Not Delegated - Psychiatry: Anxiolytics/Hypnotics 2 Failed - 09/09/2022 12:49  PM      Failed - This refill cannot be delegated      Failed - Urine Drug Screen completed in last 360 days      Passed - Patient is not pregnant      Passed - Valid encounter within last 6 months    Recent Outpatient Visits           1 month ago Upper respiratory tract infection, unspecified type   [place] [facility] at [facility], Jesse Sans, MD   1 month ago Acute non-recurrent sinusitis, unspecified location   [facility] at [place], Earley Abide, MD   6 months ago Diarrhea of presumed infectious origin   [place] at [facility], Jesse Sans, MD   8 months ago Shortness of breath   [place] [facility] at [facility], Jesse Sans, MD   9 months ago COVID-19 virus infection   [facility] at [facility], Jesse Sans, MD       Future Appointments  In 10 months [place], Herbert Seta, MD [facility] [place]

## 2022-11-14 ENCOUNTER — Encounter: Payer: Self-pay | Admitting: Family Medicine

## 2022-11-14 ENCOUNTER — Ambulatory Visit (INDEPENDENT_AMBULATORY_CARE_PROVIDER_SITE_OTHER): Payer: 59 | Admitting: Family Medicine

## 2022-11-14 ENCOUNTER — Ambulatory Visit: Payer: Self-pay | Admitting: *Deleted

## 2022-11-14 VITALS — BP 100/70 | HR 88 | Temp 98.3°F | Ht 71.0 in | Wt 264.0 lb

## 2022-11-14 DIAGNOSIS — U071 COVID-19: Secondary | ICD-10-CM | POA: Diagnosis not present

## 2022-11-14 MED ORDER — MOLNUPIRAVIR EUA 200MG CAPSULE: 4.0000 | ORAL_CAPSULE | Freq: Two times a day (BID) | ORAL | 0 refills | Status: AC

## 2022-11-14 MED ORDER — PROMETHAZINE-DM 6.25-15 MG/5ML PO SYRP: 5.0000 mL | ORAL_SOLUTION | Freq: Four times a day (QID) | ORAL | 0 refills | Status: DC | PRN

## 2022-11-14 NOTE — Progress Notes (Addendum)
Date:  11/14/2022   Name:  Derrick Galvan.   DOB:  01/06/1961   MRN:  [id number]   Chief Complaint: Covid Positive (Symptoms started Friday with light scratchy throat. Tested positive last night. Fever and chills, cough with some production/ not much, head congestion and teeth pain)  Fever  This is a new problem. The current episode started in the past 7 days. The problem occurs intermittently. The problem has been waxing and waning. Associated symptoms include coughing, headaches and muscle aches. Pertinent negatives include no abdominal pain, chest pain, congestion, diarrhea, ear pain, nausea, rash, sleepiness, sore throat, urinary pain, vomiting or wheezing. He has tried acetaminophen for the symptoms. The treatment provided mild relief.    Lab Results  Component Value Date   NA 142 11/16/2021   K 4.8 11/16/2021   CO2 27 11/16/2021   GLUCOSE 71 11/16/2021   BUN 21 11/16/2021   CREATININE 1.12 11/16/2021   CALCIUM 9.8 11/16/2021   EGFR 75 11/16/2021   GFRNONAA 67 09/02/2020   Lab Results  Component Value Date   CHOL 187 11/16/2021   HDL 48 11/16/2021   LDLCALC 126 (H) 11/16/2021   TRIG 70 11/16/2021   CHOLHDL 3.9 11/16/2021   Lab Results  Component Value Date   TSH 0.690 11/16/2021   No results found for: "HGBA1C" Lab Results  Component Value Date   WBC 7.6 11/16/2021   HGB 16.9 11/16/2021   HCT 49.2 11/16/2021   MCV 91 11/16/2021   PLT 147 (L) 11/16/2021   Lab Results  Component Value Date   ALT 21 11/16/2021   AST 24 11/16/2021   ALKPHOS 83 11/16/2021   BILITOT 1.7 (H) 11/16/2021   No results found for: "25OHVITD2", "25OHVITD3", "VD25OH"   Review of Systems  Constitutional:  Positive for fever.  HENT:  Positive for postnasal drip, rhinorrhea, sinus pressure, sinus pain and sneezing. Negative for congestion, ear pain, nosebleeds and sore throat.   Respiratory:  Positive for cough. Negative for chest tightness, shortness of breath and wheezing.    Cardiovascular:  Negative for chest pain, palpitations and leg swelling.  Gastrointestinal:  Negative for abdominal pain, diarrhea, nausea and vomiting.  Genitourinary:  Negative for dysuria.  Skin:  Negative for rash.  Neurological:  Positive for headaches.    Patient Active Problem List   Diagnosis Date Noted   Pallor of extremity 08/30/2022   Acute non-recurrent sinusitis 07/14/2022   LBBB (left bundle branch block) 01/11/2022   Hyperlipidemia, mixed 01/11/2022   Lymphedema 08/24/2021   Post-phlebitic syndrome 08/24/2021   Gastroesophageal reflux disease without esophagitis    Personal history of colonic polyps    Current moderate episode of major depressive disorder without prior episode ([place]) 09/02/2020   Varicose veins of right lower extremity with complications 29/52/8413   Generalized anxiety disorder 08/22/2019   SCCA (squamous cell carcinoma) of skin 02/01/2019   Sleep disturbance 12/18/2018   Benign neoplasm of ascending colon    Adaptive colitis 09/19/2013    Allergies  Allergen Reactions   Azithromycin Hives   Amoxicillin    Clindamycin/Lincomycin Diarrhea   Doxycycline Hives and Diarrhea   Keflex [Cephalexin] Swelling    Past Surgical History:  Procedure Laterality Date   COLONOSCOPY  02/15/11   repeat 10 years   COLONOSCOPY WITH PROPOFOL N/A 08/15/2016   Procedure: COLONOSCOPY WITH PROPOFOL;  Surgeon: Lucilla Lame, MD;  Location: [place];  Service: Endoscopy;  Laterality: N/A;   COLONOSCOPY WITH PROPOFOL N/A 09/14/2020  Procedure: COLONOSCOPY WITH BIOPSY;  Surgeon: Lucilla Lame, MD;  Location: [place];  Service: Endoscopy;  Laterality: N/A;   ESOPHAGOGASTRODUODENOSCOPY (EGD) WITH PROPOFOL N/A 08/15/2016   Procedure: ESOPHAGOGASTRODUODENOSCOPY (EGD) WITH PROPOFOL;  Surgeon: Lucilla Lame, MD;  Location: [place];  Service: Endoscopy;  Laterality: N/A;   ESOPHAGOGASTRODUODENOSCOPY (EGD) WITH PROPOFOL N/A 09/14/2020   Procedure:  ESOPHAGOGASTRODUODENOSCOPY (EGD) WITH PROPOFOL;  Surgeon: Lucilla Lame, MD;  Location: [place];  Service: Endoscopy;  Laterality: N/A;   POLYPECTOMY  08/15/2016   Procedure: POLYPECTOMY;  Surgeon: Lucilla Lame, MD;  Location: [place];  Service: Endoscopy;;   POLYPECTOMY N/A 09/14/2020   Procedure: POLYPECTOMY;  Surgeon: Lucilla Lame, MD;  Location: [place];  Service: Endoscopy;  Laterality: N/A;    Social History   Tobacco Use   Smoking status: Never    Passive exposure: Never   Smokeless tobacco: Never  Vaping Use   Vaping Use: Never used  Substance Use Topics   Alcohol use: Yes    Alcohol/week: 0.0 standard drinks of alcohol    Comment: occasional   Drug use: No     Medication list has been reviewed and updated.  Current Meds  Medication Sig   acetaminophen (TYLENOL) 500 MG tablet Take 1,000 mg by mouth every 6 (six) hours as needed.   ALPRAZolam (XANAX) 0.5 MG tablet Take 1 tablet by mouth as needed.   bismuth subsalicylate (PEPTO BISMOL) 262 MG/15ML suspension Take 30 mLs by mouth every 6 (six) hours as needed. OTC   dicyclomine (BENTYL) 20 MG tablet TAKE (1) TABLET BY MOUTH THREE TIMES A DAY BEFORE MEALS.   diphenoxylate-atropine (LOMOTIL) 2.5-0.025 MG tablet TAKE (1) TABLET BY MOUTH FOUR TIMES A DAY AS NEEDED FOR DIARRHEA   famotidine (PEPCID) 10 MG tablet Take 10 mg by mouth 2 (two) times daily. OTC   ibuprofen (ADVIL) 600 MG tablet Take 1 tablet (600 mg total) by mouth every 8 (eight) hours as needed.   Multiple Vitamin (MULTI-VITAMIN PO) Take by mouth daily.   Probiotic Product (PROBIOTIC PO) Take by mouth.   promethazine-dextromethorphan (PROMETHAZINE-DM) 6.25-15 MG/5ML syrup Take 5 mLs by mouth 4 (four) times daily as needed for cough.   triamcinolone cream (KENALOG) 0.1 % Apply 1 application topically as needed. HIVES from Azithromy reaction.   UNABLE TO FIND Med Name: Red Algae       11/14/2022    4:27 PM 07/19/2022   11:34 AM  07/14/2022    4:46 PM 02/23/2022    2:04 PM  GAD 7 : Generalized Anxiety Score  Nervous, Anxious, on Edge '1 1 2 2  '$ Control/stop worrying 1 0 2 2  Worry too much - different things 0 '1 2 2  '$ Trouble relaxing 0 0 1 1  Restless 0 0 0 0  Easily annoyed or irritable 0 '2 2 2  '$ Afraid - awful might happen 0 '2 2 2  '$ Total GAD 7 Score '2 6 11 11  '$ Anxiety Difficulty Not difficult at all Not difficult at all Not difficult at all Not difficult at all       11/14/2022    4:27 PM 07/19/2022   11:33 AM 07/14/2022    4:46 PM  Depression screen PHQ 2/9  Decreased Interest 0 1 1  Down, Depressed, Hopeless 0 1 1  PHQ - 2 Score 0 2 2  Altered sleeping 0 2 1  Tired, decreased energy 0 2 1  Change in appetite 0 2 1  Feeling bad or  failure about yourself  0 2 0  Trouble concentrating 0 0 0  Moving slowly or fidgety/restless 0 0 0  Suicidal thoughts 0 0 0  PHQ-9 Score 0 10 5  Difficult doing work/chores Not difficult at all Not difficult at all Not difficult at all    BP Readings from Last 3 Encounters:  11/14/22 100/70  08/30/22 136/89  08/02/22 123/76    Physical Exam Vitals and nursing note reviewed.  HENT:     Head: Normocephalic.     Right Ear: Tympanic membrane and external ear normal.     Left Ear: Tympanic membrane and external ear normal.     Nose: Nose normal. No congestion.     Right Turbinates: Swollen.     Left Turbinates: Swollen.     Right Sinus: No maxillary sinus tenderness or frontal sinus tenderness.     Left Sinus: No maxillary sinus tenderness or frontal sinus tenderness.     Mouth/Throat:     Mouth: Mucous membranes are moist.     Pharynx: Oropharynx is clear. No pharyngeal swelling or oropharyngeal exudate.  Eyes:     General: No scleral icterus.       Right eye: No discharge.        Left eye: No discharge.     Conjunctiva/sclera: Conjunctivae normal.     Pupils: Pupils are equal, round, and reactive to light.  Neck:     Thyroid: No thyromegaly.     Vascular: No JVD.      Trachea: No tracheal deviation.  Cardiovascular:     Rate and Rhythm: Normal rate and regular rhythm.     Pulses: Normal pulses.     Heart sounds: Normal heart sounds, S1 normal and S2 normal. No murmur heard.    No systolic murmur is present.     No diastolic murmur is present.     No friction rub. No gallop. No S3 or S4 sounds.  Pulmonary:     Effort: No respiratory distress.     Breath sounds: Normal breath sounds. No transmitted upper airway sounds. No decreased breath sounds, wheezing, rhonchi or rales.  Abdominal:     General: Bowel sounds are normal.     Palpations: Abdomen is soft. There is no mass.     Tenderness: There is no abdominal tenderness. There is no guarding or rebound.  Musculoskeletal:        General: No tenderness. Normal range of motion.     Cervical back: Normal range of motion and neck supple.     Right lower leg: No edema.     Left lower leg: No edema.  Lymphadenopathy:     Cervical: No cervical adenopathy.  Skin:    General: Skin is warm.     Findings: No rash.  Neurological:     Mental Status: He is alert.     Wt Readings from Last 3 Encounters:  11/14/22 264 lb (119.7 kg)  08/30/22 257 lb (116.6 kg)  08/02/22 236 lb 3.2 oz (107.1 kg)    BP 100/70   Pulse 88   Temp 98.3 F (36.8 C) (Oral)   Ht '5\' 11"'$  (1.803 m)   Wt 264 lb (119.7 kg)   SpO2 93%   BMI 36.82 kg/m   Assessment and Plan:  1. COVID-19 virus infection New onset.  Persistent.  Stable.  Patient tested positive for Covid over the weekend with fever chills myalgias.  Patient also has a nonproductive cough.  We will treat with molnupiravir  given his multiple medication allergies as directed and encouraged Mucinex plain as a mucolytic agent along with the promethazine dextromethorphan DM for cough suppression. - promethazine-dextromethorphan (PROMETHAZINE-DM) 6.25-15 MG/5ML syrup; Take 5 mLs by mouth 4 (four) times daily as needed for cough.  Dispense: 118 mL; Refill: 0 -  molnupiravir EUA (LAGEVRIO) 200 mg CAPS capsule; Take 4 capsules (800 mg total) by mouth 2 (two) times daily for 5 days.  Dispense: 40 capsule; Refill: 0    Otilio Miu, MD

## 2022-11-14 NOTE — Telephone Encounter (Signed)
  Chief Complaint: + COVID Symptoms: cough, congestion, body aches Frequency: symptoms started late Friday night- Saturday am Pertinent Negatives: Patient denies SOB Disposition: '[]'$ ED /'[]'$ Urgent Care (no appt availability in office) / '[x]'$ Appointment(In office/virtual)/ '[]'$  [place] [facility]/ '[]'$ Home Care/ '[]'$ Refused Recommended Disposition /'[]'$ [place] [facility]/ '[]'$  Follow-up with PCP Additional Notes: Patient request antiviral treatment - appointment scheduled

## 2022-11-14 NOTE — Telephone Encounter (Signed)
Summary: covid and paxlovid   Pt tested positive for covid he is experiencing head congestion, fever, mild headache ./ symptoms of sore throat started Saturday / pt also has a cough that is moving into his chest / pt asked for paxlovid / please advise     Reason for Disposition . [1] HIGH RISK patient (e.g., weak immune system, age > 38 years, obesity with BMI 30 or higher, pregnant, chronic lung disease or other chronic medical condition) AND [2] COVID symptoms (e.g., cough, fever)  (Exceptions: Already seen by PCP and no new or worsening symptoms.)  Answer Assessment - Initial Assessment Questions 1. COVID-19 DIAGNOSIS: "How do you know that you have COVID?" (e.g., positive lab test or self-test, diagnosed by doctor or NP/PA, symptoms after exposure).     + COVID home test last night 2. COVID-19 EXPOSURE: "Was there any known exposure to COVID before the symptoms began?" CDC Definition of close contact: within 6 feet (2 meters) for a total of 15 minutes or more over a 24-hour period.      Work exposure 3. ONSET: "When did the COVID-19 symptoms start?"      Saturday am 4. WORST SYMPTOM: "What is your worst symptom?" (e.g., cough, fever, shortness of breath, muscle aches)     Head congestion 5. COUGH: "Do you have a cough?" If Yes, ask: "How bad is the cough?"       Yes- alternating between wet and dry 6. FEVER: "Do you have a fever?" If Yes, ask: "What is your temperature, how was it measured, and when did it start?"     Unsure- not taken 7. RESPIRATORY STATUS: "Describe your breathing?" (e.g., normal; shortness of breath, wheezing, unable to speak)      No SOB- chest congestion 8. BETTER-SAME-WORSE: "Are you getting better, staying the same or getting worse compared to yesterday?"  If getting worse, ask, "In what way?"     Worse- increased symptoms 9. OTHER SYMPTOMS: "Do you have any other symptoms?"  (e.g., chills, fatigue, headache, loss of smell or taste, muscle pain, sore throat)      Stomach indigestion 10. HIGH RISK DISEASE: "Do you have any chronic medical problems?" (e.g., asthma, heart or lung disease, weak immune system, obesity, etc.)       Hx pneumonia  11. VACCINE: "Have you had the COVID-19 vaccine?" If Yes, ask: "Which one, how many shots, when did you get it?"       Not up to date with booster 12. PREGNANCY: "Is there any chance you are pregnant?" "When was your last menstrual period?"       na 13. O2 SATURATION MONITOR:  "Do you use an oxygen saturation monitor (pulse oximeter) at home?" If Yes, ask "What is your reading (oxygen level) today?" "What is your usual oxygen saturation reading?" (e.g., 95%)       na  Protocols used: Coronavirus (COVID-19) Diagnosed or Suspected-A-AH

## 2022-11-14 NOTE — Patient Instructions (Signed)

## 2022-11-22 ENCOUNTER — Ambulatory Visit: Payer: 59 | Admitting: Internal Medicine

## 2022-12-14 ENCOUNTER — Ambulatory Visit (INDEPENDENT_AMBULATORY_CARE_PROVIDER_SITE_OTHER): Payer: 59 | Admitting: Internal Medicine

## 2022-12-14 ENCOUNTER — Encounter: Payer: Self-pay | Admitting: Internal Medicine

## 2022-12-14 VITALS — BP 124/70 | HR 76 | Ht 71.0 in | Wt 263.2 lb

## 2022-12-14 DIAGNOSIS — M1810 Unilateral primary osteoarthritis of first carpometacarpal joint, unspecified hand: Secondary | ICD-10-CM

## 2022-12-14 DIAGNOSIS — F411 Generalized anxiety disorder: Secondary | ICD-10-CM | POA: Diagnosis not present

## 2022-12-14 MED ORDER — MELOXICAM 15 MG PO TABS: 15.0000 mg | ORAL_TABLET | Freq: Every day | ORAL | 1 refills | Status: DC

## 2022-12-14 MED ORDER — ALPRAZOLAM 0.5 MG PO TABS: 0.5000 mg | ORAL_TABLET | ORAL | 0 refills | Status: DC | PRN

## 2022-12-14 NOTE — Assessment & Plan Note (Signed)
Doing fairly well but is out of xanax He uses xanax rarely and needs new Rx

## 2022-12-14 NOTE — Progress Notes (Signed)
Date:  12/14/2022   Name:  Derrick Galvan.   DOB:  [birth year]   MRN:  [id number]   Chief Complaint: Anxiety  Anxiety Presents for follow-up visit. Symptoms include nervous/anxious behavior. Patient reports no chest pain, nausea or shortness of breath.    Arthritis of hands and knees - on Mobic and has benefit.  Thumbs are more bothersome.    Lab Results  Component Value Date   NA 142 11/16/2021   K 4.8 11/16/2021   CO2 27 11/16/2021   GLUCOSE 71 11/16/2021   BUN 21 11/16/2021   CREATININE 1.12 11/16/2021   CALCIUM 9.8 11/16/2021   EGFR 75 11/16/2021   GFRNONAA 67 09/02/2020   Lab Results  Component Value Date   CHOL 187 11/16/2021   HDL 48 11/16/2021   LDLCALC 126 (H) 11/16/2021   TRIG 70 11/16/2021   CHOLHDL 3.9 11/16/2021   Lab Results  Component Value Date   TSH 0.690 11/16/2021   No results found for: "HGBA1C" Lab Results  Component Value Date   WBC 7.6 11/16/2021   HGB 16.9 11/16/2021   HCT 49.2 11/16/2021   MCV 91 11/16/2021   PLT 147 (L) 11/16/2021   Lab Results  Component Value Date   ALT 21 11/16/2021   AST 24 11/16/2021   ALKPHOS 83 11/16/2021   BILITOT 1.7 (H) 11/16/2021   No results found for: "25OHVITD2", "25OHVITD3", "VD25OH"   Review of Systems  Constitutional:  Positive for fatigue. Negative for chills and diaphoresis.  HENT:  Negative for sinus pressure and trouble swallowing.   Respiratory:  Negative for cough, chest tightness and shortness of breath.   Cardiovascular:  Negative for chest pain and leg swelling.  Gastrointestinal:  Negative for abdominal pain, diarrhea, nausea and vomiting.  Psychiatric/Behavioral:  Negative for dysphoric mood and sleep disturbance. The patient is nervous/anxious.     Patient Active Problem List   Diagnosis Date Noted   Pallor of extremity 08/30/2022   Acute non-recurrent sinusitis 07/14/2022   LBBB (left bundle branch block) 01/11/2022   Hyperlipidemia, mixed 01/11/2022   Lymphedema 08/24/2021    Post-phlebitic syndrome 08/24/2021   Gastroesophageal reflux disease without esophagitis    Personal history of colonic polyps    Current moderate episode of major depressive disorder without prior episode ([place]) 09/02/2020   Varicose veins of right lower extremity with complications 73/71/0626   Generalized anxiety disorder 08/22/2019   SCCA (squamous cell carcinoma) of skin 02/01/2019   Sleep disturbance 12/18/2018   Benign neoplasm of ascending colon    Adaptive colitis 09/19/2013    Allergies  Allergen Reactions   Azithromycin Hives   Amoxicillin    Clindamycin/Lincomycin Diarrhea   Doxycycline Hives and Diarrhea   Keflex [Cephalexin] Swelling    Past Surgical History:  Procedure Laterality Date   COLONOSCOPY  02/15/11   repeat 10 years   COLONOSCOPY WITH PROPOFOL N/A 08/15/2016   Procedure: COLONOSCOPY WITH PROPOFOL;  Surgeon: Lucilla Lame, MD;  Location: [place];  Service: Endoscopy;  Laterality: N/A;   COLONOSCOPY WITH PROPOFOL N/A 09/14/2020   Procedure: COLONOSCOPY WITH BIOPSY;  Surgeon: Lucilla Lame, MD;  Location: [place];  Service: Endoscopy;  Laterality: N/A;   ESOPHAGOGASTRODUODENOSCOPY (EGD) WITH PROPOFOL N/A 08/15/2016   Procedure: ESOPHAGOGASTRODUODENOSCOPY (EGD) WITH PROPOFOL;  Surgeon: Lucilla Lame, MD;  Location: [place];  Service: Endoscopy;  Laterality: N/A;   ESOPHAGOGASTRODUODENOSCOPY (EGD) WITH PROPOFOL N/A 09/14/2020   Procedure: ESOPHAGOGASTRODUODENOSCOPY (EGD) WITH PROPOFOL;  Surgeon: Lucilla Lame,  MD;  Location: [place];  Service: Endoscopy;  Laterality: N/A;   POLYPECTOMY  08/15/2016   Procedure: POLYPECTOMY;  Surgeon: Lucilla Lame, MD;  Location: [place];  Service: Endoscopy;;   POLYPECTOMY N/A 09/14/2020   Procedure: POLYPECTOMY;  Surgeon: Lucilla Lame, MD;  Location: [employer] [facility];  Service: Endoscopy;  Laterality: N/A;    Social History   Tobacco Use   Smoking status: Never     Passive exposure: Never   Smokeless tobacco: Never  Vaping Use   Vaping Use: Never used  Substance Use Topics   Alcohol use: Yes    Alcohol/week: 0.0 standard drinks of alcohol    Comment: occasional   Drug use: No     Medication list has been reviewed and updated.  Current Meds  Medication Sig   acetaminophen (TYLENOL) 500 MG tablet Take 1,000 mg by mouth every 6 (six) hours as needed.   albuterol (VENTOLIN HFA) 108 (90 Base) MCG/ACT inhaler Inhale 2 puffs into the lungs every 6 (six) hours as needed for wheezing or shortness of breath.   ALPRAZolam (XANAX) 0.5 MG tablet Take 1 tablet by mouth as needed.   ASHWAGANDHA PO Take by mouth.   bismuth subsalicylate (PEPTO BISMOL) 262 MG/15ML suspension Take 30 mLs by mouth every 6 (six) hours as needed. OTC   calcium carbonate (OS-CAL - DOSED IN MG OF ELEMENTAL CALCIUM) 1250 (500 Ca) MG tablet Take 1 tablet by mouth.   dicyclomine (BENTYL) 20 MG tablet TAKE (1) TABLET BY MOUTH THREE TIMES A DAY BEFORE MEALS.   diphenoxylate-atropine (LOMOTIL) 2.5-0.025 MG tablet TAKE (1) TABLET BY MOUTH FOUR TIMES A DAY AS NEEDED FOR DIARRHEA   famotidine (PEPCID) 10 MG tablet Take 10 mg by mouth 2 (two) times daily. OTC   ibuprofen (ADVIL) 600 MG tablet Take 1 tablet (600 mg total) by mouth every 8 (eight) hours as needed.   meloxicam (MOBIC) 15 MG tablet Take 15 mg by mouth daily.   Multiple Vitamin (MULTI-VITAMIN PO) Take by mouth daily.   Probiotic Product (PROBIOTIC PO) Take by mouth.   triamcinolone cream (KENALOG) 0.1 % Apply 1 application topically as needed. HIVES from Azithromy reaction.   UNABLE TO FIND Med Name: Red Algae   Zinc Sulfate (ZINC 15 PO) Take by mouth.       12/14/2022    1:25 PM 11/14/2022    4:27 PM 07/19/2022   11:34 AM 07/14/2022    4:46 PM  GAD 7 : Generalized Anxiety Score  Nervous, Anxious, on Edge '2 1 1 2  '$ Control/stop worrying 2 1 0 2  Worry too much - different things 2 0 1 2  Trouble relaxing 2 0 0 1  Restless 0 0 0  0  Easily annoyed or irritable 2 0 2 2  Afraid - awful might happen 2 0 2 2  Total GAD 7 Score '12 2 6 11  '$ Anxiety Difficulty Somewhat difficult Not difficult at all Not difficult at all Not difficult at all       12/14/2022    1:24 PM 11/14/2022    4:27 PM 07/19/2022   11:33 AM  Depression screen PHQ 2/9  Decreased Interest 0 0 1  Down, Depressed, Hopeless 1 0 1  PHQ - 2 Score 1 0 2  Altered sleeping 2 0 2  Tired, decreased energy 0 0 2  Change in appetite 1 0 2  Feeling bad or failure about yourself  1 0 2  Trouble concentrating 0 0  0  Moving slowly or fidgety/restless 0 0 0  Suicidal thoughts 0 0 0  PHQ-9 Score 5 0 10  Difficult doing work/chores Somewhat difficult Not difficult at all Not difficult at all    BP Readings from Last 3 Encounters:  12/14/22 124/70  11/14/22 100/70  08/30/22 136/89    Physical Exam Vitals and nursing note reviewed.  Constitutional:      General: He is not in acute distress.    Appearance: He is well-developed.  HENT:     Head: Normocephalic and atraumatic.  Cardiovascular:     Rate and Rhythm: Normal rate and regular rhythm.  Pulmonary:     Effort: Pulmonary effort is normal. No respiratory distress.     Breath sounds: No wheezing or rhonchi.  Musculoskeletal:     Cervical back: Normal range of motion.     Comments: Painful both PIP joints thumbs  Lymphadenopathy:     Cervical: No cervical adenopathy.  Skin:    General: Skin is warm and dry.     Capillary Refill: Capillary refill takes less than 2 seconds.     Findings: No rash.  Neurological:     General: No focal deficit present.     Mental Status: He is alert and oriented to person, place, and time.  Psychiatric:        Mood and Affect: Mood normal.        Behavior: Behavior normal.     Wt Readings from Last 3 Encounters:  12/14/22 263 lb 3.2 oz (119.4 kg)  11/14/22 264 lb (119.7 kg)  08/30/22 257 lb (116.6 kg)    BP 124/70   Pulse 76   Ht '5\' 11"'$  (1.803 m)   Wt 263  lb 3.2 oz (119.4 kg)   SpO2 97%   BMI 36.71 kg/m   Assessment and Plan: Problem List Items Addressed This Visit       Other   Generalized anxiety disorder - Primary (Chronic)    Doing fairly well but is out of xanax He uses xanax rarely and needs new Rx      Other Visit Diagnoses     Osteoarthritis of thumb, unspecified laterality       Relevant Medications   meloxicam (MOBIC) 15 MG tablet        Partially dictated using Editor, commissioning. Any errors are unintentional.  Halina Maidens, MD [facility]  12/14/2022

## 2023-03-07 DIAGNOSIS — M19072 Primary osteoarthritis, left ankle and foot: Secondary | ICD-10-CM | POA: Insufficient documentation

## 2023-03-07 DIAGNOSIS — M2142 Flat foot [pes planus] (acquired), left foot: Secondary | ICD-10-CM | POA: Insufficient documentation

## 2023-03-16 ENCOUNTER — Ambulatory Visit: Payer: 59 | Admitting: Gastroenterology

## 2023-03-22 ENCOUNTER — Telehealth: Payer: Self-pay | Admitting: Gastroenterology

## 2023-03-22 NOTE — Telephone Encounter (Signed)
Error

## 2023-03-22 NOTE — Telephone Encounter (Signed)
Pt left vm has appointment  on 08/12/.2024 severe diarrhea and would like a call back to discuss being seen earlier by PA please return call

## 2023-03-24 ENCOUNTER — Telehealth: Payer: Self-pay | Admitting: Gastroenterology

## 2023-03-24 NOTE — Telephone Encounter (Signed)
Pt left message stating that he is having chronic diarrhea from antibiotic  please return call

## 2023-03-29 NOTE — Telephone Encounter (Signed)
Pt reports Sx have resolved, UC C diff was negative

## 2023-04-05 ENCOUNTER — Encounter: Payer: Self-pay | Admitting: Gastroenterology

## 2023-04-05 DIAGNOSIS — R197 Diarrhea, unspecified: Secondary | ICD-10-CM

## 2023-04-05 DIAGNOSIS — R109 Unspecified abdominal pain: Secondary | ICD-10-CM

## 2023-04-05 NOTE — Addendum Note (Signed)
Addended by: Roena Malady on: 04/05/2023 04:32 PM   Modules accepted: Orders

## 2023-04-11 LAB — CLOSTRIDIUM DIFFICILE BY PCR: Toxigenic C. Difficile by PCR: NEGATIVE

## 2023-04-12 LAB — GI PROFILE, STOOL, PCR

## 2023-04-26 ENCOUNTER — Encounter: Payer: 59 | Admitting: Internal Medicine

## 2023-04-26 NOTE — Assessment & Plan Note (Deleted)
Reflux symptoms are minimal on current therapy - Pepcid and antacids. No red flag signs such as weight loss, n/v, melena

## 2023-04-26 NOTE — Assessment & Plan Note (Deleted)
Clinically stable on current regimen with fair control of symptoms, No SI or HI. Using Xanax PRN; no daily medications.

## 2023-04-26 NOTE — Progress Notes (Deleted)
Date:  04/26/2023   Name:  Derrick Galvan.   DOB:  [birth year]   MRN:  [id number]   Chief Complaint: No chief complaint on file. Derrick Galvan. is a 62 y.o. male who presents today for his Complete Annual Exam. He feels {DESC; WELL/FAIRLY WELL/POORLY:18703}. He reports exercising ***. He reports he is sleeping {DESC; WELL/FAIRLY WELL/POORLY:18703}.   Colonoscopy: 09/2020  Immunization History  Administered Date(s) Administered   Influenza,inj,Quad PF,6+ Mos 08/22/2019, 09/02/2020   Influenza-Unspecified 07/15/2021   Moderna Covid-19 Vaccine Bivalent Booster 50yrs & up 07/15/2021   Moderna Sars-Covid-2 Vaccination 01/20/2020, 02/17/2020, 09/22/2020   Tdap 08/22/2019   Health Maintenance Due  Topic Date Due   HIV Screening  Never done   Zoster Vaccines- Shingrix (1 of 2) Never done   COVID-19 Vaccine (5 - 2023-24 season) 07/08/2022    Lab Results  Component Value Date   PSA1 1.4 07/27/2022   PSA1 1.1 09/02/2020   PSA1 0.8 08/22/2019    Gastroesophageal Reflux He complains of heartburn. He reports no abdominal pain, no chest pain, no choking or no wheezing. This is a recurrent problem. The problem occurs occasionally. Pertinent negatives include no fatigue.  Anxiety Presents for follow-up visit. Patient reports no chest pain, dizziness, nervous/anxious behavior, palpitations or shortness of breath.      Lab Results  Component Value Date   NA 142 11/16/2021   K 4.8 11/16/2021   CO2 27 11/16/2021   GLUCOSE 71 11/16/2021   BUN 21 11/16/2021   CREATININE 1.12 11/16/2021   CALCIUM 9.8 11/16/2021   EGFR 75 11/16/2021   GFRNONAA 67 09/02/2020   Lab Results  Component Value Date   CHOL 187 11/16/2021   HDL 48 11/16/2021   LDLCALC 126 (H) 11/16/2021   TRIG 70 11/16/2021   CHOLHDL 3.9 11/16/2021   Lab Results  Component Value Date   TSH 0.690 11/16/2021   No results found for: "HGBA1C" Lab Results  Component Value Date   WBC 7.6 11/16/2021   HGB 16.9  11/16/2021   HCT 49.2 11/16/2021   MCV 91 11/16/2021   PLT 147 (L) 11/16/2021   Lab Results  Component Value Date   ALT 21 11/16/2021   AST 24 11/16/2021   ALKPHOS 83 11/16/2021   BILITOT 1.7 (H) 11/16/2021   No results found for: "25OHVITD2", "25OHVITD3", "VD25OH"   Review of Systems  Constitutional:  Negative for appetite change, chills, diaphoresis, fatigue and unexpected weight change.  HENT:  Negative for hearing loss, tinnitus, trouble swallowing and voice change.   Eyes:  Negative for visual disturbance.  Respiratory:  Negative for choking, shortness of breath and wheezing.   Cardiovascular:  Negative for chest pain, palpitations and leg swelling.  Gastrointestinal:  Positive for heartburn. Negative for abdominal pain, blood in stool, constipation and diarrhea.  Genitourinary:  Negative for difficulty urinating, dysuria and frequency.  Musculoskeletal:  Negative for arthralgias, back pain and myalgias.  Skin:  Negative for color change and rash.  Neurological:  Negative for dizziness, syncope and headaches.  Hematological:  Negative for adenopathy.  Psychiatric/Behavioral:  Negative for dysphoric mood and sleep disturbance. The patient is not nervous/anxious.     Patient Active Problem List   Diagnosis Date Noted   Osteoarthritis of thumb 12/14/2022   Pallor of extremity 08/30/2022   LBBB (left bundle branch block) 01/11/2022   Hyperlipidemia, mixed 01/11/2022   Lymphedema 08/24/2021   Post-phlebitic syndrome 08/24/2021   Gastroesophageal reflux disease without esophagitis  Personal history of colonic polyps    Current moderate episode of major depressive disorder without prior episode ([facility]) 09/02/2020   Varicose veins of right lower extremity with complications 09/01/2020   Generalized anxiety disorder 08/22/2019   SCCA (squamous cell carcinoma) of skin 02/01/2019   Sleep disturbance 12/18/2018   Benign neoplasm of ascending colon    Adaptive colitis 09/19/2013     Allergies  Allergen Reactions   Azithromycin Hives   Amoxicillin    Clindamycin/Lincomycin Diarrhea   Doxycycline Hives and Diarrhea   Keflex [Cephalexin] Swelling    Past Surgical History:  Procedure Laterality Date   COLONOSCOPY  02/15/11   repeat 10 years   COLONOSCOPY WITH PROPOFOL N/A 08/15/2016   Procedure: COLONOSCOPY WITH PROPOFOL;  Surgeon: Midge Minium, MD;  [facility];  Service: Endoscopy;  Laterality: N/A;   COLONOSCOPY WITH PROPOFOL N/A 09/14/2020   Procedure: COLONOSCOPY WITH BIOPSY;  Surgeon: Midge Minium, MD;  Location: [facility];  Service: Endoscopy;  Laterality: N/A;   ESOPHAGOGASTRODUODENOSCOPY (EGD) WITH PROPOFOL N/A 08/15/2016   Procedure: ESOPHAGOGASTRODUODENOSCOPY (EGD) WITH PROPOFOL;  Surgeon: Midge Minium, MD;  Location: [facility];  Service: Endoscopy;  Laterality: N/A;   ESOPHAGOGASTRODUODENOSCOPY (EGD) WITH PROPOFOL N/A 09/14/2020   Procedure: ESOPHAGOGASTRODUODENOSCOPY (EGD) WITH PROPOFOL;  Surgeon: Midge Minium, MD;  Location[facility];  Service: Endoscopy;  Laterality: N/A;   POLYPECTOMY  08/15/2016   Procedure: POLYPECTOMY;  Surgeon: Midge Minium, MD;  Location: [facility];  Service: Endoscopy;;   POLYPECTOMY N/A 09/14/2020   Procedure: POLYPECTOMY;  Surgeon: Midge Minium, MD;  Location: [facility];  Service: Endoscopy;  Laterality: N/A;    Social History   Tobacco Use   Smoking status: Never    Passive exposure: Never   Smokeless tobacco: Never  Vaping Use   Vaping Use: Never used  Substance Use Topics   Alcohol use: Yes    Alcohol/week: 0.0 standard drinks of alcohol    Comment: occasional   Drug use: No     Medication list has been reviewed and updated.  No outpatient medications have been marked as taking for the 04/26/23 encounter (Appointment) with Reubin Milan, MD.       12/14/2022    1:25 PM 11/14/2022    4:27 PM 07/19/2022   11:34 AM 07/14/2022    4:46 PM  GAD 7  : Generalized Anxiety Score  Nervous, Anxious, on Edge 2 1 1 2   Control/stop worrying 2 1 0 2  Worry too much - different things 2 0 1 2  Trouble relaxing 2 0 0 1  Restless 0 0 0 0  Easily annoyed or irritable 2 0 2 2  Afraid - awful might happen 2 0 2 2  Total GAD 7 Score 12 2 6 11   Anxiety Difficulty Somewhat difficult Not difficult at all Not difficult at all Not difficult at all       12/14/2022    1:24 PM 11/14/2022    4:27 PM 07/19/2022   11:33 AM  Depression screen PHQ 2/9  Decreased Interest 0 0 1  Down, Depressed, Hopeless 1 0 1  PHQ - 2 Score 1 0 2  Altered sleeping 2 0 2  Tired, decreased energy 0 0 2  Change in appetite 1 0 2  Feeling bad or failure about yourself  1 0 2  Trouble concentrating 0 0 0  Moving slowly or fidgety/restless 0 0 0  Suicidal thoughts 0 0 0  PHQ-9 Score 5  0 10  Difficult doing work/chores Somewhat difficult Not difficult at all Not difficult at all    BP Readings from Last 3 Encounters:  12/14/22 124/70  11/14/22 100/70  08/30/22 136/89    Physical Exam Vitals and nursing note reviewed.  Constitutional:      Appearance: Normal appearance. He is well-developed.  HENT:     Head: Normocephalic.     Right Ear: Tympanic membrane, ear canal and external ear normal.     Left Ear: Tympanic membrane, ear canal and external ear normal.     Nose: Nose normal.  Eyes:     Conjunctiva/sclera: Conjunctivae normal.     Pupils: Pupils are equal, round, and reactive to light.  Neck:     Thyroid: No thyromegaly.     Vascular: No carotid bruit.  Cardiovascular:     Rate and Rhythm: Normal rate and regular rhythm.     Heart sounds: Normal heart sounds.  Pulmonary:     Effort: Pulmonary effort is normal.     Breath sounds: Normal breath sounds. No wheezing.  Chest:  Breasts:    Right: No mass.     Left: No mass.  Abdominal:     General: Bowel sounds are normal.     Palpations: Abdomen is soft.     Tenderness: There is no abdominal tenderness.   Musculoskeletal:        General: Normal range of motion.     Cervical back: Normal range of motion and neck supple.  Lymphadenopathy:     Cervical: No cervical adenopathy.  Skin:    General: Skin is warm and dry.     Capillary Refill: Capillary refill takes less than 2 seconds.  Neurological:     General: No focal deficit present.     Mental Status: He is alert and oriented to person, place, and time.     Deep Tendon Reflexes: Reflexes are normal and symmetric.  Psychiatric:        Attention and Perception: Attention normal.        Mood and Affect: Mood normal.        Thought Content: Thought content normal.     Wt Readings from Last 3 Encounters:  12/14/22 263 lb 3.2 oz (119.4 kg)  11/14/22 264 lb (119.7 kg)  08/30/22 257 lb (116.6 kg)    There were no vitals taken for this visit.  Assessment and Plan:  Problem List Items Addressed This Visit     Generalized anxiety disorder (Chronic)    Clinically stable on current regimen with fair control of symptoms, No SI or HI. Using Xanax PRN; no daily medications.       Gastroesophageal reflux disease without esophagitis    Reflux symptoms are minimal on current therapy - Pepcid and antacids. No red flag signs such as weight loss, n/v, melena       Other Visit Diagnoses     Annual physical exam    -  Primary   Prostate cancer screening       Mild hyperlipidemia       Screening for diabetes mellitus           No follow-ups on file.   Partially dictated using Dragon software, any errors are not intentional.  Reubin Milan, MD [facility] [place], [place]

## 2023-04-27 ENCOUNTER — Encounter: Payer: Self-pay | Admitting: Internal Medicine

## 2023-06-19 ENCOUNTER — Ambulatory Visit: Payer: 59 | Admitting: Gastroenterology

## 2023-07-18 ENCOUNTER — Ambulatory Visit: Payer: 59 | Admitting: Urology

## 2023-07-25 ENCOUNTER — Ambulatory Visit: Payer: 59 | Admitting: Urology

## 2023-08-14 ENCOUNTER — Ambulatory Visit (INDEPENDENT_AMBULATORY_CARE_PROVIDER_SITE_OTHER): Payer: 59 | Admitting: Gastroenterology

## 2023-08-14 ENCOUNTER — Encounter: Payer: Self-pay | Admitting: Gastroenterology

## 2023-08-14 VITALS — BP 119/80 | HR 79 | Temp 98.0°F | Ht 71.0 in | Wt 244.0 lb

## 2023-08-14 DIAGNOSIS — K580 Irritable bowel syndrome with diarrhea: Secondary | ICD-10-CM | POA: Diagnosis not present

## 2023-08-14 DIAGNOSIS — R634 Abnormal weight loss: Secondary | ICD-10-CM | POA: Diagnosis not present

## 2023-08-14 DIAGNOSIS — R197 Diarrhea, unspecified: Secondary | ICD-10-CM

## 2023-08-14 MED ORDER — DESIPRAMINE HCL 25 MG PO TABS: 25.0000 mg | ORAL_TABLET | Freq: Every day | ORAL | 2 refills | Status: DC

## 2023-08-14 NOTE — Progress Notes (Signed)
Primary Care Physician: Reubin Milan, MD  Primary Gastroenterologist:  Dr. Midge Minium  Chief Complaint  Patient presents with   Follow-up   Abdominal Pain    LUQ and LLQ intermittent, tender  Increase in gas    Weight Loss   Change in Bowel Habits    Yellow/Orange in color intermittent  Intermittent loose to watery stools    HPI: Derrick Galvan. is a 62 y.o. male here with continued symptoms of irritable bowel syndrome but now reports that he has had a significant amount of weight loss.  The patient has questions about possible bacterial overgrowth.  The patient also reports that he is under a lot of stress.  There is no report of any black or bloody stools.  Past Medical History:  Diagnosis Date   Achilles tendinitis 06/16/2020   Acute gastritis without hemorrhage    Adaptive colitis 09/19/2013   Arthritis    knees   Chronic diarrhea 07/31/2013   DVT (deep venous thrombosis) (HCC) 12/21/2018   LLL   GERD (gastroesophageal reflux disease)    Hepatitis B 1989   resolved   Skin cancer    on face - removed under local    Current Outpatient Medications  Medication Sig Dispense Refill   acetaminophen (TYLENOL) 500 MG tablet Take 1,000 mg by mouth every 6 (six) hours as needed.     albuterol (VENTOLIN HFA) 108 (90 Base) MCG/ACT inhaler Inhale 2 puffs into the lungs every 6 (six) hours as needed for wheezing or shortness of breath. 1 each 0   ALPRAZolam (XANAX) 0.5 MG tablet Take 1 tablet (0.5 mg total) by mouth as needed. 20 tablet 0   bismuth subsalicylate (PEPTO BISMOL) 262 MG/15ML suspension Take 30 mLs by mouth every 6 (six) hours as needed. OTC     calcium carbonate (OS-CAL - DOSED IN MG OF ELEMENTAL CALCIUM) 1250 (500 Ca) MG tablet Take 1 tablet by mouth.     dicyclomine (BENTYL) 20 MG tablet TAKE (1) TABLET BY MOUTH THREE TIMES A DAY BEFORE MEALS. 90 tablet 3   diphenoxylate-atropine (LOMOTIL) 2.5-0.025 MG tablet TAKE (1) TABLET BY MOUTH FOUR TIMES A DAY AS  NEEDED FOR DIARRHEA 120 tablet 5   famotidine (PEPCID) 10 MG tablet Take 10 mg by mouth 2 (two) times daily. OTC     ibuprofen (ADVIL) 600 MG tablet Take 1 tablet (600 mg total) by mouth every 8 (eight) hours as needed. 100 tablet 5   meloxicam (MOBIC) 15 MG tablet Take 1 tablet (15 mg total) by mouth daily. 90 tablet 1   Multiple Vitamin (MULTI-VITAMIN PO) Take by mouth daily.     Probiotic Product (PROBIOTIC PO) Take by mouth.     triamcinolone cream (KENALOG) 0.1 % Apply 1 application topically as needed. HIVES from Azithromy reaction.     No current facility-administered medications for this visit.    Allergies as of 08/14/2023 - Review Complete 08/14/2023  Allergen Reaction Noted   Azithromycin Hives 11/08/2018   Amoxicillin  06/16/2020   Clindamycin/lincomycin Diarrhea 02/01/2019   Doxycycline Hives and Diarrhea 02/01/2019   Keflex [cephalexin] Swelling 01/10/2019    ROS:  General: Negative for anorexia, weight loss, fever, chills, fatigue, weakness. ENT: Negative for hoarseness, difficulty swallowing , nasal congestion. CV: Negative for chest pain, angina, palpitations, dyspnea on exertion, peripheral edema.  Respiratory: Negative for dyspnea at rest, dyspnea on exertion, cough, sputum, wheezing.  GI: See history of present illness. GU:  Negative for dysuria, hematuria,  urinary incontinence, urinary frequency, nocturnal urination.  Endo: Negative for unusual weight change.    Physical Examination:   BP 119/80 (BP Location: Left Arm, Patient Position: Sitting, Cuff Size: Large)   Pulse 79   Temp 98 F (36.7 C) (Oral)   Ht 5\' 11"  (1.803 m)   Wt 244 lb (110.7 kg)   BMI 34.03 kg/m   General: Well-nourished, well-developed in no acute distress.  Eyes: No icterus. Conjunctivae pink. Neuro: Alert and oriented x 3.  Grossly intact. Skin: Warm and dry, no jaundice.   Psych: Alert and cooperative, normal mood and affect.  Labs:    Imaging Studies: No results  found.  Assessment and Plan:   Derrick Galvan. is a 62 y.o. y/o male who comes in with a history of irritable bowel syndrome with recent weight loss.  He states he has lost weight because he is holding off on eating due to the fear of diarrhea.  The patient will have his stool sent off for fecal calprotectin.  The patient also had blood work for sprue panel and will also have pancreatic elastase sent off for possible insufficiency.  The patient has been explained the plan and agrees with it.    Midge Minium, MD. Clementeen Graham    Note: This dictation was prepared with Dragon dictation along with smaller phrase technology. Any transcriptional errors that result from this process are unintentional.

## 2023-08-16 LAB — CELIAC DISEASE PANEL
IgA/Immunoglobulin A, Serum: 155 mg/dL (ref 61–437)
Transglutaminase IgA: <2 U/mL (ref 0–3)

## 2023-08-20 ENCOUNTER — Encounter: Payer: Self-pay | Admitting: Gastroenterology

## 2023-08-20 DIAGNOSIS — R634 Abnormal weight loss: Secondary | ICD-10-CM

## 2023-08-20 DIAGNOSIS — R109 Unspecified abdominal pain: Secondary | ICD-10-CM

## 2023-08-21 NOTE — Addendum Note (Signed)
Addended by: Roena Malady on: 08/21/2023 04:45 PM   Modules accepted: Orders

## 2023-08-22 ENCOUNTER — Telehealth: Payer: Self-pay

## 2023-08-22 NOTE — Telephone Encounter (Signed)
Patient  in office he can now pay for the test that MD ask about last week. Pt states it's a test you breath into. He said he can now pay. He would like to have it done. He thinks it's call Pancreatic enzyme but not certain for sure.   [phone]

## 2023-08-22 NOTE — Addendum Note (Signed)
Addended by: Roena Malady on: 08/22/2023 10:08 AM   Modules accepted: Orders

## 2023-08-24 LAB — CALPROTECTIN, FECAL: Calprotectin, Fecal: 20 ug/g (ref 0–120)

## 2023-08-24 LAB — PANCREATIC ELASTASE, FECAL: Pancreatic Elastase, Fecal: 473 ug Elast./g (ref >200)

## 2023-08-24 NOTE — Telephone Encounter (Signed)
Has been faxed to [facility]

## 2023-08-29 ENCOUNTER — Ambulatory Visit (INDEPENDENT_AMBULATORY_CARE_PROVIDER_SITE_OTHER): Payer: 59 | Admitting: Vascular Surgery

## 2023-08-29 LAB — BASIC METABOLIC PANEL
BUN/Creatinine Ratio: 20 (ref 10–24)
BUN: 20 mg/dL (ref 8–27)
CO2: 24 mmol/L (ref 20–29)
Calcium: 9 mg/dL (ref 8.6–10.2)
Chloride: 106 mmol/L (ref 96–106)
Creatinine, Ser: 1 mg/dL (ref 0.76–1.27)
Glucose: 80 mg/dL (ref 70–99)
Potassium: 4.4 mmol/L (ref 3.5–5.2)
Sodium: 141 mmol/L (ref 134–144)
eGFR: 86 mL/min/{1.73_m2} (ref >59)

## 2023-08-30 ENCOUNTER — Ambulatory Visit
Admission: RE | Admit: 2023-08-30 | Discharge: 2023-08-30 | Disposition: A | Discharge status: Home / Self Care | Payer: 59 | Source: Ambulatory Visit | Attending: Gastroenterology | Admitting: Gastroenterology

## 2023-08-30 DIAGNOSIS — R634 Abnormal weight loss: Secondary | ICD-10-CM | POA: Diagnosis present

## 2023-08-30 DIAGNOSIS — R109 Unspecified abdominal pain: Secondary | ICD-10-CM | POA: Insufficient documentation

## 2023-08-30 MED ORDER — IOHEXOL 300 MG/ML  SOLN
100.0000 mL | Freq: Once | INTRAMUSCULAR | Status: AC | PRN
  Administered 2023-08-30: 100 mL via INTRAVENOUS

## 2023-09-07 ENCOUNTER — Ambulatory Visit: Payer: 59 | Admitting: Gastroenterology

## 2023-09-26 ENCOUNTER — Ambulatory Visit (INDEPENDENT_AMBULATORY_CARE_PROVIDER_SITE_OTHER): Payer: 59 | Admitting: Vascular Surgery

## 2023-10-03 ENCOUNTER — Other Ambulatory Visit: Payer: Self-pay | Admitting: Internal Medicine

## 2023-10-03 DIAGNOSIS — K589 Irritable bowel syndrome without diarrhea: Secondary | ICD-10-CM

## 2023-10-03 NOTE — Telephone Encounter (Signed)
Medication Refill -  Most Recent Primary Care Visit:  Provider: Reubin Milan  Department: [facility]  Visit Type: OFFICE VISIT  Date: 12/14/2022  Medication: diphenoxylate-atropine (LOMOTIL) 2.5-0.025 MG tablet   Has the patient contacted their pharmacy? Yes    Is this the correct pharmacy for this prescription? No If no, delete pharmacy and type the correct one.  This is the patient's preferred pharmacy:    [facility] - [place], [place] - [address] Phone: [phone]  Fax: [phone]     Has the prescription been filled recently? No  Is the patient out of the medication? Yes  Has the patient been seen for an appointment in the last year OR does the patient have an upcoming appointment? Yes  Can we respond through MyChart? Yes  Please assist patient further as he uses this on an as need basis and is completely out.

## 2023-10-03 NOTE — Telephone Encounter (Signed)
Requested medication (s) are due for refill today -expired Rx  Requested medication (s) are on the active medication list -yes  Future visit scheduled -yes  Last refill: 08/16/21 #120 5RF  Notes to clinic: non delegated Rx  Requested Prescriptions  Pending Prescriptions Disp Refills   diphenoxylate-atropine (LOMOTIL) 2.5-0.025 MG tablet 120 tablet 5    Sig: TAKE (1) TABLET BY MOUTH FOUR TIMES A DAY AS NEEDED FOR DIARRHEA.     Not Delegated - Gastroenterology:  Antidiarrheals Failed - 10/03/2023 12:17 PM      Failed - This refill cannot be delegated      Passed - Valid encounter within last 12 months    Recent Outpatient Visits           9 months ago Generalized anxiety disorder   [place] [facility] at [facility], Nyoka Cowden, MD   10 months ago COVID-19 virus infection   [place] [facility] at [facility]   1 year ago Upper respiratory tract infection, unspecified type   [facility] at [facility], Nyoka Cowden, MD   1 year ago Acute non-recurrent sinusitis, unspecified location   [facility] at [facility], Ocie Bob, MD   1 year ago Diarrhea of presumed infectious origin   [facility] & [facility] at [facility], Nyoka Cowden, MD       Future Appointments             In 3 months Judithann Graves Nyoka Cowden, MD [facility] Health Primary Care & Sports Medicine at [facility], [facility]               Requested Prescriptions  Pending Prescriptions Disp Refills   diphenoxylate-atropine (LOMOTIL) 2.5-0.025 MG tablet 120 tablet 5    Sig: TAKE (1) TABLET BY MOUTH FOUR TIMES A DAY AS NEEDED FOR DIARRHEA.     Not Delegated - Gastroenterology:  Antidiarrheals Failed - 10/03/2023 12:17 PM      Failed - This refill cannot be delegated      Passed - Valid encounter within last 12 months    Recent  Outpatient Visits           9 months ago Generalized anxiety disorder   [place] [facility] at [facility], Nyoka Cowden, MD   10 months ago COVID-19 virus infection    Primary Care & Sports Medicine at [facility], MD   1 year ago Upper respiratory tract infection, unspecified type   [facility] Primary Care & Sports Medicine at [facility], Nyoka Cowden, MD   1 year ago Acute non-recurrent sinusitis, unspecified location   [facility] Primary Care & Sports Medicine at [facility], Ocie Bob, MD   1 year ago Diarrhea of presumed infectious origin   [facility] & Sports Medicine at [facility], Nyoka Cowden, MD       Future Appointments             In 3 months Judithann Graves, Nyoka Cowden, MD [facility] Health Primary Care & Sports Medicine at [facility], [facility]

## 2023-10-17 ENCOUNTER — Other Ambulatory Visit: Payer: Self-pay | Admitting: Internal Medicine

## 2023-10-17 DIAGNOSIS — K589 Irritable bowel syndrome without diarrhea: Secondary | ICD-10-CM

## 2023-10-17 NOTE — Telephone Encounter (Signed)
Medication Refill -  Most Recent Primary Care Visit:  Provider: Reubin Milan  Department: [facility]  Visit Type: OFFICE VISIT  Date: 12/14/2022  Medication: diphenoxylate-atropine (LOMOTIL) 2.5-0.025 MG tablet   Has the patient contacted their pharmacy? Yes Request timed out, contact pcp  Is this the correct pharmacy for this prescription? Yes  This is the patient's preferred pharmacy: [facility] - [place], [place] - [address] [place] [address] Phone: [phone] Fax: [phone]  Has the prescription been filled recently? Yes  Is the patient out of the medication? Yes  Has the patient been seen for an appointment in the last year OR does the patient have an upcoming appointment? Yes  Can we respond through MyChart? No  Agent: Please be advised that Rx refills may take up to 3 business days. We ask that you follow-up with your pharmacy.

## 2023-10-18 NOTE — Telephone Encounter (Signed)
Requested medication (s) are due for refill today: Yes  Requested medication (s) are on the active medication list: Yes  Last refill:  08/16/21  Future visit scheduled:   Notes to clinic:  Last filled by Dr. Servando Snare.    Requested Prescriptions  Pending Prescriptions Disp Refills   diphenoxylate-atropine (LOMOTIL) 2.5-0.025 MG tablet 120 tablet 5    Sig: TAKE (1) TABLET BY MOUTH FOUR TIMES A DAY AS NEEDED FOR DIARRHEA.     Not Delegated - Gastroenterology:  Antidiarrheals Failed - 10/17/2023 11:54 AM      Failed - This refill cannot be delegated      Passed - Valid encounter within last 12 months    Recent Outpatient Visits           10 months ago Generalized anxiety disorder   [place] [facility] Medicine at [facility], Nyoka Cowden, MD   11 months ago COVID-19 virus infection   [place] [facility] at [facility], MD   1 year ago Upper respiratory tract infection, unspecified type   [facility] Health Primary Care & Sports Medicine at [facility], Nyoka Cowden, MD   1 year ago Acute non-recurrent sinusitis, unspecified location   [facility] Health Primary Care & Sports Medicine at [facility], Ocie Bob, MD   1 year ago Diarrhea of presumed infectious origin   [facility] Primary Care & Sports Medicine at [facility], Nyoka Cowden, MD       Future Appointments             In 3 months Judithann Graves, Nyoka Cowden, MD [facility] Health Primary Care & Sports Medicine at [facility], [facility]

## 2023-10-20 ENCOUNTER — Other Ambulatory Visit: Payer: Self-pay | Admitting: Internal Medicine

## 2023-10-20 DIAGNOSIS — K589 Irritable bowel syndrome without diarrhea: Secondary | ICD-10-CM

## 2023-10-20 NOTE — Telephone Encounter (Signed)
Requested medication (s) are due for refill today - unsure  Requested medication (s) are on the active medication list -yes  Future visit scheduled -yes  Last refill: 08/16/21  Notes to clinic: non delegated Rx- outside provider, duplicate request  Requested Prescriptions  Pending Prescriptions Disp Refills   diphenoxylate-atropine (LOMOTIL) 2.5-0.025 MG tablet 120 tablet 5    Sig: TAKE (1) TABLET BY MOUTH FOUR TIMES A DAY AS NEEDED FOR DIARRHEA.     Not Delegated - Gastroenterology:  Antidiarrheals Failed - 10/20/2023  1:20 PM      Failed - This refill cannot be delegated      Passed - Valid encounter within last 12 months    Recent Outpatient Visits           10 months ago Generalized anxiety disorder   [place] [facility] Medicine at [facility], Nyoka Cowden, MD   11 months ago COVID-19 virus infection   [place] [facility] at [facility], MD   1 year ago Upper respiratory tract infection, unspecified type   [facility] Primary Care & Sports Medicine at [facility], Nyoka Cowden, MD   1 year ago Acute non-recurrent sinusitis, unspecified location   [facility] Primary Care & Sports Medicine at [facility], Ocie Bob, MD   1 year ago Diarrhea of presumed infectious origin   [facility] Primary Care & Sports Medicine at [facility], Nyoka Cowden, MD       Future Appointments             In 3 months Judithann Graves Nyoka Cowden, MD [facility] Health Primary Care & Sports Medicine at [facility], [facility]               Requested Prescriptions  Pending Prescriptions Disp Refills   diphenoxylate-atropine (LOMOTIL) 2.5-0.025 MG tablet 120 tablet 5    Sig: TAKE (1) TABLET BY MOUTH FOUR TIMES A DAY AS NEEDED FOR DIARRHEA.     Not Delegated - Gastroenterology:  Antidiarrheals Failed - 10/20/2023  1:20 PM      Failed - This refill cannot be delegated      Passed - Valid encounter within  last 12 months    Recent Outpatient Visits           10 months ago Generalized anxiety disorder   [employer] Primary Care & Sports Medicine at [facility], Nyoka Cowden, MD   11 months ago COVID-19 virus infection   [place] [facility] at [facility], MD   1 year ago Upper respiratory tract infection, unspecified type   [facility] Primary Care & Sports Medicine at [facility], Nyoka Cowden, MD   1 year ago Acute non-recurrent sinusitis, unspecified location   [facility] Primary Care & Sports Medicine at [facility], Ocie Bob, MD   1 year ago Diarrhea of presumed infectious origin   [facility] Primary Care & Sports Medicine at [facility], Nyoka Cowden, MD       Future Appointments             In 3 months Judithann Graves, Nyoka Cowden, MD [facility] Health Primary Care & Sports Medicine at [facility], [facility]

## 2023-10-20 NOTE — Telephone Encounter (Signed)
Medication Refill -  Most Recent Primary Care Visit:  Provider: Reubin Milan  Department: [facility]  Visit Type: OFFICE VISIT  Date: 12/14/2022  Medication: diphenoxylate-atropine (LOMOTIL) 2.5-0.025 MG tablet [2516   Has the patient contacted their pharmacy? Yes (Agent: If no, request that the patient contact the pharmacy for the refill. If patient does not wish to contact the pharmacy document the reason why and proceed with request.) (Agent: If yes, when and what did the pharmacy advise?)  Is this the correct pharmacy for this prescription? Yes If no, delete pharmacy and type the correct one.  This is the patient's preferred pharmacy:  [facility] - [place], [place] - [address] [place] [address] Phone: [phone] Fax: [phone]    Has the prescription been filled recently? No  Is the patient out of the medication? Yes  Has the patient been seen for an appointment in the last year OR does the patient have an upcoming appointment? Yes  Can we respond through MyChart? Yes  Agent: Please be advised that Rx refills may take up to 3 business days. We ask that you follow-up with your pharmacy.

## 2023-10-26 ENCOUNTER — Other Ambulatory Visit: Payer: Self-pay | Admitting: Gastroenterology

## 2023-10-26 DIAGNOSIS — K589 Irritable bowel syndrome without diarrhea: Secondary | ICD-10-CM

## 2023-10-26 MED ORDER — DIPHENOXYLATE-ATROPINE 2.5-0.025 MG PO TABS: ORAL_TABLET | ORAL | 5 refills | Status: DC

## 2023-10-26 NOTE — Telephone Encounter (Signed)
This was filled in 2022... Please advise if okay to refill... If so, please send via e-scribe

## 2023-10-26 NOTE — Addendum Note (Signed)
Addended by: Roena Malady on: 10/26/2023 12:23 PM   Modules accepted: Orders

## 2023-10-26 NOTE — Telephone Encounter (Signed)
Patient called in left a voicemail requesting a medication refill to be sent to [facility] [place]. I called the patient back to inform him we receive his message, and I sent the message to the nurse. The medication is (Lomotil) 2.5-0.025 MG and the pharmacy is [facility] on [address] [place], [place], [place] [address].

## 2023-11-14 ENCOUNTER — Ambulatory Visit (INDEPENDENT_AMBULATORY_CARE_PROVIDER_SITE_OTHER): Payer: 59 | Admitting: Vascular Surgery

## 2023-11-28 ENCOUNTER — Other Ambulatory Visit: Payer: Self-pay | Admitting: Internal Medicine

## 2023-11-28 DIAGNOSIS — M1810 Unilateral primary osteoarthritis of first carpometacarpal joint, unspecified hand: Secondary | ICD-10-CM

## 2023-11-28 NOTE — Telephone Encounter (Signed)
Requested medication (s) are due for refill today - yes  Requested medication (s) are on the active medication list -yes  Future visit scheduled -yes  Last refill: 12/14/22 #90 1RF  Notes to clinic: fails lab protocol- over 1 year-11/16/21  Requested Prescriptions  Pending Prescriptions Disp Refills   meloxicam (MOBIC) 15 MG tablet [Pharmacy Med Name: MELOXICAM 15MG  TABLET] 90 tablet 1    Sig: TAKE (1) TABLET BY MOUTH EVERY DAY     Analgesics:  COX2 Inhibitors Failed - 11/28/2023  2:14 PM      Failed - Manual Review: Labs are only required if the patient has taken medication for more than 8 weeks.      Failed - HGB in normal range and within 360 days    Hemoglobin  Date Value Ref Range Status  11/16/2021 16.9 13.0 - 17.7 g/dL Final         Failed - HCT in normal range and within 360 days    Hematocrit  Date Value Ref Range Status  11/16/2021 49.2 37.5 - 51.0 % Final         Failed - AST in normal range and within 360 days    AST  Date Value Ref Range Status  11/16/2021 24 0 - 40 IU/L Final         Failed - ALT in normal range and within 360 days    ALT  Date Value Ref Range Status  11/16/2021 21 0 - 44 IU/L Final         Passed - Cr in normal range and within 360 days    Creatinine, Ser  Date Value Ref Range Status  08/28/2023 1.00 0.76 - 1.27 mg/dL Final         Passed - eGFR is 30 or above and within 360 days    GFR calc Af Amer  Date Value Ref Range Status  09/02/2020 77 >59 mL/min/1.73 Final    Comment:    **In accordance with recommendations from the NKF-ASN Task force,**   [facility] is in the process of updating its eGFR calculation to the   2021 CKD-EPI creatinine equation that estimates kidney function   without a race variable.    GFR calc non Af Amer  Date Value Ref Range Status  09/02/2020 67 >59 mL/min/1.73 Final   eGFR  Date Value Ref Range Status  08/28/2023 86 >59 mL/min/1.73 Final         Passed - Patient is not pregnant      Passed -  Valid encounter within last 12 months    Recent Outpatient Visits           11 months ago Generalized anxiety disorder   [place] [facility] Medicine at [facility], Nyoka Cowden, MD   1 year ago COVID-19 virus infection    Primary [facility] & Sports Medicine at [facility], MD   1 year ago Upper respiratory tract infection, unspecified type   [facility] Primary Care & Sports Medicine at [facility], Nyoka Cowden, MD   1 year ago Acute non-recurrent sinusitis, unspecified location   [facility] Primary Care & Sports Medicine at [facility], Ocie Bob, MD   1 year ago Diarrhea of presumed infectious origin   [facility] Primary Care & Sports Medicine at [facility], Nyoka Cowden, MD       Future Appointments  In 1 month Judithann Graves, Nyoka Cowden, MD [facility] Health Primary Care & Sports Medicine at [facility], [facility]               Requested Prescriptions  Pending Prescriptions Disp Refills   meloxicam (MOBIC) 15 MG tablet [Pharmacy Med Name: MELOXICAM 15MG  TABLET] 90 tablet 1    Sig: TAKE (1) TABLET BY MOUTH EVERY DAY     Analgesics:  COX2 Inhibitors Failed - 11/28/2023  2:14 PM      Failed - Manual Review: Labs are only required if the patient has taken medication for more than 8 weeks.      Failed - HGB in normal range and within 360 days    Hemoglobin  Date Value Ref Range Status  11/16/2021 16.9 13.0 - 17.7 g/dL Final         Failed - HCT in normal range and within 360 days    Hematocrit  Date Value Ref Range Status  11/16/2021 49.2 37.5 - 51.0 % Final         Failed - AST in normal range and within 360 days    AST  Date Value Ref Range Status  11/16/2021 24 0 - 40 IU/L Final         Failed - ALT in normal range and within 360 days    ALT  Date Value Ref Range Status  11/16/2021 21 0 - 44 IU/L Final         Passed - Cr in normal range and within 360 days    Creatinine,  Ser  Date Value Ref Range Status  08/28/2023 1.00 0.76 - 1.27 mg/dL Final         Passed - eGFR is 30 or above and within 360 days    GFR calc Af Amer  Date Value Ref Range Status  09/02/2020 77 >59 mL/min/1.73 Final    Comment:    **In accordance with recommendations from the NKF-ASN Task force,**   [facility] is in the process of updating its eGFR calculation to the   2021 CKD-EPI creatinine equation that estimates kidney function   without a race variable.    GFR calc non Af Amer  Date Value Ref Range Status  09/02/2020 67 >59 mL/min/1.73 Final   eGFR  Date Value Ref Range Status  08/28/2023 86 >59 mL/min/1.73 Final         Passed - Patient is not pregnant      Passed - Valid encounter within last 12 months    Recent Outpatient Visits           11 months ago Generalized anxiety disorder   [place] [facility] Medicine at [facility], Nyoka Cowden, MD   1 year ago COVID-19 virus infection   [place] [facility] at [facility], MD   1 year ago Upper respiratory tract infection, unspecified type   [facility] Primary Care & Sports Medicine at [facility], Nyoka Cowden, MD   1 year ago Acute non-recurrent sinusitis, unspecified location   [facility] Primary Care & Sports Medicine at [facility], Ocie Bob, MD   1 year ago Diarrhea of presumed infectious origin   [facility] Primary Care & Sports Medicine at [facility], Nyoka Cowden, MD       Future Appointments             In 1 month Judithann Graves, Nyoka Cowden, MD [facility] Health Primary  Care & Sports Medicine at [facility], [facility]

## 2024-01-19 ENCOUNTER — Ambulatory Visit: Payer: Self-pay | Admitting: Internal Medicine

## 2024-01-19 ENCOUNTER — Encounter: Payer: Self-pay | Admitting: Student

## 2024-01-19 ENCOUNTER — Telehealth: Admitting: Student

## 2024-01-19 DIAGNOSIS — J209 Acute bronchitis, unspecified: Secondary | ICD-10-CM | POA: Diagnosis not present

## 2024-01-19 MED ORDER — OSELTAMIVIR PHOSPHATE 75 MG PO CAPS: 75.0000 mg | ORAL_CAPSULE | Freq: Two times a day (BID) | ORAL | 0 refills | Status: AC

## 2024-01-19 NOTE — Telephone Encounter (Signed)
 Copied from CRM [id number]. Topic: Clinical - Red Word Triage >> Jan 19, 2024  8:20 AM Elle L wrote: Red Word that prompted transfer to Nurse Triage: The patient is having the symptoms of the flu. He had a fever off and on for two weeks, a dry cough, body chills, body aches and pain, and chest congestion. He has been exposed to the flu at work. He states he started getting sick two weeks ago but it worsened on Wednesday.   Chief Complaint: flu-like symptoms Symptoms: fatigue, body aches, fever, chest congestion Frequency: constant Pertinent Negatives: Patient denies chest pain Disposition: [] ED /[] Urgent Care (no appt availability in office) / [x] Appointment(In office/virtual)/ []  [facility]/ [] Home Care/ [] Refused Recommended Disposition /[] [place] [facility]/ []  Follow-up with PCP Additional Notes: Pt endorses having a "regular" cold for 2 weeks, but symptoms worsened 2 days ago. Started having body aches, chills, and fever. States that co workers tested positive for flu last week and had the same symptoms as he. VV scheduled today at 11am  Reason for Disposition  Fever present > 3 days (72 hours)  Answer Assessment - Initial Assessment Questions 1. TYPE of EXPOSURE: "How were you exposed?" (e.g., close contact, not a close contact)     Close contact at work  2. DATE of EXPOSURE: "When did the exposure occur?" (e.g., hour, days, weeks)     Last week  3. PREGNANCY: "Is there any chance you are pregnant?" "When was your last menstrual period?"     N/a  4. HIGH RISK for COMPLICATIONS: "Do you have any heart or lung problems?" "Do you have a weakened immune system?" (e.g., CHF, COPD, asthma, HIV positive, chemotherapy, renal failure, diabetes mellitus, sickle cell anemia)     No  5. SYMPTOMS: "Do you have any symptoms?" (e.g., cough, fever, sore throat, difficulty breathing).     Chest congestion, on/off fevers, body aches, chills  Protocols used: Influenza (Flu)  Exposure-A-AH

## 2024-01-19 NOTE — Telephone Encounter (Signed)
**Note De-identified  [place] Obfuscation** Please advise 

## 2024-01-19 NOTE — Telephone Encounter (Signed)
 Discussed flue test results that was negative for influenza A and B, and that Tamiflu would not help with other strains of viruses. Will continue with supportive care at this time.

## 2024-01-19 NOTE — Progress Notes (Signed)
 Virtual Visit via Video Note  I connected with Derrick Galvan. on 01/19/24 at 11:00 AM EDT by a video enabled telemedicine application and verified that I am speaking with the correct person using two identifiers.  Patient Location: Home Provider Location: Office/Clinic  I discussed the limitations, risks, security, and privacy concerns of performing an evaluation and management service by video and the availability of in person appointments. I also discussed with the patient that there may be a patient responsible charge related to this service. The patient expressed understanding and agreed to proceed.  Subjective: PCP: Reubin Milan, MD  Chief Complaint  Patient presents with   Influenza    Patient experiencing flu like symptoms since 01/17/24. Derrick Galvan has fever, body aches,chest congestion along with nasal congestion, and headache. Derrick Galvan was exposed to flu around a week ago at work.    URI Patient reports subjective fever, mildly productive cough, myalgias, sneezing, watery eyes, and nasal congestion for the past 2 days. Reports exposure to coworkers who have test positive for influenza A about a week ago. Derrick Galvan has not done a home test. Two week ago had a mild non productive cough and sore throat but symptoms significantly worsened 2 days ago in the evening after work. Derrick Galvan report feeling feverish for which Derrick Galvan took tylenol, Derrick Galvan did not check her temperature at that time. Temperature this morning per patient was 96.52F. Also taking Nyquil at night and guaifenesin for cough. Derrick Galvan denies chest pain, shortness of breath, wheezing, lightheaded, or dizziness.   ROS: Per HPI  Current Outpatient Medications:    acetaminophen (TYLENOL) 500 MG tablet, Take 1,000 mg by mouth every 6 (six) hours as needed., Disp: , Rfl:    ibuprofen (ADVIL) 600 MG tablet, Take 1 tablet (600 mg total) by mouth every 8 (eight) hours as needed., Disp: 100 tablet, Rfl: 5   Multiple Vitamin (MULTI-VITAMIN PO), Take by mouth  daily., Disp: , Rfl:    oseltamivir (TAMIFLU) 75 MG capsule, Take 1 capsule (75 mg total) by mouth 2 (two) times daily for 5 days., Disp: 10 capsule, Rfl: 0   Probiotic Product (PROBIOTIC PO), Take by mouth., Disp: , Rfl:    albuterol (VENTOLIN HFA) 108 (90 Base) MCG/ACT inhaler, Inhale 2 puffs into the lungs every 6 (six) hours as needed for wheezing or shortness of breath. (Patient not taking: Reported on 01/19/2024), Disp: 1 each, Rfl: 0   ALPRAZolam (XANAX) 0.5 MG tablet, Take 1 tablet (0.5 mg total) by mouth as needed. (Patient not taking: Reported on 01/19/2024), Disp: 20 tablet, Rfl: 0   bismuth subsalicylate (PEPTO BISMOL) 262 MG/15ML suspension, Take 30 mLs by mouth every 6 (six) hours as needed. OTC (Patient not taking: Reported on 01/19/2024), Disp: , Rfl:    calcium carbonate (OS-CAL - DOSED IN MG OF ELEMENTAL CALCIUM) 1250 (500 Ca) MG tablet, Take 1 tablet by mouth. (Patient not taking: Reported on 01/19/2024), Disp: , Rfl:    desipramine (NORPRAMIN) 25 MG tablet, Take 1 tablet (25 mg total) by mouth at bedtime. (Patient not taking: Reported on 01/19/2024), Disp: 30 tablet, Rfl: 2   dicyclomine (BENTYL) 20 MG tablet, TAKE (1) TABLET BY MOUTH THREE TIMES A DAY BEFORE MEALS. (Patient not taking: Reported on 01/19/2024), Disp: 90 tablet, Rfl: 3   diphenoxylate-atropine (LOMOTIL) 2.5-0.025 MG tablet, TAKE (1) TABLET BY MOUTH FOUR TIMES A DAY AS NEEDED FOR DIARRHEA. (Patient not taking: Reported on 01/19/2024), Disp: 120 tablet, Rfl: 5   famotidine (PEPCID) 10 MG tablet,  Take 10 mg by mouth 2 (two) times daily. OTC (Patient not taking: Reported on 01/19/2024), Disp: , Rfl:    meloxicam (MOBIC) 15 MG tablet, Take 1 tablet (15 mg total) by mouth daily. (Patient not taking: Reported on 01/19/2024), Disp: 90 tablet, Rfl: 1   triamcinolone cream (KENALOG) 0.1 %, Apply 1 application topically as needed. HIVES from Azithromy reaction. (Patient not taking: Reported on 01/19/2024), Disp: , Rfl:    Observations/Objective: There were no vitals filed for this visit. Physical Exam Constitutional:      General: Derrick Galvan is not in acute distress.    Appearance: Normal appearance.  Eyes:     Extraocular Movements: Extraocular movements intact.     Conjunctiva/sclera: Conjunctivae normal.     Pupils: Pupils are equal, round, and reactive to light.  Pulmonary:     Effort: Pulmonary effort is normal. No respiratory distress.     Comments: Speaking in complete sentences, no cough noted during interview Neurological:     General: No focal deficit present.     Mental Status: Derrick Galvan is alert and oriented to person, place, and time. Mental status is at baseline.    Assessment and Plan: Acute bronchitis, unspecified organism Likely due to influenza given history of exposure vs other viral illness. Derrick Galvan did not receive the influenza vaccine this season. Derrick Galvan will check home flu test and if positive can pick up Tamiflu and sx started <48 hours ago. Symptomatic management with tylenol and guaifenesin. Encouraged PO hydration and rest. Return precautions discussed. Derrick Galvan has visit with PCP next week. Work note provided.  Other orders -     Oseltamivir Phosphate; Take 1 capsule (75 mg total) by mouth 2 (two) times daily for 5 days.  Dispense: 10 capsule; Refill: 0   Follow Up Instructions: Return if symptoms worsen or fail to improve.   I discussed the assessment and treatment plan with the patient. The patient was provided an opportunity to ask questions, and all were answered. The patient agreed with the plan and demonstrated an understanding of the instructions.   The patient was advised to call back or seek an in-person evaluation if the symptoms worsen or if the condition fails to improve as anticipated.  The above assessment and management plan was discussed with the patient. The patient verbalized understanding of and has agreed to the management plan.   Quincy Simmonds, MD  I spent 15 minutes  dedicated to the care of this patient via video on the date of this encounter to including face to face time with the patient, and post visit ordering of testing.

## 2024-01-25 ENCOUNTER — Encounter: Payer: Self-pay | Admitting: Internal Medicine

## 2024-01-25 ENCOUNTER — Ambulatory Visit (INDEPENDENT_AMBULATORY_CARE_PROVIDER_SITE_OTHER): Payer: 59 | Admitting: Internal Medicine

## 2024-01-25 VITALS — BP 116/76 | HR 70 | Ht 71.0 in | Wt 247.5 lb

## 2024-01-25 DIAGNOSIS — I700 Atherosclerosis of aorta: Secondary | ICD-10-CM | POA: Diagnosis not present

## 2024-01-25 DIAGNOSIS — Z125 Encounter for screening for malignant neoplasm of prostate: Secondary | ICD-10-CM

## 2024-01-25 DIAGNOSIS — F321 Major depressive disorder, single episode, moderate: Secondary | ICD-10-CM | POA: Diagnosis not present

## 2024-01-25 DIAGNOSIS — M19042 Primary osteoarthritis, left hand: Secondary | ICD-10-CM

## 2024-01-25 DIAGNOSIS — E782 Mixed hyperlipidemia: Secondary | ICD-10-CM | POA: Diagnosis not present

## 2024-01-25 DIAGNOSIS — G8929 Other chronic pain: Secondary | ICD-10-CM

## 2024-01-25 DIAGNOSIS — Z791 Long term (current) use of non-steroidal anti-inflammatories (NSAID): Secondary | ICD-10-CM

## 2024-01-25 DIAGNOSIS — Z0000 Encounter for general adult medical examination without abnormal findings: Secondary | ICD-10-CM | POA: Diagnosis not present

## 2024-01-25 DIAGNOSIS — M25572 Pain in left ankle and joints of left foot: Secondary | ICD-10-CM

## 2024-01-25 DIAGNOSIS — R17 Unspecified jaundice: Secondary | ICD-10-CM

## 2024-01-25 DIAGNOSIS — Z5181 Encounter for therapeutic drug level monitoring: Secondary | ICD-10-CM

## 2024-01-25 DIAGNOSIS — M19041 Primary osteoarthritis, right hand: Secondary | ICD-10-CM

## 2024-01-25 MED ORDER — ATORVASTATIN CALCIUM 10 MG PO TABS: 10.0000 mg | ORAL_TABLET | Freq: Every day | ORAL | 1 refills | Status: DC

## 2024-01-25 MED ORDER — IBUPROFEN 600 MG PO TABS: 600.0000 mg | ORAL_TABLET | Freq: Three times a day (TID) | ORAL | 5 refills | Status: DC | PRN

## 2024-01-25 NOTE — Assessment & Plan Note (Signed)
 No identifiable risk factors for liver disease Will recheck labs CT abd/pelvic 08/2023: Hepatobiliary: The liver is normal in density without suspicious focal abnormality. There is a cyst in the dome of the left hepatic lobe measuring 2.2 cm on image 13/2, unchanged from previous chest CTA. No evidence of gallstones, gallbladder wall thickening or biliary dilatation.

## 2024-01-25 NOTE — Assessment & Plan Note (Addendum)
 LDL is  Lab Results  Component Value Date   LDLCALC 126 (H) 11/16/2021   Current regimen is diet only.  CT last year noted aortic atherosclerosis. Goal LDL is <70.  Patient is agreeable to starting a statin. Recheck labs in 4 mo.

## 2024-01-25 NOTE — Assessment & Plan Note (Addendum)
 Will check lipids - strongly recommend statin therapy - patient is agreeable.

## 2024-01-25 NOTE — Assessment & Plan Note (Signed)
 Currently doing well on no chronic medications.

## 2024-01-25 NOTE — Progress Notes (Signed)
 Date:  01/25/2024   Name:  Derrick Galvan.   DOB:  02/08/1961   MRN:  [id number]   Chief Complaint: Annual Exam Derrick Galvan. is a 63 y.o. male who presents today for his Complete Annual Exam. He feels poorly. He reports exercising none. He reports he is sleeping fairly well. He is recovering from an URI.  Health Maintenance  Topic Date Due   HIV Screening  Never done   Flu Shot  02/05/2024*   COVID-19 Vaccine (5 - 2024-25 season) 02/10/2024*   Zoster (Shingles) Vaccine (1 of 2) 04/26/2024*   Colon Cancer Screening  09/14/2025   DTaP/Tdap/Td vaccine (2 - Td or Tdap) 08/21/2029   Hepatitis C Screening  Completed   HPV Vaccine  Aged Out  *Topic was postponed. The date shown is not the original due date.    Lab Results  Component Value Date   PSA1 1.4 07/27/2022   PSA1 1.1 09/02/2020   PSA1 0.8 08/22/2019    HPI  Review of Systems  Constitutional:  Positive for fatigue. Negative for unexpected weight change.  HENT:  Negative for nosebleeds and trouble swallowing.   Eyes:  Negative for visual disturbance.  Respiratory:  Negative for cough, chest tightness, shortness of breath and wheezing.   Cardiovascular:  Negative for chest pain, palpitations and leg swelling.  Gastrointestinal:  Negative for abdominal pain, constipation and diarrhea.  Genitourinary:  Negative for dysuria and hematuria.  Musculoskeletal:  Positive for arthralgias and gait problem.  Neurological:  Negative for dizziness, weakness, light-headedness and headaches.  Psychiatric/Behavioral:  Positive for sleep disturbance. Negative for dysphoric mood. The patient is not nervous/anxious.      Lab Results  Component Value Date   NA 141 08/28/2023   K 4.4 08/28/2023   CO2 24 08/28/2023   GLUCOSE 80 08/28/2023   BUN 20 08/28/2023   CREATININE 1.00 08/28/2023   CALCIUM 9.0 08/28/2023   EGFR 86 08/28/2023   GFRNONAA 67 09/02/2020   Lab Results  Component Value Date   CHOL 187 11/16/2021   HDL 48  11/16/2021   LDLCALC 126 (H) 11/16/2021   TRIG 70 11/16/2021   CHOLHDL 3.9 11/16/2021   Lab Results  Component Value Date   TSH 0.690 11/16/2021   No results found for: "HGBA1C" Lab Results  Component Value Date   WBC 7.6 11/16/2021   HGB 16.9 11/16/2021   HCT 49.2 11/16/2021   MCV 91 11/16/2021   PLT 147 (L) 11/16/2021   Lab Results  Component Value Date   ALT 21 11/16/2021   AST 24 11/16/2021   ALKPHOS 83 11/16/2021   BILITOT 1.7 (H) 11/16/2021   No results found for: "25OHVITD2", "25OHVITD3", "VD25OH"   Patient Active Problem List   Diagnosis Date Noted   Aortic atherosclerosis (HCC) 01/25/2024   Total bilirubin, elevated 01/25/2024   Acquired pes planus of left foot 03/07/2023   Primary osteoarthritis, left ankle and foot 03/07/2023   Osteoarthritis of thumb 12/14/2022   Pallor of extremity 08/30/2022   LBBB (left bundle branch block) 01/11/2022   Hyperlipidemia, mixed 01/11/2022   Lymphedema 08/24/2021   Post-phlebitic syndrome 08/24/2021   Gastroesophageal reflux disease without esophagitis    History of colonic polyps    Current moderate episode of major depressive disorder without prior episode (HCC) 09/02/2020   Varicose veins of right lower extremity with complications 09/01/2020   Generalized anxiety disorder 08/22/2019   SCCA (squamous cell carcinoma) of skin 02/01/2019   Sleep  disturbance 12/18/2018   Benign neoplasm of ascending colon    Adaptive colitis 09/19/2013    Allergies  Allergen Reactions   Azithromycin Hives   Amoxicillin    Clindamycin/Lincomycin Diarrhea   Doxycycline Hives and Diarrhea   Keflex [Cephalexin] Swelling    Past Surgical History:  Procedure Laterality Date   COLONOSCOPY  02/15/11   repeat 10 years   COLONOSCOPY WITH PROPOFOL N/A 08/15/2016   Procedure: COLONOSCOPY WITH PROPOFOL;  Surgeon: Midge Minium, MD;  Location: [facility];  Service: Endoscopy;  Laterality: N/A;   COLONOSCOPY WITH PROPOFOL N/A  09/14/2020   Procedure: COLONOSCOPY WITH BIOPSY;  Surgeon: Midge Minium, MD;  Location: [facility];  Service: Endoscopy;  Laterality: N/A;   ESOPHAGOGASTRODUODENOSCOPY (EGD) WITH PROPOFOL N/A 08/15/2016   Procedure: ESOPHAGOGASTRODUODENOSCOPY (EGD) WITH PROPOFOL;  Surgeon: Midge Minium, MD;  Location: [facility];  Service: Endoscopy;  Laterality: N/A;   ESOPHAGOGASTRODUODENOSCOPY (EGD) WITH PROPOFOL N/A 09/14/2020   Procedure: ESOPHAGOGASTRODUODENOSCOPY (EGD) WITH PROPOFOL;  Surgeon: Midge Minium, MD;  Location: [facility];  Service: Endoscopy;  Laterality: N/A;   POLYPECTOMY  08/15/2016   Procedure: POLYPECTOMY;  Surgeon: Midge Minium, MD;  Location: [facility];  Service: Endoscopy;;   POLYPECTOMY N/A 09/14/2020   Procedure: POLYPECTOMY;  Surgeon: Midge Minium, MD;  Location: [facility];  Service: Endoscopy;  Laterality: N/A;    Social History   Tobacco Use   Smoking status: Never    Passive exposure: Never   Smokeless tobacco: Never  Vaping Use   Vaping status: Never Used  Substance Use Topics   Alcohol use: Yes    Alcohol/week: 0.0 standard drinks of alcohol    Comment: occasional   Drug use: No     Medication list has been reviewed and updated.  Current Meds  Medication Sig   acetaminophen (TYLENOL) 500 MG tablet Take 1,000 mg by mouth every 6 (six) hours as needed.   atorvastatin (LIPITOR) 10 MG tablet Take 1 tablet (10 mg total) by mouth daily.   Multiple Vitamin (MULTI-VITAMIN PO) Take by mouth daily.   Probiotic Product (PROBIOTIC PO) Take by mouth.   [DISCONTINUED] ibuprofen (ADVIL) 600 MG tablet Take 1 tablet (600 mg total) by mouth every 8 (eight) hours as needed.       01/25/2024    9:36 AM 12/14/2022    1:25 PM 11/14/2022    4:27 PM 07/19/2022   11:34 AM  GAD 7 : Generalized Anxiety Score  Nervous, Anxious, on Edge 2 2 1 1   Control/stop worrying 2 2 1  0  Worry too much - different things 2 2 0 1  Trouble relaxing 1 2 0 0   Restless 0 0 0 0  Easily annoyed or irritable 0 2 0 2  Afraid - awful might happen 1 2 0 2  Total GAD 7 Score 8 12 2 6   Anxiety Difficulty Somewhat difficult Somewhat difficult Not difficult at all Not difficult at all       01/25/2024    9:35 AM 12/14/2022    1:24 PM 11/14/2022    4:27 PM  Depression screen PHQ 2/9  Decreased Interest 1 0 0  Down, Depressed, Hopeless 1 1 0  PHQ - 2 Score 2 1 0  Altered sleeping 0 2 0  Tired, decreased energy 1 0 0  Change in appetite 0 1 0  Feeling bad or failure about yourself  0 1 0  Trouble concentrating 0 0 0  Moving slowly or fidgety/restless 0  0 0  Suicidal thoughts 0 0 0  PHQ-9 Score 3 5 0  Difficult doing work/chores Not difficult at all Somewhat difficult Not difficult at all    BP Readings from Last 3 Encounters:  01/25/24 116/76  08/14/23 119/80  12/14/22 124/70    Physical Exam Vitals and nursing note reviewed.  Constitutional:      Appearance: Normal appearance. He is well-developed.  HENT:     Head: Normocephalic.     Right Ear: Tympanic membrane, ear canal and external ear normal.     Left Ear: Tympanic membrane, ear canal and external ear normal.     Nose: Nose normal.  Eyes:     Conjunctiva/sclera: Conjunctivae normal.     Pupils: Pupils are equal, round, and reactive to light.  Neck:     Thyroid: No thyromegaly.     Vascular: No carotid bruit.  Cardiovascular:     Rate and Rhythm: Normal rate and regular rhythm.     Heart sounds: Normal heart sounds.  Pulmonary:     Effort: Pulmonary effort is normal.     Breath sounds: Normal breath sounds. No wheezing.  Chest:  Breasts:    Right: No mass.     Left: No mass.  Abdominal:     General: Bowel sounds are normal.     Palpations: Abdomen is soft.     Tenderness: There is no abdominal tenderness.  Musculoskeletal:        General: Normal range of motion.     Cervical back: Normal range of motion and neck supple.     Right lower leg: No edema.     Left lower  leg: Edema present.  Lymphadenopathy:     Cervical: No cervical adenopathy.  Skin:    General: Skin is warm and dry.     Findings: Lesion (medial left calf - discoloration and skin thickening) present.  Neurological:     Mental Status: He is alert and oriented to person, place, and time.     Deep Tendon Reflexes: Reflexes are normal and symmetric.  Psychiatric:        Attention and Perception: Attention normal.        Mood and Affect: Mood normal.        Thought Content: Thought content normal.     Wt Readings from Last 3 Encounters:  01/25/24 247 lb 8 oz (112.3 kg)  08/14/23 244 lb (110.7 kg)  12/14/22 263 lb 3.2 oz (119.4 kg)    BP 116/76   Pulse 70   Ht 5\' 11"  (1.803 m)   Wt 247 lb 8 oz (112.3 kg)   SpO2 98%   BMI 34.52 kg/m   Assessment and Plan:  Problem List Items Addressed This Visit       Unprioritized   Current moderate episode of major depressive disorder without prior episode (HCC) (Chronic)   Currently doing well on no chronic medications.       Hyperlipidemia, mixed   LDL is  Lab Results  Component Value Date   LDLCALC 126 (H) 11/16/2021   Current regimen is diet only.  CT last year noted aortic atherosclerosis. Goal LDL is <70.  Patient is agreeable to starting a statin. Recheck labs in 4 mo.       Relevant Medications   atorvastatin (LIPITOR) 10 MG tablet   Other Relevant Orders   Lipid panel   Aortic atherosclerosis (HCC)   Will check lipids - strongly recommend statin therapy - patient is agreeable.  Relevant Medications   atorvastatin (LIPITOR) 10 MG tablet   Other Relevant Orders   Lipid panel   Total bilirubin, elevated   No identifiable risk factors for liver disease Will recheck labs CT abd/pelvic 08/2023: Hepatobiliary: The liver is normal in density without suspicious focal abnormality. There is a cyst in the dome of the left hepatic lobe measuring 2.2 cm on image 13/2, unchanged from previous chest CTA. No evidence of  gallstones, gallbladder wall thickening or biliary dilatation.      Relevant Orders   Comprehensive metabolic panel   Other Visit Diagnoses       Annual physical exam    -  Primary   he is concerned about fatigue/general weakness and wants testosterone checked   Relevant Orders   CBC with Differential/Platelet   Comprehensive metabolic panel   Lipid panel   PSA   Testosterone, Free, Total, SHBG     Prostate cancer screening       CT abd/pelvis 08/2023: Mild enlargement of the prostate gland. will get PSA   Relevant Orders   PSA     Encounter for monitoring chronic NSAID therapy       Relevant Orders   CBC with Differential/Platelet   Comprehensive metabolic panel     Chronic pain of left ankle       Relevant Medications   ibuprofen (ADVIL) 600 MG tablet     Primary osteoarthritis of both hands       Relevant Medications   ibuprofen (ADVIL) 600 MG tablet       Return in about 4 months (around 05/26/2024) for cholesterol.    Reubin Milan, MD [facility] [place]

## 2024-01-31 ENCOUNTER — Encounter: Payer: Self-pay | Admitting: Internal Medicine

## 2024-01-31 LAB — CBC WITH DIFFERENTIAL/PLATELET
Basophils Absolute: 0 10*3/uL (ref 0.0–0.2)
Basos: 1 %
EOS (ABSOLUTE): 0.2 10*3/uL (ref 0.0–0.4)
Eos: 3 %
Hematocrit: 47.5 % (ref 37.5–51.0)
Hemoglobin: 16.2 g/dL (ref 13.0–17.7)
Immature Grans (Abs): 0 10*3/uL (ref 0.0–0.1)
Immature Granulocytes: 0 %
Lymphocytes Absolute: 2.4 10*3/uL (ref 0.7–3.1)
Lymphs: 36 %
MCH: 30.2 pg (ref 26.6–33.0)
MCHC: 34.1 g/dL (ref 31.5–35.7)
MCV: 89 fL (ref 79–97)
Monocytes Absolute: 0.6 10*3/uL (ref 0.1–0.9)
Monocytes: 8 %
Neutrophils Absolute: 3.6 10*3/uL (ref 1.4–7.0)
Neutrophils: 52 %
Platelets: 153 10*3/uL (ref 150–450)
RBC: 5.36 x10E6/uL (ref 4.14–5.80)
RDW: 12.3 % (ref 11.6–15.4)
WBC: 6.8 10*3/uL (ref 3.4–10.8)

## 2024-01-31 LAB — COMPREHENSIVE METABOLIC PANEL
ALT: 34 IU/L (ref 0–44)
AST: 26 IU/L (ref 0–40)
Albumin: 4.2 g/dL (ref 3.9–4.9)
Alkaline Phosphatase: 86 IU/L (ref 44–121)
BUN/Creatinine Ratio: 18 (ref 10–24)
BUN: 18 mg/dL (ref 8–27)
Bilirubin Total: 1.2 mg/dL (ref 0.0–1.2)
CO2: 24 mmol/L (ref 20–29)
Calcium: 9.3 mg/dL (ref 8.6–10.2)
Chloride: 102 mmol/L (ref 96–106)
Creatinine, Ser: 1.01 mg/dL (ref 0.76–1.27)
Globulin, Total: 2.1 g/dL (ref 1.5–4.5)
Glucose: 91 mg/dL (ref 70–99)
Potassium: 4.6 mmol/L (ref 3.5–5.2)
Sodium: 140 mmol/L (ref 134–144)
Total Protein: 6.3 g/dL (ref 6.0–8.5)
eGFR: 84 mL/min/{1.73_m2} (ref >59)

## 2024-01-31 LAB — PSA: Prostate Specific Ag, Serum: 1.2 ng/mL (ref 0.0–4.0)

## 2024-01-31 LAB — TESTOSTERONE, FREE, TOTAL, SHBG
Sex Hormone Binding: 62.4 nmol/L (ref 19.3–76.4)
Testosterone, Free: 5.3 pg/mL — ABNORMAL LOW (ref 6.6–18.1)
Testosterone: 452 ng/dL (ref 264–916)

## 2024-01-31 LAB — LIPID PANEL
Chol/HDL Ratio: 4.9 ratio (ref 0.0–5.0)
Cholesterol, Total: 172 mg/dL (ref 100–199)
HDL: 35 mg/dL — ABNORMAL LOW (ref >39)
LDL Chol Calc (NIH): 119 mg/dL — ABNORMAL HIGH (ref 0–99)
Triglycerides: 99 mg/dL (ref 0–149)
VLDL Cholesterol Cal: 18 mg/dL (ref 5–40)

## 2024-03-26 ENCOUNTER — Encounter (INDEPENDENT_AMBULATORY_CARE_PROVIDER_SITE_OTHER): Payer: Self-pay

## 2024-05-27 ENCOUNTER — Ambulatory Visit (INDEPENDENT_AMBULATORY_CARE_PROVIDER_SITE_OTHER): Admitting: Internal Medicine

## 2024-05-27 ENCOUNTER — Encounter: Payer: Self-pay | Admitting: Internal Medicine

## 2024-05-27 VITALS — BP 110/72 | HR 78 | Ht 71.0 in | Wt 251.0 lb

## 2024-05-27 DIAGNOSIS — Z23 Encounter for immunization: Secondary | ICD-10-CM | POA: Diagnosis not present

## 2024-05-27 DIAGNOSIS — I700 Atherosclerosis of aorta: Secondary | ICD-10-CM

## 2024-05-27 DIAGNOSIS — M170 Bilateral primary osteoarthritis of knee: Secondary | ICD-10-CM | POA: Diagnosis not present

## 2024-05-27 DIAGNOSIS — E782 Mixed hyperlipidemia: Secondary | ICD-10-CM | POA: Diagnosis not present

## 2024-05-27 DIAGNOSIS — F411 Generalized anxiety disorder: Secondary | ICD-10-CM | POA: Diagnosis not present

## 2024-05-27 MED ORDER — MELOXICAM 15 MG PO TABS: 15.0000 mg | ORAL_TABLET | Freq: Every day | ORAL | 5 refills | Status: AC

## 2024-05-27 NOTE — Assessment & Plan Note (Addendum)
 He will begin statin therapy and recheck in 3 months. He had nuclear stress test and ECHO in 2023 - both normal. I recommend Coronary CT self pay

## 2024-05-27 NOTE — Assessment & Plan Note (Addendum)
 LDL is  Lab Results  Component Value Date   LDLCALC 119 (H) 01/25/2024   Current regimen is none - atorvastatin  not started last visit.   Due to aortic atherosclerosis and elevated risk, statin is recommended.  He will start the Rx - he filled it but never took it. Follow up in 3 months for labs Goal LDL is <55.

## 2024-05-27 NOTE — Progress Notes (Signed)
 Date:  05/27/2024   Name:  Derrick Galvan.   DOB:  1961-07-13   MRN:  [id number]   Chief Complaint: Hyperlipidemia  Hyperlipidemia This is a chronic problem. Recent lipid tests were reviewed and are high. Pertinent negatives include no chest pain or shortness of breath. Treatments tried: atorvastatin  was not started after last visit.  Knee Pain  There was no injury mechanism. The pain is moderate. The pain has been Fluctuating since onset. He reports no foreign bodies present. He has tried NSAIDs for the symptoms.    Review of Systems  Constitutional:  Negative for chills, fatigue and unexpected weight change.  HENT:  Negative for nosebleeds.   Eyes:  Negative for visual disturbance.  Respiratory:  Negative for cough, chest tightness, shortness of breath and wheezing.   Cardiovascular:  Negative for chest pain, palpitations and leg swelling.  Gastrointestinal:  Negative for abdominal pain, constipation and diarrhea.  Musculoskeletal:  Positive for arthralgias and gait problem.  Neurological:  Negative for dizziness, weakness, light-headedness and headaches.  Psychiatric/Behavioral:  Negative for dysphoric mood and sleep disturbance. The patient is nervous/anxious.      Lab Results  Component Value Date   NA 140 01/25/2024   K 4.6 01/25/2024   CO2 24 01/25/2024   GLUCOSE 91 01/25/2024   BUN 18 01/25/2024   CREATININE 1.01 01/25/2024   CALCIUM  9.3 01/25/2024   EGFR 84 01/25/2024   GFRNONAA 67 09/02/2020   Lab Results  Component Value Date   CHOL 172 01/25/2024   HDL 35 (L) 01/25/2024   LDLCALC 119 (H) 01/25/2024   TRIG 99 01/25/2024   CHOLHDL 4.9 01/25/2024   Lab Results  Component Value Date   TSH 0.690 11/16/2021   No results found for: HGBA1C Lab Results  Component Value Date   WBC 6.8 01/25/2024   HGB 16.2 01/25/2024   HCT 47.5 01/25/2024   MCV 89 01/25/2024   PLT 153 01/25/2024   Lab Results  Component Value Date   ALT 34 01/25/2024   AST 26  01/25/2024   ALKPHOS 86 01/25/2024   BILITOT 1.2 01/25/2024   No results found for: MARIEN BOLLS, VD25OH   Patient Active Problem List   Diagnosis Date Noted   Primary osteoarthritis of both knees 05/27/2024   Aortic atherosclerosis (HCC) 01/25/2024   Total bilirubin, elevated 01/25/2024   Acquired pes planus of left foot 03/07/2023   Primary osteoarthritis, left ankle and foot 03/07/2023   Osteoarthritis of thumb 12/14/2022   Pallor of extremity 08/30/2022   LBBB (left bundle branch block) 01/11/2022   Hyperlipidemia, mixed 01/11/2022   Lymphedema 08/24/2021   Post-phlebitic syndrome 08/24/2021   Gastroesophageal reflux disease without esophagitis    History of colonic polyps    Current moderate episode of major depressive disorder without prior episode (HCC) 09/02/2020   Varicose veins of right lower extremity with complications 09/01/2020   Generalized anxiety disorder 08/22/2019   SCCA (squamous cell carcinoma) of skin 02/01/2019   Sleep disturbance 12/18/2018   Benign neoplasm of ascending colon    Adaptive colitis 09/19/2013    Allergies  Allergen Reactions   Azithromycin  Hives   Amoxicillin    Clindamycin/Lincomycin Diarrhea   Doxycycline Hives and Diarrhea   Keflex [Cephalexin] Swelling    Past Surgical History:  Procedure Laterality Date   COLONOSCOPY  02/15/11   repeat 10 years   COLONOSCOPY WITH PROPOFOL  N/A 08/15/2016   Procedure: COLONOSCOPY WITH PROPOFOL ;  Surgeon: Rogelia Copping, MD;  Location:  [facility];  Service: Endoscopy;  Laterality: N/A;   COLONOSCOPY WITH PROPOFOL  N/A 09/14/2020   Procedure: COLONOSCOPY WITH BIOPSY;  Surgeon: Jinny Carmine, MD;  Location: [facility];  Service: Endoscopy;  Laterality: N/A;   ESOPHAGOGASTRODUODENOSCOPY (EGD) WITH PROPOFOL  N/A 08/15/2016   Procedure: ESOPHAGOGASTRODUODENOSCOPY (EGD) WITH PROPOFOL ;  Surgeon: Carmine Jinny, MD;  Location: [facility];  Service: Endoscopy;  Laterality:  N/A;   ESOPHAGOGASTRODUODENOSCOPY (EGD) WITH PROPOFOL  N/A 09/14/2020   Procedure: ESOPHAGOGASTRODUODENOSCOPY (EGD) WITH PROPOFOL ;  Surgeon: Jinny Carmine, MD;  Location: [facility];  Service: Endoscopy;  Laterality: N/A;   POLYPECTOMY  08/15/2016   Procedure: POLYPECTOMY;  Surgeon: Carmine Jinny, MD;  Location: [facility];  Service: Endoscopy;;   POLYPECTOMY N/A 09/14/2020   Procedure: POLYPECTOMY;  Surgeon: Jinny Carmine, MD;  Location: [facility];  Service: Endoscopy;  Laterality: N/A;    Social History   Tobacco Use   Smoking status: Never    Passive exposure: Never   Smokeless tobacco: Never  Vaping Use   Vaping status: Never Used  Substance Use Topics   Alcohol use: Yes    Alcohol/week: 0.0 standard drinks of alcohol    Comment: occasional   Drug use: No     Medication list has been reviewed and updated.  Current Meds  Medication Sig   acetaminophen  (TYLENOL ) 500 MG tablet Take 1,000 mg by mouth every 6 (six) hours as needed.   atorvastatin  (LIPITOR) 10 MG tablet Take 1 tablet (10 mg total) by mouth daily.   ibuprofen  (ADVIL ) 600 MG tablet Take 1 tablet (600 mg total) by mouth every 8 (eight) hours as needed.   meloxicam  (MOBIC ) 15 MG tablet Take 1 tablet (15 mg total) by mouth daily.   Multiple Vitamin (MULTI-VITAMIN PO) Take by mouth daily.   Probiotic Product (PROBIOTIC PO) Take by mouth.       05/27/2024   10:53 AM 01/25/2024    9:36 AM 12/14/2022    1:25 PM 11/14/2022    4:27 PM  GAD 7 : Generalized Anxiety Score  Nervous, Anxious, on Edge 3 2 2 1   Control/stop worrying 3 2 2 1   Worry too much - different things 3 2 2  0  Trouble relaxing 3 1 2  0  Restless 3 0 0 0  Easily annoyed or irritable 3 0 2 0  Afraid - awful might happen 3 1 2  0  Total GAD 7 Score 21 8 12 2   Anxiety Difficulty Extremely difficult Somewhat difficult Somewhat difficult Not difficult at all       05/27/2024   10:52 AM 01/25/2024    9:35 AM 12/14/2022    1:24 PM   Depression screen PHQ 2/9  Decreased Interest 3 1 0  Down, Depressed, Hopeless 3 1 1   PHQ - 2 Score 6 2 1   Altered sleeping 1 0 2  Tired, decreased energy 3 1 0  Change in appetite 0 0 1  Feeling bad or failure about yourself  2 0 1  Trouble concentrating 2 0 0  Moving slowly or fidgety/restless 2 0 0  Suicidal thoughts 0 0 0  PHQ-9 Score 16 3 5   Difficult doing work/chores Somewhat difficult Not difficult at all Somewhat difficult    BP Readings from Last 3 Encounters:  05/27/24 110/72  01/25/24 116/76  08/14/23 119/80    Physical Exam Vitals and nursing note reviewed.  Constitutional:      General: He is not in acute distress.    Appearance: He  is well-developed.  HENT:     Head: Normocephalic and atraumatic.  Pulmonary:     Effort: Pulmonary effort is normal. No respiratory distress.  Musculoskeletal:     Cervical back: Normal range of motion.  Skin:    General: Skin is warm and dry.     Findings: No rash.  Neurological:     Mental Status: He is alert and oriented to person, place, and time.  Psychiatric:        Mood and Affect: Mood normal.        Behavior: Behavior normal.     Wt Readings from Last 3 Encounters:  05/27/24 251 lb (113.9 kg)  01/25/24 247 lb 8 oz (112.3 kg)  08/14/23 244 lb (110.7 kg)    BP 110/72   Pulse 78   Ht 5' 11 (1.803 m)   Wt 251 lb (113.9 kg)   SpO2 98%   BMI 35.01 kg/m   Assessment and Plan:  Problem List Items Addressed This Visit       Unprioritized   Generalized anxiety disorder (Chronic)   He uses Xanax  rarely - he has #1- tabs remaining from prescription 18 mo ago. No refill needed today.      Hyperlipidemia, mixed (Chronic)   LDL is  Lab Results  Component Value Date   LDLCALC 119 (H) 01/25/2024   Current regimen is none - atorvastatin  not started last visit.   Due to aortic atherosclerosis and elevated risk, statin is recommended.  He will start the Rx - he filled it but never took it. Follow up in 3  months for labs Goal LDL is <55.       Aortic atherosclerosis (HCC) - Primary   He will begin statin therapy and recheck in 3 months. He had nuclear stress test and ECHO in 2023 - both normal. I recommend Coronary CT self pay      Relevant Orders   CT CARDIAC SCORING (SELF PAY ONLY)   Primary osteoarthritis of both knees   Will continue Mobic  15 mg daily PRN Discontinue Ibuprofen .      Relevant Medications   meloxicam  (MOBIC ) 15 MG tablet    Return in about 3 months (around 08/27/2024), or lipids, testosterone , Shingrix  #2.    Leita HILARIO Adie, MD [facility] [place]

## 2024-05-27 NOTE — Assessment & Plan Note (Signed)
 Will continue Mobic  15 mg daily PRN Discontinue Ibuprofen .

## 2024-05-27 NOTE — Assessment & Plan Note (Signed)
 He uses Xanax  rarely - he has #1- tabs remaining from prescription 18 mo ago. No refill needed today.

## 2024-07-15 ENCOUNTER — Ambulatory Visit: Payer: Self-pay

## 2024-07-15 ENCOUNTER — Ambulatory Visit (INDEPENDENT_AMBULATORY_CARE_PROVIDER_SITE_OTHER): Admitting: Internal Medicine

## 2024-07-15 ENCOUNTER — Encounter: Payer: Self-pay | Admitting: Internal Medicine

## 2024-07-15 VITALS — BP 122/74 | HR 78 | Ht 71.0 in | Wt 249.0 lb

## 2024-07-15 DIAGNOSIS — R21 Rash and other nonspecific skin eruption: Secondary | ICD-10-CM

## 2024-07-15 MED ORDER — OXYCODONE-ACETAMINOPHEN 10-325 MG PO TABS: 1.0000 | ORAL_TABLET | Freq: Three times a day (TID) | ORAL | 0 refills | Status: AC | PRN

## 2024-07-15 MED ORDER — SULFAMETHOXAZOLE-TRIMETHOPRIM 800-160 MG PO TABS: 1.0000 | ORAL_TABLET | Freq: Two times a day (BID) | ORAL | 0 refills | Status: AC

## 2024-07-15 NOTE — Telephone Encounter (Signed)
 FYI Only or Action Required?: FYI only for provider.  Patient was last seen in primary care on 05/27/2024 by Justus Leita DEL, MD.  Called Nurse Triage reporting Rash and Pain.  Symptoms began several days ago.  Interventions attempted: OTC medications: ibuprofen  and Prescription medications: Diflucan and hydrocortisone ointment.  Symptoms are: unchanged.  Triage Disposition: See Physician Within 24 Hours  Patient/caregiver understands and will follow disposition?: Yes                             Copied from CRM [id number]. Topic: Clinical - Red Word Triage >> Jul 15, 2024  8:19 AM Gennette ORN wrote: Red Word that prompted transfer to Nurse Triage: Patient is calling because he in a lot of severe pain since last week. He has a rash in by his inner thigh and very tender and burning. Reason for Disposition  [1] Localized rash is very painful AND [2] no fever  Answer Assessment - Initial Assessment Questions 1. APPEARANCE of RASH: What does the rash look like? (e.g., blisters, dry flaky skin, red spots, redness, sores)     Red patch 2. LOCATION: Where is the rash located?      Bilateral inner thigh and groin 4. SIZE: How big are the spots? (e.g., inches, cm; or compare to size of pinhead, tip of pen, eraser, pea)      Several inches  5. ONSET: When did the rash start?      Last week  6. ITCHING: Does the rash itch? If Yes, ask: How bad is the itch?  (Scale 0-10; or none, mild, moderate, severe)     Denies  7. PAIN: Does the rash hurt? If Yes, ask: How bad is the pain?  (Scale 0-10; or none, mild, moderate, severe)     Rates pain a 9 8. OTHER SYMPTOMS: Do you have any other symptoms? (e.g., fever)     Denies fever, denies difficulty breathing, denies facial swelling    Patient stated he was seen for an OV on Thursday of last week. Patient states Diflucan and hydrocortisone ointment are not providing relief.  Protocols used: Rash or  Redness - Localized-A-AH

## 2024-07-15 NOTE — Patient Instructions (Signed)
 Stop all topical agents - can use cool compresses if helpful  See Dermatology as soon as possible.  Do not drive while taking Percocet.

## 2024-07-15 NOTE — Progress Notes (Signed)
 Date:  07/15/2024   Name:  Derrick Galvan.   DOB:  November 24, 1960   MRN:  [id number]   Chief Complaint: Rash (Rash in pelvic area- Painful. Hurts to walk. X 1 week. Tried hydrocortisone crean and fluconazole. )  Rash This is a new problem. The current episode started in the past 7 days. The problem has been gradually worsening since onset. The affected locations include the groin. Pertinent negatives include no fatigue, fever or shortness of breath.    Review of Systems  Constitutional:  Negative for fatigue and fever.  Respiratory:  Negative for chest tightness and shortness of breath.   Cardiovascular:  Negative for chest pain.  Skin:  Positive for color change and rash.     Lab Results  Component Value Date   NA 140 01/25/2024   K 4.6 01/25/2024   CO2 24 01/25/2024   GLUCOSE 91 01/25/2024   BUN 18 01/25/2024   CREATININE 1.01 01/25/2024   CALCIUM  9.3 01/25/2024   EGFR 84 01/25/2024   GFRNONAA 67 09/02/2020   Lab Results  Component Value Date   CHOL 172 01/25/2024   HDL 35 (L) 01/25/2024   LDLCALC 119 (H) 01/25/2024   TRIG 99 01/25/2024   CHOLHDL 4.9 01/25/2024   Lab Results  Component Value Date   TSH 0.690 11/16/2021   No results found for: HGBA1C Lab Results  Component Value Date   WBC 6.8 01/25/2024   HGB 16.2 01/25/2024   HCT 47.5 01/25/2024   MCV 89 01/25/2024   PLT 153 01/25/2024   Lab Results  Component Value Date   ALT 34 01/25/2024   AST 26 01/25/2024   ALKPHOS 86 01/25/2024   BILITOT 1.2 01/25/2024   No results found for: MARIEN BOLLS, VD25OH   Patient Active Problem List   Diagnosis Date Noted   Primary osteoarthritis of both knees 05/27/2024   Aortic atherosclerosis (HCC) 01/25/2024   Total bilirubin, elevated 01/25/2024   Acquired pes planus of left foot 03/07/2023   Primary osteoarthritis, left ankle and foot 03/07/2023   Osteoarthritis of thumb 12/14/2022   Pallor of extremity 08/30/2022   LBBB (left bundle branch  block) 01/11/2022   Hyperlipidemia, mixed 01/11/2022   Lymphedema 08/24/2021   Post-phlebitic syndrome 08/24/2021   Gastroesophageal reflux disease without esophagitis    History of colonic polyps    Current moderate episode of major depressive disorder without prior episode (HCC) 09/02/2020   Varicose veins of right lower extremity with complications 09/01/2020   Generalized anxiety disorder 08/22/2019   SCCA (squamous cell carcinoma) of skin 02/01/2019   Sleep disturbance 12/18/2018   Benign neoplasm of ascending colon    Adaptive colitis 09/19/2013    Allergies  Allergen Reactions   Azithromycin  Hives   Amoxicillin    Clindamycin/Lincomycin Diarrhea   Doxycycline Hives and Diarrhea   Keflex [Cephalexin] Swelling    Past Surgical History:  Procedure Laterality Date   COLONOSCOPY  02/15/11   repeat 10 years   COLONOSCOPY WITH PROPOFOL  N/A 08/15/2016   Procedure: COLONOSCOPY WITH PROPOFOL ;  Surgeon: Rogelia Copping, MD;  Location: [facility];  Service: Endoscopy;  Laterality: N/A;   COLONOSCOPY WITH PROPOFOL  N/A 09/14/2020   Procedure: COLONOSCOPY WITH BIOPSY;  Surgeon: Copping Rogelia, MD;  Location: [facility];  Service: Endoscopy;  Laterality: N/A;   ESOPHAGOGASTRODUODENOSCOPY (EGD) WITH PROPOFOL  N/A 08/15/2016   Procedure: ESOPHAGOGASTRODUODENOSCOPY (EGD) WITH PROPOFOL ;  Surgeon: Rogelia Copping, MD;  Location: [facility];  Service: Endoscopy;  Laterality:  N/A;   ESOPHAGOGASTRODUODENOSCOPY (EGD) WITH PROPOFOL  N/A 09/14/2020   Procedure: ESOPHAGOGASTRODUODENOSCOPY (EGD) WITH PROPOFOL ;  Surgeon: Jinny Carmine, MD;  Location: [facility];  Service: Endoscopy;  Laterality: N/A;   POLYPECTOMY  08/15/2016   Procedure: POLYPECTOMY;  Surgeon: Carmine Jinny, MD;  Location: [facility];  Service: Endoscopy;;   POLYPECTOMY N/A 09/14/2020   Procedure: POLYPECTOMY;  Surgeon: Jinny Carmine, MD;  Location: [facility];  Service: Endoscopy;  Laterality: N/A;     Social History   Tobacco Use   Smoking status: Never    Passive exposure: Never   Smokeless tobacco: Never  Vaping Use   Vaping status: Never Used  Substance Use Topics   Alcohol use: Yes    Alcohol/week: 0.0 standard drinks of alcohol    Comment: occasional   Drug use: No     Medication list has been reviewed and updated.  Current Meds  Medication Sig   acetaminophen  (TYLENOL ) 500 MG tablet Take 1,000 mg by mouth every 6 (six) hours as needed.   atorvastatin  (LIPITOR) 10 MG tablet Take 1 tablet (10 mg total) by mouth daily.   fluconazole (DIFLUCAN) 200 MG tablet Take 200 mg by mouth daily.   hydrocortisone cream 1 % Apply 1 Application topically 2 (two) times daily.   meloxicam  (MOBIC ) 15 MG tablet Take 1 tablet (15 mg total) by mouth daily.   Multiple Vitamin (MULTI-VITAMIN PO) Take by mouth daily.   oxyCODONE -acetaminophen  (PERCOCET) 10-325 MG tablet Take 1 tablet by mouth every 8 (eight) hours as needed for up to 5 days for pain.   Probiotic Product (PROBIOTIC PO) Take by mouth.   sulfamethoxazole -trimethoprim  (BACTRIM  DS) 800-160 MG tablet Take 1 tablet by mouth 2 (two) times daily for 10 days.       05/27/2024   10:53 AM 01/25/2024    9:36 AM 12/14/2022    1:25 PM 11/14/2022    4:27 PM  GAD 7 : Generalized Anxiety Score  Nervous, Anxious, on Edge 3 2 2 1   Control/stop worrying 3 2 2 1   Worry too much - different things 3 2 2  0  Trouble relaxing 3 1 2  0  Restless 3 0 0 0  Easily annoyed or irritable 3 0 2 0  Afraid - awful might happen 3 1 2  0  Total GAD 7 Score 21 8 12 2   Anxiety Difficulty Extremely difficult Somewhat difficult Somewhat difficult Not difficult at all       07/15/2024    9:11 AM 05/27/2024   10:52 AM 01/25/2024    9:35 AM  Depression screen PHQ 2/9  Decreased Interest 3 3 1   Down, Depressed, Hopeless 3 3 1   PHQ - 2 Score 6 6 2   Altered sleeping 1 1 0  Tired, decreased energy 3 3 1   Change in appetite 0 0 0  Feeling bad or failure about  yourself  2 2 0  Trouble concentrating 2 2 0  Moving slowly or fidgety/restless 2 2 0  Suicidal thoughts 0 0 0  PHQ-9 Score 16 16 3   Difficult doing work/chores Very difficult Somewhat difficult Not difficult at all    BP Readings from Last 3 Encounters:  07/15/24 122/74  05/27/24 110/72  01/25/24 116/76    Physical Exam Vitals and nursing note reviewed.  Constitutional:      General: He is in acute distress.     Appearance: He is well-developed.  HENT:     Head: Normocephalic and atraumatic.  Cardiovascular:  Rate and Rhythm: Normal rate and regular rhythm.  Pulmonary:     Effort: Pulmonary effort is normal. No respiratory distress.  Skin:    General: Skin is warm and dry.     Findings: Rash present.         Comments: Bright red rash with yellow exudate, no weeping or bleeding Very painful to touch  Neurological:     Mental Status: He is alert and oriented to person, place, and time.  Psychiatric:        Mood and Affect: Mood normal.        Behavior: Behavior normal.     Wt Readings from Last 3 Encounters:  07/15/24 249 lb (112.9 kg)  05/27/24 251 lb (113.9 kg)  01/25/24 247 lb 8 oz (112.3 kg)    BP 122/74   Pulse 78   Ht 5' 11 (1.803 m)   Wt 249 lb (112.9 kg)   SpO2 99%   BMI 34.73 kg/m   Assessment and Plan:  Problem List Items Addressed This Visit   None Visit Diagnoses       Rash and nonspecific skin eruption    -  Primary   I suspect bacterial infection on top of fungal finish Diflucan stop all topicals; see Dermatology ASAP   Relevant Medications   sulfamethoxazole -trimethoprim  (BACTRIM  DS) 800-160 MG tablet   oxyCODONE -acetaminophen  (PERCOCET) 10-325 MG tablet       No follow-ups on file.    Leita HILARIO Adie, MD [facility] [place]

## 2024-07-17 ENCOUNTER — Encounter: Payer: Self-pay | Admitting: Internal Medicine

## 2024-07-17 NOTE — Telephone Encounter (Signed)
 Please review. Appt?  KP

## 2024-07-18 NOTE — Telephone Encounter (Signed)
 Please review and advise.  JM

## 2024-07-22 ENCOUNTER — Ambulatory Visit: Admitting: Internal Medicine

## 2024-07-22 ENCOUNTER — Ambulatory Visit: Payer: Self-pay

## 2024-07-22 NOTE — Telephone Encounter (Signed)
 FYI Only or Action Required?: FYI only for provider.  Patient was last seen in primary care on 07/15/2024 by Justus Leita DEL, MD.  Called Nurse Triage reporting Groin Pain and Rash.  Symptoms began several weeks ago.  Interventions attempted: Prescription medications: mometasone, bactrim , oxycodone .  Symptoms are: unchanged.  Triage Disposition: See PCP When Office is Open (Within 3 Days)  Patient/caregiver understands and will follow disposition?: Yes      Copied from CRM #[id number]. Topic: Clinical - Red Word Triage >> Jul 22, 2024  8:22 AM Larissa RAMAN wrote: [facility] that prompted transfer to Nurse Triage: pain with rash- Groin area Reason for Disposition  [1] Using home treatment > 1 week for jock itch AND [2] not improved  Answer Assessment - Initial Assessment Questions 1. LOCATION and RADIATION: Where is the pain located?      Groin Recent PCP visit - requesting re-evaluation PCP and derm reports yeast infection 2. QUALITY: What does the pain feel like?  (e.g., sharp, dull, aching, burning)     Endorses chafing 3. SEVERITY: How bad is the pain?  (Scale 1-10; or mild, moderate, severe)   - MILD (1-3): doesn't interfere with normal activities    - MODERATE (4-7): interferes with normal activities (e.g., work or school) or awakens from sleep   - SEVERE (8-10): excruciating pain, unable to do any normal activities, difficulty walking     6-7/10 when not walking 4. ONSET: When did the pain start?     weeks 5. PATTERN: Does it come and go, or has it been constant since it started?     constant 6. SCROTAL APPEARANCE: What does the scrotum look like? Is there any swelling or redness?      N/a 7. HERNIA: Has a doctor ever told you that you have a hernia?     N/a 8. OTHER SYMPTOMS: Do you have any other symptoms? (e.g., fever, abdominal pain, vomiting, difficulty passing urine)     Rash - yeast infection  Protocols used: Scrotal Pain-A-AH, Jock  Itch-A-AH

## 2024-07-22 NOTE — Telephone Encounter (Signed)
 Noted  Pt has appt.  KP

## 2024-08-21 ENCOUNTER — Telehealth (INDEPENDENT_AMBULATORY_CARE_PROVIDER_SITE_OTHER): Payer: Self-pay

## 2024-08-21 NOTE — Telephone Encounter (Signed)
 Patient called into nurse line states he has had prior DVT in leg thinks one has been developing in the right has been on medical leave past two weeks has been sedentary at home woke up this morning with pain in right calf does not feel muscular is not warm to the touch feels like muscle is contracting when standing is not red or angry looking, he states it feels like id did with prior DVT on the left leg. He further states with being sedentary Left leg appears to be swelling as well. Advised patient I would forward this information along to see his next steps.

## 2024-08-22 ENCOUNTER — Other Ambulatory Visit (INDEPENDENT_AMBULATORY_CARE_PROVIDER_SITE_OTHER): Payer: Self-pay | Admitting: Vascular Surgery

## 2024-08-22 ENCOUNTER — Ambulatory Visit (INDEPENDENT_AMBULATORY_CARE_PROVIDER_SITE_OTHER): Admitting: Vascular Surgery

## 2024-08-22 ENCOUNTER — Encounter (INDEPENDENT_AMBULATORY_CARE_PROVIDER_SITE_OTHER): Payer: Self-pay | Admitting: Vascular Surgery

## 2024-08-22 ENCOUNTER — Ambulatory Visit (INDEPENDENT_AMBULATORY_CARE_PROVIDER_SITE_OTHER)

## 2024-08-22 VITALS — BP 115/76 | HR 89 | Resp 18 | Ht 71.0 in | Wt 259.2 lb

## 2024-08-22 DIAGNOSIS — I82431 Acute embolism and thrombosis of right popliteal vein: Secondary | ICD-10-CM | POA: Diagnosis not present

## 2024-08-22 DIAGNOSIS — I890 Lymphedema, not elsewhere classified: Secondary | ICD-10-CM

## 2024-08-22 DIAGNOSIS — M7989 Other specified soft tissue disorders: Secondary | ICD-10-CM

## 2024-08-22 DIAGNOSIS — K219 Gastro-esophageal reflux disease without esophagitis: Secondary | ICD-10-CM | POA: Diagnosis not present

## 2024-08-22 DIAGNOSIS — Z86718 Personal history of other venous thrombosis and embolism: Secondary | ICD-10-CM | POA: Diagnosis not present

## 2024-08-22 DIAGNOSIS — M170 Bilateral primary osteoarthritis of knee: Secondary | ICD-10-CM | POA: Diagnosis not present

## 2024-08-22 DIAGNOSIS — E782 Mixed hyperlipidemia: Secondary | ICD-10-CM

## 2024-08-22 DIAGNOSIS — M79661 Pain in right lower leg: Secondary | ICD-10-CM | POA: Diagnosis not present

## 2024-08-22 NOTE — Progress Notes (Signed)
 MRN : [id number]  Derrick Galvan. is a 63 y.o. (September 12, 1961) male who presents with chief complaint of legs hurt and swell.  History of Present Illness:   The patient presents to the office for evaluation of DVT.  The initial symptoms were pain and swelling in the right lower extremity.  This began approximately 2 days ago.  He was concerned since he had had a left lower extremity DVT in the past and had similar presenting symptoms  Symptoms are much better with elevation.  The patient notes minimal edema in the morning which steadily worsens throughout the day.    The patient has not been using compression therapy at this point.  No SOB or pleuritic chest pains.  No cough or hemoptysis.  No history of rest pain symptoms. No new ulcers or wounds of the lower extremities have occurred.  The patient denies amaurosis fugax or recent TIA symptoms. There are no recent neurological changes noted. No recent episodes of angina or shortness of breath documented.   Previous ABIs Rt=1.14 and Lt=1.28 (triphasic waveforms and normal digital pressures and waveforms bilaterally).  Duplex ultrasound of the venous system obtained today demonstrates acute DVT in the right popliteal and superficial femoral vein.  Current Meds  Medication Sig   acetaminophen  (TYLENOL ) 500 MG tablet Take 1,000 mg by mouth every 6 (six) hours as needed.   atorvastatin  (LIPITOR) 10 MG tablet Take 1 tablet (10 mg total) by mouth daily.   clindamycin (CLEOCIN) 300 MG capsule Take 300 mg by mouth 2 (two) times daily.   fluconazole (DIFLUCAN) 200 MG tablet Take 200 mg by mouth daily.   meloxicam  (MOBIC ) 15 MG tablet Take 1 tablet (15 mg total) by mouth daily.   mometasone (ELOCON) 0.1 % ointment Apply topically.   Multiple Vitamin (MULTI-VITAMIN PO) Take by mouth daily.   Probiotic Product (PROBIOTIC PO) Take by mouth.   valACYclovir  (VALTREX ) 1000 MG tablet Take 1,000 mg by mouth 2 (two) times daily.    Past  Medical History:  Diagnosis Date   Achilles tendinitis 06/16/2020   Acute gastritis without hemorrhage    Adaptive colitis 09/19/2013   Arthritis    knees   Chronic diarrhea 07/31/2013   DVT (deep venous thrombosis) (HCC) 12/21/2018   LLL   GERD (gastroesophageal reflux disease)    Hepatitis B 1989   resolved   Skin cancer    on face - removed under local    Past Surgical History:  Procedure Laterality Date   COLONOSCOPY  02/15/11   repeat 10 years   COLONOSCOPY WITH PROPOFOL  N/A 08/15/2016   Procedure: COLONOSCOPY WITH PROPOFOL ;  Surgeon: Rogelia Copping, MD;  Location: [facility];  Service: Endoscopy;  Laterality: N/A;   COLONOSCOPY WITH PROPOFOL  N/A 09/14/2020   Procedure: COLONOSCOPY WITH BIOPSY;  Surgeon: Copping Rogelia, MD;  Location: [facility];  Service: Endoscopy;  Laterality: N/A;   ESOPHAGOGASTRODUODENOSCOPY (EGD) WITH PROPOFOL  N/A 08/15/2016   Procedure: ESOPHAGOGASTRODUODENOSCOPY (EGD) WITH PROPOFOL ;  Surgeon: Rogelia Copping, MD;  Location: [facility];  Service: Endoscopy;  Laterality: N/A;   ESOPHAGOGASTRODUODENOSCOPY (EGD) WITH PROPOFOL  N/A 09/14/2020   Procedure: ESOPHAGOGASTRODUODENOSCOPY (EGD) WITH PROPOFOL ;  Surgeon: Copping Rogelia, MD;  Location: [facility];  Service: Endoscopy;  Laterality: N/A;   POLYPECTOMY  08/15/2016   Procedure: POLYPECTOMY;  Surgeon: Rogelia Copping, MD;  Location: [facility];  Service: Endoscopy;;   POLYPECTOMY N/A 09/14/2020   Procedure: POLYPECTOMY;  Surgeon: Copping Rogelia,  MD;  Location: [facility];  Service: Endoscopy;  Laterality: N/A;    Social History Social History   Tobacco Use   Smoking status: Never    Passive exposure: Never   Smokeless tobacco: Never  Vaping Use   Vaping status: Never Used  Substance Use Topics   Alcohol use: Yes    Alcohol/week: 0.0 standard drinks of alcohol    Comment: occasional   Drug use: No    Family History Family History  Problem Relation Age of Onset    Healthy Mother    COPD Father     Allergies  Allergen Reactions   Azithromycin  Hives   Amoxicillin    Clindamycin/Lincomycin Diarrhea   Doxycycline Hives and Diarrhea   Keflex [Cephalexin] Swelling     REVIEW OF SYSTEMS (Negative unless checked)  Constitutional: [] Weight loss  [] Fever  [] Chills Cardiac: [] Chest pain   [] Chest pressure   [] Palpitations   [] Shortness of breath when laying flat   [] Shortness of breath with exertion. Vascular:  [] Pain in legs with walking   [x] Pain in legs at rest  [] History of DVT   [] Phlebitis   [x] Swelling in legs   [] Varicose veins   [] Non-healing ulcers Pulmonary:   [] Uses home oxygen   [] Productive cough   [] Hemoptysis   [] Wheeze  [] COPD   [] Asthma Neurologic:  [] Dizziness   [] Seizures   [] History of stroke   [] History of TIA  [] Aphasia   [] Vissual changes   [] Weakness or numbness in arm   [] Weakness or numbness in leg Musculoskeletal:   [] Joint swelling   [x] Joint pain   [] Low back pain Hematologic:  [] Easy bruising  [] Easy bleeding   [] Hypercoagulable state   [] Anemic Gastrointestinal:  [] Diarrhea   [] Vomiting  [x] Gastroesophageal reflux/heartburn   [] Difficulty swallowing. Genitourinary:  [] Chronic kidney disease   [] Difficult urination  [] Frequent urination   [] Blood in urine Skin:  [] Rashes   [] Ulcers  Psychological:  [x] History of anxiety   []  History of major depression.  Physical Examination  There were no vitals filed for this visit. There is no height or weight on file to calculate BMI. Gen: WD/WN, NAD Head: [place]/AT, No temporalis wasting.  Ear/Nose/Throat: Hearing grossly intact, nares w/o erythema or drainage, pinna without lesions Eyes: PER, EOMI, sclera nonicteric.  Neck: Supple, no gross masses.  No JVD.  Pulmonary:  Good air movement, no audible wheezing, no use of accessory muscles.  Cardiac: RRR, precordium not hyperdynamic. Vascular:  scattered varicosities present bilaterally.  Moderate venous stasis changes to the legs  bilaterally.  2+ soft pitting edema. CEAP [id number]   Vessel Right Left  Radial Palpable Palpable  Gastrointestinal: soft, non-distended. No guarding/no peritoneal signs.  Musculoskeletal: M/S 5/5 throughout.  No deformity.  Neurologic: CN 2-12 intact. Pain and light touch intact in extremities.  Symmetrical.  Speech is fluent. Motor exam as listed above. Psychiatric: Judgment intact, Mood & affect appropriate for pt's clinical situation. Dermatologic: Venous rashes no ulcers noted.  No changes consistent with cellulitis. Lymph : No lichenification or skin changes of chronic lymphedema.  CBC Lab Results  Component Value Date   WBC 6.8 01/25/2024   HGB 16.2 01/25/2024   HCT 47.5 01/25/2024   MCV 89 01/25/2024   PLT 153 01/25/2024    BMET    Component Value Date/Time   NA 140 01/25/2024 1021   K 4.6 01/25/2024 1021   CL 102 01/25/2024 1021   CO2 24 01/25/2024 1021   GLUCOSE 91 01/25/2024 1021   BUN 18  01/25/2024 1021   CREATININE 1.01 01/25/2024 1021   CALCIUM  9.3 01/25/2024 1021   GFRNONAA 67 09/02/2020 0905   GFRAA 77 09/02/2020 0905   CrCl cannot be calculated (Patient's most recent lab result is older than the maximum 21 days allowed.).  COAG No results found for: INR, PROTIME  Radiology No results found.   Assessment/Plan 1. Acute deep vein thrombosis (DVT) of popliteal vein of right lower extremity (HCC) (Primary) Recommend:   No surgery or intervention at this point in time.  IVC filter is not indicated at present.  Patient's duplex ultrasound of the venous system shows DVT in the right popliteal and superficial femoral vein.  The patient is reinitiated anticoagulation.  I have given him Eliquis.  He is to take 10 mg twice a day for the first 7 days and then switch to 5 mg twice a day thereafter.  He will be treated for 6 months.  Repeat duplex ultrasound will be obtained in approximately 5 months.  When he returns to the office discussion regarding  whether he will remain on 5 mg twice daily or 2.5 mg twice daily will be finalized.  Elevation was stressed, such as the use of a recliner.  I have reviewed with the patient DVT and post phlebitic changes such as swelling and why it  causes symptoms such as pain.  I recommended to the patient to wear graduated compression stockings, beginning after three full days of anticoagulation.  Graduated compression should be worn on a daily basis. The patient should wear compression beginning first thing in the morning and removing them in the evening. The patient is instructed specifically not to sleep in the stockings.  In addition, behavioral modification including elevation during the day and avoidance of prolonged dependency will be initiated.    The patient will continue anticoagulation for now as there have not been any problems or complications from anticoagulation therapy at this point.  The patient will follow-up with me with noninvasive studies as ordered. - VAS US  LOWER EXTREMITY VENOUS (DVT); Future  2. Lymphedema Recommend:  No surgery or intervention at this point in time.    I have reviewed my discussion with the patient regarding venous insufficiency and secondary lymph edema and why it  causes symptoms. I have discussed with the patient the chronic skin changes that accompany these problems and the long term sequela such as ulceration and infection.  Patient will continue wearing graduated compression on a daily basis a prescription, if needed, was given to the patient to keep this updated. The patient will  put the compression on first thing in the morning and removing them in the evening. The patient is instructed specifically not to sleep in the compression.  In addition, behavioral modification including elevation during the day will be continued.  Diet and salt restriction will also be helpful.  Previous duplex ultrasound of the lower extremities shows normal deep venous system,  superficial reflux was not present.   Following the review of the ultrasound the patient will follow up in 12 months to reassess the degree of swelling and the control that graduated compression is offering.   The patient can be assessed for a Lymph Pump at that time.  However, at this time the patient states they are satisfied with the control compression and elevation is yielding.    3. Primary osteoarthritis of both knees Continue medications to treat the patient's degenerative disease as already ordered, these medications have been reviewed and there are no  changes at this time.  Continued activity and therapy was stressed.  4. Gastroesophageal reflux disease without esophagitis Continue PPI as already ordered, this medication has been reviewed and there are no changes at this time.  Avoidence of caffeine and alcohol  Moderate elevation of the head of the bed   5. Hyperlipidemia, mixed Continue antihypertensive medications as already ordered, these medications have been reviewed and there are no changes at this time.    Cordella Shawl, MD  08/22/2024 11:25 AM

## 2024-08-23 ENCOUNTER — Other Ambulatory Visit: Payer: Self-pay | Admitting: Internal Medicine

## 2024-08-23 NOTE — Progress Notes (Unsigned)
 Date:  08/23/2024   Name:  Arlo Butt.   DOB:  [birth year]   MRN:  [id number]   Chief Complaint: No chief complaint on file.  HPI  Review of Systems   Lab Results  Component Value Date   NA 140 01/25/2024   K 4.6 01/25/2024   CO2 24 01/25/2024   GLUCOSE 91 01/25/2024   BUN 18 01/25/2024   CREATININE 1.01 01/25/2024   CALCIUM  9.3 01/25/2024   EGFR 84 01/25/2024   GFRNONAA 67 09/02/2020   Lab Results  Component Value Date   CHOL 172 01/25/2024   HDL 35 (L) 01/25/2024   LDLCALC 119 (H) 01/25/2024   TRIG 99 01/25/2024   CHOLHDL 4.9 01/25/2024   Lab Results  Component Value Date   TSH 0.690 11/16/2021   No results found for: HGBA1C Lab Results  Component Value Date   WBC 6.8 01/25/2024   HGB 16.2 01/25/2024   HCT 47.5 01/25/2024   MCV 89 01/25/2024   PLT 153 01/25/2024   Lab Results  Component Value Date   ALT 34 01/25/2024   AST 26 01/25/2024   ALKPHOS 86 01/25/2024   BILITOT 1.2 01/25/2024   No results found for: MARIEN BOLLS, VD25OH   Patient Active Problem List   Diagnosis Date Noted   Primary osteoarthritis of both knees 05/27/2024   Aortic atherosclerosis 01/25/2024   Total bilirubin, elevated 01/25/2024   Acquired pes planus of left foot 03/07/2023   Primary osteoarthritis, left ankle and foot 03/07/2023   Osteoarthritis of thumb 12/14/2022   Pallor of extremity 08/30/2022   LBBB (left bundle branch block) 01/11/2022   Hyperlipidemia, mixed 01/11/2022   Lymphedema 08/24/2021   Post-phlebitic syndrome 08/24/2021   Gastroesophageal reflux disease without esophagitis    History of colonic polyps    Current moderate episode of major depressive disorder without prior episode (HCC) 09/02/2020   Varicose veins of right lower extremity with complications 09/01/2020   Generalized anxiety disorder 08/22/2019   SCCA (squamous cell carcinoma) of skin 02/01/2019   DVT (deep venous thrombosis) (HCC) 01/21/2019   Sleep disturbance  12/18/2018   Benign neoplasm of ascending colon    Adaptive colitis 09/19/2013    Allergies  Allergen Reactions   Azithromycin  Hives   Amoxicillin    Clindamycin/Lincomycin Diarrhea   Doxycycline Hives and Diarrhea   Keflex [Cephalexin] Swelling    Past Surgical History:  Procedure Laterality Date   COLONOSCOPY  02/15/11   repeat 10 years   COLONOSCOPY WITH PROPOFOL  N/A 08/15/2016   Procedure: COLONOSCOPY WITH PROPOFOL ;  Surgeon: Rogelia Copping, MD;  Location: [facility];  Service: Endoscopy;  Laterality: N/A;   COLONOSCOPY WITH PROPOFOL  N/A 09/14/2020   Procedure: COLONOSCOPY WITH BIOPSY;  Surgeon: Copping Rogelia, MD;  Location: [facility];  Service: Endoscopy;  Laterality: N/A;   ESOPHAGOGASTRODUODENOSCOPY (EGD) WITH PROPOFOL  N/A 08/15/2016   Procedure: ESOPHAGOGASTRODUODENOSCOPY (EGD) WITH PROPOFOL ;  Surgeon: Rogelia Copping, MD;  Location: [facility];  Service: Endoscopy;  Laterality: N/A;   ESOPHAGOGASTRODUODENOSCOPY (EGD) WITH PROPOFOL  N/A 09/14/2020   Procedure: ESOPHAGOGASTRODUODENOSCOPY (EGD) WITH PROPOFOL ;  Surgeon: Copping Rogelia, MD;  Location: [facility];  Service: Endoscopy;  Laterality: N/A;   POLYPECTOMY  08/15/2016   Procedure: POLYPECTOMY;  Surgeon: Rogelia Copping, MD;  Location: [facility];  Service: Endoscopy;;   POLYPECTOMY N/A 09/14/2020   Procedure: POLYPECTOMY;  Surgeon: Copping Rogelia, MD;  Location: [facility];  Service: Endoscopy;  Laterality: N/A;    Social History  Tobacco Use   Smoking status: Never    Passive exposure: Never   Smokeless tobacco: Never  Vaping Use   Vaping status: Never Used  Substance Use Topics   Alcohol use: Yes    Alcohol/week: 0.0 standard drinks of alcohol    Comment: occasional   Drug use: No     Medication list has been reviewed and updated.  No outpatient medications have been marked as taking for the 08/23/24 encounter (Orders Only) with Justus Leita DEL, MD.        05/27/2024   10:53 AM 01/25/2024    9:36 AM 12/14/2022    1:25 PM 11/14/2022    4:27 PM  GAD 7 : Generalized Anxiety Score  Nervous, Anxious, on Edge 3 2 2 1   Control/stop worrying 3 2 2 1   Worry too much - different things 3 2 2  0  Trouble relaxing 3 1 2  0  Restless 3 0 0 0  Easily annoyed or irritable 3 0 2 0  Afraid - awful might happen 3 1 2  0  Total GAD 7 Score 21 8 12 2   Anxiety Difficulty Extremely difficult Somewhat difficult Somewhat difficult Not difficult at all       07/15/2024    9:11 AM 05/27/2024   10:52 AM 01/25/2024    9:35 AM  Depression screen PHQ 2/9  Decreased Interest 3 3 1   Down, Depressed, Hopeless 3 3 1   PHQ - 2 Score 6 6 2   Altered sleeping 1 1 0  Tired, decreased energy 3 3 1   Change in appetite 0 0 0  Feeling bad or failure about yourself  2 2 0  Trouble concentrating 2 2 0  Moving slowly or fidgety/restless 2 2 0  Suicidal thoughts 0 0 0  PHQ-9 Score 16 16 3   Difficult doing work/chores Very difficult Somewhat difficult Not difficult at all    BP Readings from Last 3 Encounters:  08/22/24 115/76  07/15/24 122/74  05/27/24 110/72    Physical Exam  Wt Readings from Last 3 Encounters:  08/22/24 259 lb 3.2 oz (117.6 kg)  07/15/24 249 lb (112.9 kg)  05/27/24 251 lb (113.9 kg)    There were no vitals taken for this visit.  Assessment and Plan:  Problem List Items Addressed This Visit   None   No follow-ups on file.    Leita HILARIO Justus, MD [facility] [place]

## 2024-08-27 ENCOUNTER — Encounter: Payer: Self-pay | Admitting: Internal Medicine

## 2024-08-27 ENCOUNTER — Ambulatory Visit (INDEPENDENT_AMBULATORY_CARE_PROVIDER_SITE_OTHER): Admitting: Internal Medicine

## 2024-08-27 VITALS — BP 122/68 | HR 88 | Ht 71.0 in | Wt 262.0 lb

## 2024-08-27 DIAGNOSIS — E782 Mixed hyperlipidemia: Secondary | ICD-10-CM | POA: Diagnosis not present

## 2024-08-27 DIAGNOSIS — F411 Generalized anxiety disorder: Secondary | ICD-10-CM | POA: Diagnosis not present

## 2024-08-27 DIAGNOSIS — I700 Atherosclerosis of aorta: Secondary | ICD-10-CM | POA: Diagnosis not present

## 2024-08-27 DIAGNOSIS — R7989 Other specified abnormal findings of blood chemistry: Secondary | ICD-10-CM | POA: Insufficient documentation

## 2024-08-27 DIAGNOSIS — I82431 Acute embolism and thrombosis of right popliteal vein: Secondary | ICD-10-CM

## 2024-08-27 MED ORDER — ALPRAZOLAM 0.5 MG PO TABS: 0.5000 mg | ORAL_TABLET | Freq: Every evening | ORAL | 1 refills | Status: AC | PRN

## 2024-08-27 MED ORDER — ATORVASTATIN CALCIUM 10 MG PO TABS: 10.0000 mg | ORAL_TABLET | Freq: Every day | ORAL | 1 refills | Status: AC

## 2024-08-27 NOTE — Progress Notes (Signed)
 Date:  08/27/2024   Name:  Derrick Galvan.   DOB:  04/05/1961   MRN:  [id number]   Chief Complaint: Anxiety (Anxiety with Depression follow up. Pt would like a Refill for Xanax . He uses this only PRN.), DVT (Wants to discuss getting referral for hematology since he has now had two blood clots in the last 5 years. Seeing vascular surgery currently for DVT. DECLINED Flu shot, and Shingrix  #2 today due to recent DVT.), Hyperlipidemia (Lipids check. Patient started Atorvastatin  several months ago. Not fasting today, but wants lab orders put in to do this fasting.), and HRT (Recheck Free Testosterone  per patient. Last time Free was low.)  Anxiety Presents for follow-up visit. Symptoms include excessive worry, nervous/anxious behavior and palpitations. Patient reports no chest pain, dizziness or shortness of breath. Symptoms occur most days.    Hyperlipidemia This is a chronic problem. Recent lipid tests were reviewed and are high. Pertinent negatives include no chest pain or shortness of breath. Current antihyperlipidemic treatment includes statins (started last visit). There are no compliance problems.   Rash This is a chronic problem. The affected locations include the groin. The rash is characterized by peeling, redness and burning. Pertinent negatives include no cough, diarrhea, fatigue or shortness of breath. Treatments tried: being treated by Dermatology with multiple agents.    Review of Systems  Constitutional:  Negative for fatigue and unexpected weight change.  HENT:  Negative for nosebleeds.   Eyes:  Negative for visual disturbance.  Respiratory:  Negative for cough, chest tightness, shortness of breath and wheezing.   Cardiovascular:  Positive for palpitations. Negative for chest pain and leg swelling.  Gastrointestinal:  Negative for abdominal pain, constipation and diarrhea.  Skin:  Positive for rash.  Neurological:  Negative for dizziness, weakness, light-headedness and  headaches.  Psychiatric/Behavioral:  The patient is nervous/anxious.      Lab Results  Component Value Date   NA 140 01/25/2024   K 4.6 01/25/2024   CO2 24 01/25/2024   GLUCOSE 91 01/25/2024   BUN 18 01/25/2024   CREATININE 1.01 01/25/2024   CALCIUM  9.3 01/25/2024   EGFR 84 01/25/2024   GFRNONAA 67 09/02/2020   Lab Results  Component Value Date   CHOL 172 01/25/2024   HDL 35 (L) 01/25/2024   LDLCALC 119 (H) 01/25/2024   TRIG 99 01/25/2024   CHOLHDL 4.9 01/25/2024   Lab Results  Component Value Date   TSH 0.690 11/16/2021   No results found for: HGBA1C Lab Results  Component Value Date   WBC 6.8 01/25/2024   HGB 16.2 01/25/2024   HCT 47.5 01/25/2024   MCV 89 01/25/2024   PLT 153 01/25/2024   Lab Results  Component Value Date   ALT 34 01/25/2024   AST 26 01/25/2024   ALKPHOS 86 01/25/2024   BILITOT 1.2 01/25/2024   No results found for: MARIEN BOLLS, VD25OH   Patient Active Problem List   Diagnosis Date Noted   Low testosterone  in male 08/27/2024   Primary osteoarthritis of both knees 05/27/2024   Aortic atherosclerosis 01/25/2024   Total bilirubin, elevated 01/25/2024   Acquired pes planus of left foot 03/07/2023   Primary osteoarthritis, left ankle and foot 03/07/2023   Osteoarthritis of thumb 12/14/2022   Pallor of extremity 08/30/2022   LBBB (left bundle branch block) 01/11/2022   Hyperlipidemia, mixed 01/11/2022   Lymphedema 08/24/2021   Post-phlebitic syndrome 08/24/2021   Gastroesophageal reflux disease without esophagitis    History of colonic  polyps    Current moderate episode of major depressive disorder without prior episode (HCC) 09/02/2020   Varicose veins of right lower extremity with complications 09/01/2020   Generalized anxiety disorder 08/22/2019   SCCA (squamous cell carcinoma) of skin 02/01/2019   DVT (deep venous thrombosis) (HCC) 01/21/2019   Sleep disturbance 12/18/2018   Benign neoplasm of ascending colon     Adaptive colitis 09/19/2013    Allergies  Allergen Reactions   Azithromycin  Hives   Amoxicillin    Clindamycin/Lincomycin Diarrhea   Doxycycline Hives and Diarrhea   Keflex [Cephalexin] Swelling    Past Surgical History:  Procedure Laterality Date   COLONOSCOPY  02/15/11   repeat 10 years   COLONOSCOPY WITH PROPOFOL  N/A 08/15/2016   Procedure: COLONOSCOPY WITH PROPOFOL ;  Surgeon: Rogelia Copping, MD;  Location: [facility];  Service: Endoscopy;  Laterality: N/A;   COLONOSCOPY WITH PROPOFOL  N/A 09/14/2020   Procedure: COLONOSCOPY WITH BIOPSY;  Surgeon: Copping Rogelia, MD;  Location: [facility];  Service: Endoscopy;  Laterality: N/A;   ESOPHAGOGASTRODUODENOSCOPY (EGD) WITH PROPOFOL  N/A 08/15/2016   Procedure: ESOPHAGOGASTRODUODENOSCOPY (EGD) WITH PROPOFOL ;  Surgeon: Rogelia Copping, MD;  Location: [facility];  Service: Endoscopy;  Laterality: N/A;   ESOPHAGOGASTRODUODENOSCOPY (EGD) WITH PROPOFOL  N/A 09/14/2020   Procedure: ESOPHAGOGASTRODUODENOSCOPY (EGD) WITH PROPOFOL ;  Surgeon: Copping Rogelia, MD;  Location: [facility];  Service: Endoscopy;  Laterality: N/A;   POLYPECTOMY  08/15/2016   Procedure: POLYPECTOMY;  Surgeon: Rogelia Copping, MD;  Location: [facility];  Service: Endoscopy;;   POLYPECTOMY N/A 09/14/2020   Procedure: POLYPECTOMY;  Surgeon: Copping Rogelia, MD;  Location: [facility];  Service: Endoscopy;  Laterality: N/A;    Social History   Tobacco Use   Smoking status: Never    Passive exposure: Never   Smokeless tobacco: Never  Vaping Use   Vaping status: Never Used  Substance Use Topics   Alcohol use: Yes    Alcohol/week: 0.0 standard drinks of alcohol    Comment: occasional   Drug use: No     Medication list has been reviewed and updated.  Current Meds  Medication Sig   acetaminophen  (TYLENOL ) 500 MG tablet Take 1,000 mg by mouth every 6 (six) hours as needed.   apixaban (ELIQUIS) 5 MG TABS tablet Take 5 mg by mouth 2 (two)  times daily.   clindamycin (CLEOCIN) 300 MG capsule Take 300 mg by mouth 2 (two) times daily.   fluconazole (DIFLUCAN) 200 MG tablet Take 200 mg by mouth daily.   hydrocortisone cream 1 % Apply 1 Application topically 2 (two) times daily.   meloxicam  (MOBIC ) 15 MG tablet Take 1 tablet (15 mg total) by mouth daily.   mometasone (ELOCON) 0.1 % ointment Apply topically.   Multiple Vitamin (MULTI-VITAMIN PO) Take by mouth daily.   Probiotic Product (PROBIOTIC PO) Take by mouth.   valACYclovir  (VALTREX ) 1000 MG tablet Take 1,000 mg by mouth 2 (two) times daily.   [DISCONTINUED] atorvastatin  (LIPITOR) 10 MG tablet Take 1 tablet (10 mg total) by mouth daily.       08/27/2024   10:42 AM 05/27/2024   10:53 AM 01/25/2024    9:36 AM 12/14/2022    1:25 PM  GAD 7 : Generalized Anxiety Score  Nervous, Anxious, on Edge 3 3 2 2   Control/stop worrying 3 3 2 2   Worry too much - different things 3 3 2 2   Trouble relaxing 3 3 1 2   Restless 3 3 0 0  Easily annoyed or  irritable 3 3 0 2  Afraid - awful might happen 3 3 1 2   Total GAD 7 Score 21 21 8 12   Anxiety Difficulty Very difficult Extremely difficult Somewhat difficult Somewhat difficult       08/27/2024   10:41 AM 07/15/2024    9:11 AM 05/27/2024   10:52 AM  Depression screen PHQ 2/9  Decreased Interest 3 3 3   Down, Depressed, Hopeless 3 3 3   PHQ - 2 Score 6 6 6   Altered sleeping 3 1 1   Tired, decreased energy 3 3 3   Change in appetite 0 0 0  Feeling bad or failure about yourself  3 2 2   Trouble concentrating 1 2 2   Moving slowly or fidgety/restless 1 2 2   Suicidal thoughts 0 0 0  PHQ-9 Score 17 16 16   Difficult doing work/chores Somewhat difficult Very difficult Somewhat difficult    BP Readings from Last 3 Encounters:  08/27/24 122/68  08/22/24 115/76  07/15/24 122/74    Physical Exam Vitals and nursing note reviewed.  Constitutional:      General: He is not in acute distress.    Appearance: He is well-developed.  HENT:      Head: Normocephalic and atraumatic.  Cardiovascular:     Rate and Rhythm: Normal rate and regular rhythm.  Pulmonary:     Effort: Pulmonary effort is normal. No respiratory distress.     Breath sounds: No wheezing or rhonchi.  Musculoskeletal:     Right lower leg: Edema present.  Skin:    General: Skin is warm and dry.     Capillary Refill: Capillary refill takes less than 2 seconds.     Findings: No rash.  Neurological:     General: No focal deficit present.     Mental Status: He is alert and oriented to person, place, and time.  Psychiatric:        Mood and Affect: Mood normal.        Behavior: Behavior normal.     Wt Readings from Last 3 Encounters:  08/27/24 262 lb (118.8 kg)  08/22/24 259 lb 3.2 oz (117.6 kg)  07/15/24 249 lb (112.9 kg)    BP 122/68   Pulse 88   Ht 5' 11 (1.803 m)   Wt 262 lb (118.8 kg)   SpO2 98%   BMI 36.54 kg/m   Assessment and Plan:  Problem List Items Addressed This Visit       Unprioritized   DVT (deep venous thrombosis) (HCC)   New diagnosis in the past week - now on the right.  DVT on the left in 2020. May be due to being out of work and inactive since he has the severe rash in the groin area. On Eliquis Seen by VS Recommend anticoagulation for life at this time      Relevant Medications   apixaban (ELIQUIS) 5 MG TABS tablet   atorvastatin  (LIPITOR) 10 MG tablet   Generalized anxiety disorder (Chronic)   Moderate chronic anxiety.  Treated with PRN Xanax .      Relevant Medications   ALPRAZolam  (XANAX ) 0.5 MG tablet   Hyperlipidemia, mixed - Primary (Chronic)   LDL is  Lab Results  Component Value Date   LDLCALC 119 (H) 01/25/2024    Current medication regimen is atorvastatin  started in July. Goal LDL is < 70. Coronary CT ordered again - he never could connect to get it scheduled.       Relevant Medications   apixaban (ELIQUIS) 5 MG  TABS tablet   atorvastatin  (LIPITOR) 10 MG tablet   Other Relevant Orders   Lipid  panel   CT CARDIAC SCORING (SELF PAY ONLY)   Comprehensive metabolic panel with GFR   Aortic atherosclerosis   Now on statin therapy. Will recheck labs today Coronary CT ordered again      Relevant Medications   apixaban (ELIQUIS) 5 MG TABS tablet   atorvastatin  (LIPITOR) 10 MG tablet   Low testosterone  in male   Testosterone  done earlier this year - normal total but low free T. He would interested in supplementation if available but now may be contraindicated with DVT. Will hold off on recheck at this time.       No follow-ups on file.    Leita HILARIO Adie, MD [facility] [place]

## 2024-08-27 NOTE — Assessment & Plan Note (Addendum)
 Testosterone  done earlier this year - normal total but low free T. He would interested in supplementation if available but now may be contraindicated with DVT. Will hold off on recheck at this time.

## 2024-08-27 NOTE — Assessment & Plan Note (Signed)
 Now on statin therapy. Will recheck labs today Coronary CT ordered again

## 2024-08-27 NOTE — Assessment & Plan Note (Signed)
 LDL is  Lab Results  Component Value Date   LDLCALC 119 (H) 01/25/2024    Current medication regimen is atorvastatin  started in July. Goal LDL is < 70. Coronary CT ordered again - he never could connect to get it scheduled.

## 2024-08-27 NOTE — Assessment & Plan Note (Addendum)
 New diagnosis in the past week - now on the right.  DVT on the left in 2020. May be due to being out of work and inactive since he has the severe rash in the groin area. On Eliquis Seen by VS Recommend anticoagulation for life at this time

## 2024-08-27 NOTE — Assessment & Plan Note (Signed)
 Moderate chronic anxiety.  Treated with PRN Xanax .

## 2024-08-28 ENCOUNTER — Telehealth (INDEPENDENT_AMBULATORY_CARE_PROVIDER_SITE_OTHER): Payer: Self-pay

## 2024-08-28 ENCOUNTER — Ambulatory Visit: Payer: Self-pay | Admitting: Internal Medicine

## 2024-08-28 ENCOUNTER — Other Ambulatory Visit: Payer: Self-pay | Admitting: Internal Medicine

## 2024-08-28 LAB — COMPREHENSIVE METABOLIC PANEL WITH GFR
ALT: 22 IU/L (ref 0–44)
AST: 20 IU/L (ref 0–40)
Albumin: 4 g/dL (ref 3.9–4.9)
Alkaline Phosphatase: 109 IU/L (ref 47–123)
BUN/Creatinine Ratio: 18 (ref 10–24)
BUN: 18 mg/dL (ref 8–27)
Bilirubin Total: 1 mg/dL (ref 0.0–1.2)
CO2: 22 mmol/L (ref 20–29)
Calcium: 9.5 mg/dL (ref 8.6–10.2)
Chloride: 104 mmol/L (ref 96–106)
Creatinine, Ser: 1 mg/dL (ref 0.76–1.27)
Globulin, Total: 2.5 g/dL (ref 1.5–4.5)
Glucose: 94 mg/dL (ref 70–99)
Potassium: 4.4 mmol/L (ref 3.5–5.2)
Sodium: 141 mmol/L (ref 134–144)
Total Protein: 6.5 g/dL (ref 6.0–8.5)
eGFR: 85 mL/min/1.73 (ref >59)

## 2024-08-28 LAB — LIPID PANEL
Chol/HDL Ratio: 3.8 ratio (ref 0.0–5.0)
Cholesterol, Total: 167 mg/dL (ref 100–199)
HDL: 44 mg/dL (ref >39)
LDL Chol Calc (NIH): 103 mg/dL — ABNORMAL HIGH (ref 0–99)
Triglycerides: 110 mg/dL (ref 0–149)
VLDL Cholesterol Cal: 20 mg/dL (ref 5–40)

## 2024-08-28 NOTE — Telephone Encounter (Signed)
 Patient called to inquire on status of his Eliquis being sent in to [facility] reports he has one dose left. Per Chart notes appears that his internal medicine office initiated this refill for him on yesterday. Advised him to reach out to them to clarify and let us  know if they are unable to fix this for him. Patient in agreement.

## 2024-08-28 NOTE — Telephone Encounter (Unsigned)
 Copied from CRM [id number]. Topic: Clinical - Prescription Issue >> Aug 28, 2024  3:26 PM Amy B wrote: Reason for CRM: Patient states pharmacy has not received prescription for apixaban (ELIQUIS) 5 MG TABS tablet.  He requests it to be sent to [facility] - [place], [place] - [address]

## 2024-08-29 ENCOUNTER — Other Ambulatory Visit (INDEPENDENT_AMBULATORY_CARE_PROVIDER_SITE_OTHER): Payer: Self-pay

## 2024-08-29 MED ORDER — APIXABAN 5 MG PO TABS: 5.0000 mg | ORAL_TABLET | Freq: Two times a day (BID) | ORAL | 5 refills | Status: AC

## 2024-09-09 ENCOUNTER — Telehealth (INDEPENDENT_AMBULATORY_CARE_PROVIDER_SITE_OTHER): Payer: Self-pay

## 2024-09-09 NOTE — Telephone Encounter (Signed)
 Patient left a message stating that he is having significant amount of swelling with the right leg and foot. Patient is having difficulty standing period at times while on the job. I spoke with Dr Jama he recommended for the patient to come in Thursday for right dvt u/s follow up with provider. Patient has been notified with medical recommendations.

## 2024-09-11 ENCOUNTER — Ambulatory Visit

## 2024-09-12 ENCOUNTER — Encounter (INDEPENDENT_AMBULATORY_CARE_PROVIDER_SITE_OTHER): Payer: Self-pay | Admitting: Vascular Surgery

## 2024-09-12 ENCOUNTER — Ambulatory Visit (INDEPENDENT_AMBULATORY_CARE_PROVIDER_SITE_OTHER)

## 2024-09-12 ENCOUNTER — Ambulatory Visit (INDEPENDENT_AMBULATORY_CARE_PROVIDER_SITE_OTHER): Admitting: Nurse Practitioner

## 2024-09-12 VITALS — BP 131/86 | HR 77 | Resp 18 | Wt 267.0 lb

## 2024-09-12 DIAGNOSIS — I82431 Acute embolism and thrombosis of right popliteal vein: Secondary | ICD-10-CM

## 2024-09-12 DIAGNOSIS — E782 Mixed hyperlipidemia: Secondary | ICD-10-CM | POA: Diagnosis not present

## 2024-09-20 ENCOUNTER — Telehealth (INDEPENDENT_AMBULATORY_CARE_PROVIDER_SITE_OTHER): Payer: Self-pay

## 2024-09-20 NOTE — Telephone Encounter (Signed)
 Left a detailed message on the patient voicemail from note below.

## 2024-09-20 NOTE — Telephone Encounter (Signed)
 Patient left a message requesting if he could continue taking his Meloxicam  to help with his arthritis pain since he is currently on Eliquis. Please Advise

## 2024-09-20 NOTE — Telephone Encounter (Signed)
 Using Meloxicam  is usually not advised with Eliquis due to increased risk of bleeding.

## 2024-09-22 ENCOUNTER — Encounter (INDEPENDENT_AMBULATORY_CARE_PROVIDER_SITE_OTHER): Payer: Self-pay | Admitting: Nurse Practitioner

## 2024-09-22 NOTE — Progress Notes (Signed)
 Subjective:    Patient ID: Derrick Galvan., male    DOB: 06/15/1961, 63 y.o.   MRN: [id number] Chief Complaint  Patient presents with   Follow-up    Dvt u/s follow up    The patient returns today for follow-up of a right lower extremity DVT.  He had a left lower extremity DVT many years ago and he continues to suffer with lymphedema.  The right lower extremity DVT occurred as an unprovoked DVT.  He currently is on Eliquis twice daily.  There has been no development of any ulcerations.  Very diligent with taking his medications.  Noninvasive studies today show thrombus of the right lower extremity but with some slight improvement but no propagation.    Review of Systems  Cardiovascular:  Positive for leg swelling.       Objective:   Physical Exam Vitals reviewed.  HENT:     Head: Normocephalic.  Cardiovascular:     Rate and Rhythm: Normal rate.  Pulmonary:     Effort: Pulmonary effort is normal.  Musculoskeletal:     Right lower leg: Edema present.  Skin:    General: Skin is warm and dry.  Neurological:     Mental Status: He is alert and oriented to person, place, and time.     BP 131/86   Pulse 77   Resp 18   Wt 267 lb (121.1 kg)   BMI 37.24 kg/m   Past Medical History:  Diagnosis Date   Achilles tendinitis 06/16/2020   Acute gastritis without hemorrhage    Adaptive colitis 09/19/2013   Arthritis    knees   Chronic diarrhea 07/31/2013   DVT (deep venous thrombosis) (HCC) 12/21/2018   LLL   GERD (gastroesophageal reflux disease)    Hepatitis B 1989   resolved   Skin cancer    on face - removed under local    Social History   Socioeconomic History   Marital status: Single    Spouse name: Not on file   Number of children: 0   Years of education: Not on file   Highest education level: Not on file  Occupational History   Occupation: Production Designer, Theatre/television/film  Tobacco Use   Smoking status: Never    Passive exposure: Never   Smokeless tobacco: Never  Vaping Use    Vaping status: Never Used  Substance and Sexual Activity   Alcohol use: Yes    Alcohol/week: 0.0 standard drinks of alcohol    Comment: occasional   Drug use: No   Sexual activity: Yes  Other Topics Concern   Not on file  Social History Narrative   Not on file   Social Drivers of Health   Financial Resource Strain: Not on file  Food Insecurity: Not on file  Transportation Needs: Not on file  Physical Activity: Not on file  Stress: Not on file  Social Connections: Not on file  Intimate Partner Violence: Not on file    Past Surgical History:  Procedure Laterality Date   COLONOSCOPY  02/15/11   repeat 10 years   COLONOSCOPY WITH PROPOFOL  N/A 08/15/2016   Procedure: COLONOSCOPY WITH PROPOFOL ;  Surgeon: Rogelia Copping, MD;  Location: [facility];  Service: Endoscopy;  Laterality: N/A;   COLONOSCOPY WITH PROPOFOL  N/A 09/14/2020   Procedure: COLONOSCOPY WITH BIOPSY;  Surgeon: Copping Rogelia, MD;  Location: [facility];  Service: Endoscopy;  Laterality: N/A;   ESOPHAGOGASTRODUODENOSCOPY (EGD) WITH PROPOFOL  N/A 08/15/2016   Procedure: ESOPHAGOGASTRODUODENOSCOPY (EGD) WITH PROPOFOL ;  Surgeon: Rogelia Copping, MD;  Location[facility];  Service: Endoscopy;  Laterality: N/A;   ESOPHAGOGASTRODUODENOSCOPY (EGD) WITH PROPOFOL  N/A 09/14/2020   Procedure: ESOPHAGOGASTRODUODENOSCOPY (EGD) WITH PROPOFOL ;  Surgeon: Copping Rogelia, MD;  Location: [facility];  Service: Endoscopy;  Laterality: N/A;   POLYPECTOMY  08/15/2016   Procedure: POLYPECTOMY;  Surgeon: Rogelia Copping, MD;  Location: [facility];  Service: Endoscopy;;   POLYPECTOMY N/A 09/14/2020   Procedure: POLYPECTOMY;  Surgeon: Copping Rogelia, MD;  Location: [facility];  Service: Endoscopy;  Laterality: N/A;    Family History  Problem Relation Age of Onset   Healthy Mother    COPD Father     Allergies  Allergen Reactions   Azithromycin  Hives   Amoxicillin    Clindamycin/Lincomycin Diarrhea    Doxycycline Hives and Diarrhea   Keflex [Cephalexin] Swelling       Latest Ref Rng & Units 01/25/2024   10:21 AM 11/16/2021   11:04 AM 09/02/2020    9:05 AM  CBC  WBC 3.4 - 10.8 x10E3/uL 6.8  7.6  7.8   Hemoglobin 13.0 - 17.7 g/dL 83.7  83.0  82.7   Hematocrit 37.5 - 51.0 % 47.5  49.2  50.2   Platelets 150 - 450 x10E3/uL 153  147  173       CMP     Component Value Date/Time   NA 141 08/27/2024 1135   K 4.4 08/27/2024 1135   CL 104 08/27/2024 1135   CO2 22 08/27/2024 1135   GLUCOSE 94 08/27/2024 1135   BUN 18 08/27/2024 1135   CREATININE 1.00 08/27/2024 1135   CALCIUM  9.5 08/27/2024 1135   PROT 6.5 08/27/2024 1135   ALBUMIN 4.0 08/27/2024 1135   AST 20 08/27/2024 1135   ALT 22 08/27/2024 1135   ALKPHOS 109 08/27/2024 1135   BILITOT 1.0 08/27/2024 1135   EGFR 85 08/27/2024 1135   GFRNONAA 67 09/02/2020 0905     No results found.     Assessment & Plan:   1. Acute deep vein thrombosis (DVT) of popliteal vein of right lower extremity (HCC) (Primary) Studies today indicate there is some improvement in the DVT in the right lower extremity from what was initially diagnosed about 2 weeks ago.  He is advised to continue with use of medical grade compression stockings and elevation.   This is his second unprovoked DVT and based on laboratory recommend patient remain on anticoagulation.  However given this we will refer him to hematology for further workup.  Will plan on having him return in 6 months or sooner if issues arise.  2. Hyperlipidemia, mixed Continue statin as ordered and reviewed, no changes at this time   Current Outpatient Medications on File Prior to Visit  Medication Sig Dispense Refill   acetaminophen  (TYLENOL ) 500 MG tablet Take 1,000 mg by mouth every 6 (six) hours as needed.     ALPRAZolam  (XANAX ) 0.5 MG tablet Take 1 tablet (0.5 mg total) by mouth at bedtime as needed for anxiety. 30 tablet 1   apixaban (ELIQUIS) 5 MG TABS tablet Take 1 tablet (5 mg  total) by mouth 2 (two) times daily. 60 tablet 5   atorvastatin  (LIPITOR) 10 MG tablet Take 1 tablet (10 mg total) by mouth daily. 90 tablet 1   clindamycin (CLEOCIN) 300 MG capsule Take 300 mg by mouth 2 (two) times daily.     fluconazole (DIFLUCAN) 200 MG tablet Take 200 mg by mouth daily.     hydrocortisone cream  1 % Apply 1 Application topically 2 (two) times daily.     meloxicam  (MOBIC ) 15 MG tablet Take 1 tablet (15 mg total) by mouth daily. 30 tablet 5   mometasone (ELOCON) 0.1 % ointment Apply topically.     Multiple Vitamin (MULTI-VITAMIN PO) Take by mouth daily.     Probiotic Product (PROBIOTIC PO) Take by mouth.     valACYclovir  (VALTREX ) 1000 MG tablet Take 1,000 mg by mouth 2 (two) times daily.     No current facility-administered medications on file prior to visit.    There are no Patient Instructions on file for this visit. No follow-ups on file.   Abuk Selleck E Samel Bruna, NP

## 2024-09-25 ENCOUNTER — Ambulatory Visit

## 2024-10-01 ENCOUNTER — Ambulatory Visit

## 2024-10-09 ENCOUNTER — Ambulatory Visit
Admission: RE | Admit: 2024-10-09 | Discharge: 2024-10-09 | Disposition: A | Payer: Self-pay | Source: Ambulatory Visit | Attending: Internal Medicine | Admitting: Internal Medicine

## 2024-10-09 DIAGNOSIS — E782 Mixed hyperlipidemia: Secondary | ICD-10-CM | POA: Insufficient documentation

## 2024-10-16 ENCOUNTER — Inpatient Hospital Stay: Attending: Oncology | Admitting: Oncology

## 2024-10-16 ENCOUNTER — Encounter: Payer: Self-pay | Admitting: Oncology

## 2024-10-16 ENCOUNTER — Inpatient Hospital Stay

## 2024-10-16 VITALS — BP 132/83 | HR 80 | Temp 96.0°F | Resp 18 | Wt 270.9 lb

## 2024-10-16 DIAGNOSIS — Z807 Family history of other malignant neoplasms of lymphoid, hematopoietic and related tissues: Secondary | ICD-10-CM | POA: Insufficient documentation

## 2024-10-16 DIAGNOSIS — Z7901 Long term (current) use of anticoagulants: Secondary | ICD-10-CM | POA: Diagnosis not present

## 2024-10-16 DIAGNOSIS — I82411 Acute embolism and thrombosis of right femoral vein: Secondary | ICD-10-CM | POA: Diagnosis present

## 2024-10-16 DIAGNOSIS — Z801 Family history of malignant neoplasm of trachea, bronchus and lung: Secondary | ICD-10-CM | POA: Insufficient documentation

## 2024-10-16 DIAGNOSIS — I87009 Postthrombotic syndrome without complications of unspecified extremity: Secondary | ICD-10-CM | POA: Insufficient documentation

## 2024-10-16 DIAGNOSIS — I82431 Acute embolism and thrombosis of right popliteal vein: Secondary | ICD-10-CM | POA: Insufficient documentation

## 2024-10-16 DIAGNOSIS — Z139 Encounter for screening, unspecified: Secondary | ICD-10-CM

## 2024-10-16 LAB — CBC (CANCER CENTER ONLY)
HCT: 47.3 % (ref 39.0–52.0)
Hemoglobin: 16.3 g/dL (ref 13.0–17.0)
MCH: 30.5 pg (ref 26.0–34.0)
MCHC: 34.5 g/dL (ref 30.0–36.0)
MCV: 88.6 fL (ref 80.0–100.0)
Platelet Count: 236 K/uL (ref 150–400)
RBC: 5.34 MIL/uL (ref 4.22–5.81)
RDW: 12.3 % (ref 11.5–15.5)
WBC Count: 7.7 K/uL (ref 4.0–10.5)
nRBC: 0 % (ref 0.0–0.2)

## 2024-10-16 LAB — ANTITHROMBIN III: AntiThromb III Func: 106 % (ref 75–120)

## 2024-10-16 NOTE — Progress Notes (Signed)
 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 Acute right lower extremity DVT, I agree with anticoagulation with Eliquis  5 mg twice daily. Given that he has had recurrent unprovoked DVT, recommend patient to have anticoagulation definitively.  After he finishes 3 to 6 months of therapeutic anticoagulation, plan to decrease to 2.5 mg twice daily as prophylaxis  Check hypercoagulable workup. Recommend age-appropriate cancer screening  Post-phlebitic syndrome Recommend leg elevation and utilization of compression stocking.  Follow-up with vascular surgery   Orders Placed This Encounter  Procedures   Factor 5 leiden    Standing Status:   Future    Number of Occurrences:   1    Expected Date:   10/16/2024    Expiration Date:   01/14/2025   PNH Profile (-High Sensitivity)    Standing Status:   Future    Number of Occurrences:   1    Expected Date:   10/16/2024    Expiration Date:   01/14/2025   Protein C activity    Standing Status:   Future    Number of Occurrences:   1    Expected Date:   10/16/2024    Expiration Date:   01/14/2025   Protein S, total and free    Standing Status:   Future    Number of Occurrences:   1    Expected Date:   10/16/2024    Expiration Date:   01/14/2025   Prothrombin gene mutation    Standing Status:   Future    Number of Occurrences:   1    Expected Date:   10/16/2024    Expiration Date:   01/14/2025   Antithrombin III    Standing Status:   Future    Number of Occurrences:   1    Expected Date:   10/16/2024    Expiration Date:   01/14/2025   ANTIPHOSPHOLIPID SYNDROME PROF    Standing Status:   Future    Number of Occurrences:   1     Expected Date:   10/16/2024    Expiration Date:   01/14/2025   Beta-2-glycoprotein i abs, IgG/M/A    Standing Status:   Future    Number of Occurrences:   1    Expected Date:   10/16/2024    Expiration Date:   01/14/2025   Protein C, total    Standing Status:   Future    Number of Occurrences:   1    Expected Date:   10/16/2024    Expiration Date:   01/14/2025   CBC (Cancer Center Only)    Standing Status:   Future    Number of Occurrences:   1    Expected Date:   10/16/2024    Expiration Date:   01/14/2025   Ambulatory referral to Social Work    Referral Priority:   Routine    Referral Type:   Consultation    Referral Reason:   Specialty Services Required    Number of Visits Requested:   1    All questions were answered. The patient knows to call the clinic with any problems, questions or concerns.  Zelphia Cap, MD, PhD [facility] Hematology Oncology 10/16/2024  HISTORY OF PRESENTING ILLNESS:   Derrick Galvan. is a  63 y.o.  male with PMH listed below was seen in consultation at the request of  Justus Leita DEL, MD  for evaluation of recurrent lower extremity DVT.  Discussed the use of AI scribe software for clinical note transcription with the patient, who gave verbal consent to proceed.   Patient has a history of left lower extremity DVT.  This was diagnosed in 2020 and was felt to be unprovoked event.  He was treated with Xarelto  for six months, resulting in clot reduction.  Patient has had chronic left lower extremity swelling.  October 2025 he noted swelling and pain in the right leg, similar to his prior left leg DVT in 2020. Symptoms began approximately 24 hours prior to seeking care.  08/26/2024, right lower extremity ultrasound showed acute DVT involving right femoral vein, right popliteal vein.  Left lower extremity ultrasound showed no evidence of common femoral vein obstruction.  Patient was started on Eliquis  for anticoagulation.  He tolerates well.  09/12/2024,  patient follow-up with vascular surgery and had a repeat ultrasound which showed some improvement.  Acute right popliteal DVT.  Age-indeterminate DVT involving right femoral vein.  Patient denies any immobilization events prior to this event.  He continues to experience significant right leg edema, with pain that is less prominent and partially masked by underlying knee osteoarthritis.  He has had persistent edema and skin discoloration in the left leg since the 2020 DVT. No history of DVTs in other locations, pulmonary embolism, chest pain, or dyspnea.   He describes a recent acute staphylococcal infection in the groin prior to the right leg DVT, resulting in slightly reduced activity but not complete immobilization. No other provoking factors such as recent surgery or prolonged travel identified. Family history includes a sister with provoked DVT after knee replacement, a maternal grandmother with metastatic lymphoma, and a maternal aunt with lung cancer and tobacco use. No known family history of unprovoked DVT or inherited thrombophilia.  He denies tobacco use, has a good appetite, and no unintentional weight loss.   MEDICAL HISTORY:  Past Medical History:  Diagnosis Date   Achilles tendinitis 06/16/2020   Acute gastritis without hemorrhage    Adaptive colitis 09/19/2013   Arthritis    knees   Chronic diarrhea 07/31/2013   DVT (deep venous thrombosis) (HCC) 12/21/2018   LLL   GERD (gastroesophageal reflux disease)    Hepatitis B 1989   resolved   Skin cancer    on face - removed under local    SURGICAL HISTORY: Past Surgical History:  Procedure Laterality Date   COLONOSCOPY  02/15/11   repeat 10 years   COLONOSCOPY WITH PROPOFOL  N/A 08/15/2016   Procedure: COLONOSCOPY WITH PROPOFOL ;  Surgeon: Rogelia Copping, MD;  Location: [facility];  Service: Endoscopy;  Laterality: N/A;   COLONOSCOPY WITH PROPOFOL  N/A 09/14/2020   Procedure: COLONOSCOPY WITH BIOPSY;  Surgeon: Copping Rogelia, MD;  Location: [facility];  Service: Endoscopy;  Laterality: N/A;   ESOPHAGOGASTRODUODENOSCOPY (EGD) WITH PROPOFOL  N/A 08/15/2016   Procedure: ESOPHAGOGASTRODUODENOSCOPY (EGD) WITH PROPOFOL ;  Surgeon: Rogelia Copping, MD;  Location: [facility];  Service: Endoscopy;  Laterality: N/A;   ESOPHAGOGASTRODUODENOSCOPY (EGD) WITH PROPOFOL  N/A 09/14/2020   Procedure: ESOPHAGOGASTRODUODENOSCOPY (EGD) WITH PROPOFOL ;  Surgeon: Copping Rogelia, MD;  Location: [facility];  Service: Endoscopy;  Laterality: N/A;   POLYPECTOMY  08/15/2016   Procedure: POLYPECTOMY;  Surgeon: Rogelia Copping, MD;  Location: [facility];  Service: Endoscopy;;   POLYPECTOMY N/A 09/14/2020   Procedure: POLYPECTOMY;  Surgeon: Jinny Carmine, MD;  Location: [facility];  Service: Endoscopy;  Laterality: N/A;    SOCIAL HISTORY: Social History   Socioeconomic History   Marital status: Single    Spouse name: Not on file   Number of children: 0   Years of education: Not on file   Highest education level: Not on file  Occupational History   Occupation: Production Designer, Theatre/television/film  Tobacco Use   Smoking status: Never    Passive exposure: Never   Smokeless tobacco: Never  Vaping Use   Vaping status: Never Used  Substance and Sexual Activity   Alcohol use: Yes    Alcohol/week: 0.0 standard drinks of alcohol    Comment: occasional   Drug use: No   Sexual activity: Yes  Other Topics Concern   Not on file  Social History Narrative   Not on file   Social Drivers of Health   Financial Resource Strain: Low Risk  (10/16/2024)   Overall Financial Resource Strain (CARDIA)    Difficulty of Paying Living Expenses: Not very hard  Food Insecurity: No Food Insecurity (10/16/2024)   Hunger Vital Sign    Worried About Running Out of Food in the Last Year: Never true    Ran Out of Food in the Last Year: Never true  Transportation Needs: No Transportation Needs (10/16/2024)   PRAPARE - Scientist, Research (physical Sciences) (Medical): No    Lack of Transportation (Non-Medical): No  Physical Activity: Not on file  Stress: No Stress Concern Present (10/16/2024)   [employer] of Occupational Health - Occupational Stress Questionnaire    Feeling of Stress: Only a little  Social Connections: Not on file  Intimate Partner Violence: Not on file    FAMILY HISTORY: Family History  Problem Relation Age of Onset   Healthy Mother    COPD Father    Lymphoma Maternal Grandmother    Lung cancer Maternal Aunt     ALLERGIES:  is allergic to azithromycin , amoxicillin, clindamycin/lincomycin, doxycycline, and keflex [cephalexin].  MEDICATIONS:  Current Outpatient Medications  Medication Sig Dispense Refill   acetaminophen  (TYLENOL ) 500 MG tablet Take 1,000 mg by mouth every 6 (six) hours as needed.     ALPRAZolam  (XANAX ) 0.5 MG tablet Take 1 tablet (0.5 mg total) by mouth at bedtime as needed for anxiety. 30 tablet 1   apixaban  (ELIQUIS ) 5 MG TABS tablet Take 1 tablet (5 mg total) by mouth 2 (two) times daily. 60 tablet 5   atorvastatin  (LIPITOR) 10 MG tablet Take 1 tablet (10 mg total) by mouth daily. 90 tablet 1   clindamycin (CLEOCIN) 300 MG capsule Take 300 mg by mouth 2 (two) times daily.     fluconazole (DIFLUCAN) 200 MG tablet Take 200 mg by mouth daily.     hydrocortisone cream 1 % Apply 1 Application topically 2 (two) times daily.     meloxicam  (MOBIC ) 15 MG tablet Take 1 tablet (15 mg total) by mouth daily. 30 tablet 5   mometasone (ELOCON) 0.1 % ointment Apply topically.     Multiple Vitamin (MULTI-VITAMIN PO) Take by mouth daily.     Probiotic Product (PROBIOTIC PO) Take by mouth.     valACYclovir  (VALTREX ) 1000 MG tablet Take 1,000 mg by mouth 2 (two) times daily.     No current facility-administered medications for this visit.    Review of Systems  Constitutional:  Negative for appetite  change, chills, fatigue and fever.  HENT:   Negative for hearing loss and voice change.    Eyes:  Negative for eye problems.  Respiratory:  Negative for chest tightness and cough.   Cardiovascular:  Positive for leg swelling. Negative for chest pain.  Gastrointestinal:  Negative for abdominal distention, abdominal pain and blood in stool.  Endocrine: Negative for hot flashes.  Genitourinary:  Negative for difficulty urinating and frequency.   Musculoskeletal:  Negative for arthralgias.  Skin:  Negative for itching and rash.  Neurological:  Negative for extremity weakness.  Hematological:  Negative for adenopathy.  Psychiatric/Behavioral:  Negative for confusion.    PHYSICAL EXAMINATION:  Vitals:   10/16/24 1132  BP: 132/83  Pulse: 80  Resp: 18  Temp: (!) 96 F (35.6 C)  SpO2: 96%   Filed Weights   10/16/24 1132  Weight: 270 lb 14.4 oz (122.9 kg)    Physical Exam Constitutional:      General: He is not in acute distress. HENT:     Head: Normocephalic and atraumatic.  Eyes:     General: No scleral icterus. Cardiovascular:     Rate and Rhythm: Normal rate and regular rhythm.  Pulmonary:     Effort: Pulmonary effort is normal. No respiratory distress.     Breath sounds: Normal breath sounds. No wheezing.  Abdominal:     General: Bowel sounds are normal. There is no distension.     Palpations: Abdomen is soft.  Musculoskeletal:        General: No deformity. Normal range of motion.     Cervical back: Normal range of motion and neck supple.     Right lower leg: Edema present.     Left lower leg: Edema present.  Skin:    General: Skin is warm and dry.     Findings: No erythema or rash.  Neurological:     Mental Status: He is alert and oriented to person, place, and time. Mental status is at baseline.  Psychiatric:        Mood and Affect: Mood normal.     LABORATORY DATA:  I have reviewed the data as listed    Latest Ref Rng & Units 10/16/2024   12:20 PM 01/25/2024   10:21 AM 11/16/2021   11:04 AM  CBC  WBC 4.0 - 10.5 K/uL 7.7  6.8  7.6    Hemoglobin 13.0 - 17.0 g/dL 83.6  83.7  83.0   Hematocrit 39.0 - 52.0 % 47.3  47.5  49.2   Platelets 150 - 400 K/uL 236  153  147       Latest Ref Rng & Units 08/27/2024   11:35 AM 01/25/2024   10:21 AM 08/28/2023    8:42 AM  CMP  Glucose 70 - 99 mg/dL 94  91  80   BUN 8 - 27 mg/dL 18  18  20    Creatinine 0.76 - 1.27 mg/dL 8.99  8.98  8.99   Sodium 134 - 144 mmol/L 141  140  141   Potassium 3.5 - 5.2 mmol/L 4.4  4.6  4.4   Chloride 96 - 106 mmol/L 104  102  106   CO2 20 - 29 mmol/L 22  24  24    Calcium  8.6 - 10.2 mg/dL 9.5  9.3  9.0   Total Protein 6.0 - 8.5 g/dL 6.5  6.3    Total Bilirubin 0.0 - 1.2 mg/dL 1.0  1.2    Alkaline Phos 47 - 123 IU/L 109  86    AST 0 - 40 IU/L 20  26    ALT 0 - 44 IU/L 22  34        RADIOGRAPHIC STUDIES: I have personally reviewed the radiological images as listed and agreed with the findings in the report. CT CARDIAC SCORING (SELF PAY ONLY) Result Date: 10/09/2024 CLINICAL DATA:  Risk stratification EXAM: Coronary Calcium  Score TECHNIQUE: The patient was scanned on a Siemens Somatom scanner. Axial non-contrast 3 mm slices were carried out through the heart. The data set was analyzed on a dedicated work station and scored using the Agatson method. FINDINGS: Non-cardiac: See separate report from [facility] Radiology. Ascending Aorta: Normal size Pericardium: Normal Coronary arteries: Normal origin of left and right coronary arteries. Distribution of arterial calcifications if present, as noted below; LM 0 LAD 0 LCx 0 RCA 0 Total 0 IMPRESSION AND RECOMMENDATION: 1. Coronary calcium  score of 0. 2. CAC 0, CAC-DRS A0. 3. Continue heart healthy lifestyle and risk factor modification. Electronically Signed   By: Redell Cave M.D.   On: 10/09/2024 15:42   VAS US  LOWER EXTREMITY VENOUS (DVT) Result Date: 09/13/2024  Lower Venous DVT Study Patient Name:  Derrick Galvan.  Date of Exam:   09/12/2024 Medical Rec #: [id number]        Accession #:    [id number]  Date of Birth: May 15, 1961       Patient Gender: M Patient Age:   63 years Exam Location:  [facility] Procedure:      VAS US  LOWER EXTREMITY VENOUS (DVT) Referring Phys: [facility] --------------------------------------------------------------------------------  Indications: Pain, Swelling, and Edema.  Risk Factors: DVT Known DVT from distal femorla vein through popliteal vein. Anticoagulation: Eliquis . Performing Technologist: Donnice Charnley RVT  Examination Guidelines: A complete evaluation includes B-mode imaging, spectral Doppler, color Doppler, and power Doppler as needed of all accessible portions of each vessel. Bilateral testing is considered an integral part of a complete examination. Limited examinations for reoccurring indications may be performed as noted. The reflux portion of the exam is performed with the patient in reverse Trendelenburg.  +---------+---------------+---------+-----------+---------------+--------------+ RIGHT    CompressibilityPhasicitySpontaneityProperties     Thrombus Aging +---------+---------------+---------+-----------+---------------+--------------+ CFV      Full           Yes      Yes                                      +---------+---------------+---------+-----------+---------------+--------------+ SFJ      Full           Yes      Yes                                      +---------+---------------+---------+-----------+---------------+--------------+ FV Prox  Full           Yes      Yes                                      +---------+---------------+---------+-----------+---------------+--------------+ FV Mid   Full           Yes      Yes                                      +---------+---------------+---------+-----------+---------------+--------------+  FV DistalPartial        No       Yes        partially                                                                 re-cannalized                  +---------+---------------+---------+-----------+---------------+--------------+ PFV      Full                                                             +---------+---------------+---------+-----------+---------------+--------------+ POP      None           No       No         softly                                                                    echogenic                     +---------+---------------+---------+-----------+---------------+--------------+ PTV      Full                                                             +---------+---------------+---------+-----------+---------------+--------------+ GSV      Full                                                             +---------+---------------+---------+-----------+---------------+--------------+ SSV      Full                                                             +---------+---------------+---------+-----------+---------------+--------------+   +----+---------------+---------+-----------+----------+--------------+ LEFTCompressibilityPhasicitySpontaneityPropertiesThrombus Aging +----+---------------+---------+-----------+----------+--------------+ CFV Full           Yes      Yes                                 +----+---------------+---------+-----------+----------+--------------+   Summary: RIGHT: - Findings consistent with acute deep vein thrombosis involving the right popliteal vein.  - Findings consistent with age indeterminate deep vein thrombosis involving the right femoral vein.  - There is no evidence of  superficial venous thrombosis.  - No cystic structure found in the popliteal fossa. - Thrombus extends from mid to distal femoral vein where it is partially compressible and recanulated through to the popliteal vein where it is dilated and non compressible.  LEFT: - No evidence of common femoral vein obstruction.   *See table(s) above for measurements and observations.  Electronically signed by Cordella Shawl MD on 09/13/2024 at 8:49:49 AM.    Final    VAS US  LOWER EXTREMITY VENOUS (DVT) Result Date: 08/26/2024  Lower Venous DVT Study Patient Name:  Dejan Angert.  Date of Exam:   08/22/2024 Medical Rec #: [id number]        Accession #:    [id number] Date of Birth: 10-Feb-1961       Patient Gender: M Patient Age:   14 years Exam Location:  [facility] Procedure:      VAS US  LOWER EXTREMITY VENOUS (DVT) Referring Phys: SELINDA GU --------------------------------------------------------------------------------  Performing Technologist: Leafy Gibes RVS  Examination Guidelines: A complete evaluation includes B-mode imaging, spectral Doppler, color Doppler, and power Doppler as needed of all accessible portions of each vessel. Bilateral testing is considered an integral part of a complete examination. Limited examinations for reoccurring indications may be performed as noted. The reflux portion of the exam is performed with the patient in reverse Trendelenburg.  +---------+---------------+---------+-----------+----------+--------------+ RIGHT    CompressibilityPhasicitySpontaneityPropertiesThrombus Aging +---------+---------------+---------+-----------+----------+--------------+ CFV      Full           Yes      Yes                                 +---------+---------------+---------+-----------+----------+--------------+ SFJ      Full           Yes      Yes                                 +---------+---------------+---------+-----------+----------+--------------+ FV Prox  Full           Yes      Yes                                 +---------+---------------+---------+-----------+----------+--------------+ FV Mid   Full           Yes      Yes                                 +---------+---------------+---------+-----------+----------+--------------+ FV DistalNone           No       No                                   +---------+---------------+---------+-----------+----------+--------------+ PFV      Full           Yes      Yes                                 +---------+---------------+---------+-----------+----------+--------------+ POP      None           No  No                                  +---------+---------------+---------+-----------+----------+--------------+ PTV      Full           Yes      Yes                                 +---------+---------------+---------+-----------+----------+--------------+ PERO     Full           Yes      Yes                                 +---------+---------------+---------+-----------+----------+--------------+ GSV      Full           Yes      Yes                                 +---------+---------------+---------+-----------+----------+--------------+ SSV      Full           Yes      Yes                                 +---------+---------------+---------+-----------+----------+--------------+   +----+---------------+---------+-----------+----------+--------------+ LEFTCompressibilityPhasicitySpontaneityPropertiesThrombus Aging +----+---------------+---------+-----------+----------+--------------+ CFV Full           Yes      Yes                                 +----+---------------+---------+-----------+----------+--------------+     Summary: RIGHT: - Findings consistent with acute deep vein thrombosis involving the right femoral vein, and right popliteal vein.  - The Right SFV Distal segment, Popliteal Vein and Proximal ATV appears to display signs of An Acute Process of DVT.  LEFT: - No evidence of common femoral vein obstruction.   *See table(s) above for measurements and observations. Electronically signed by Cordella Shawl MD on 08/26/2024 at 11:16:01 AM.    Final

## 2024-10-16 NOTE — Assessment & Plan Note (Signed)
 Recommend leg elevation and utilization of compression stocking.  Follow-up with vascular surgery

## 2024-10-16 NOTE — Assessment & Plan Note (Addendum)
 History of unprovoked left popliteal vein DVT, treated with 6 months of Xarelto . Acute right lower extremity DVT, I agree with anticoagulation with Eliquis  5 mg twice daily. Given that he has had recurrent unprovoked DVT, recommend patient to have anticoagulation definitively.  After he finishes 3 to 6 months of therapeutic anticoagulation, plan to decrease to 2.5 mg twice daily as prophylaxis  Check hypercoagulable workup. Recommend age-appropriate cancer screening

## 2024-10-17 LAB — BETA-2-GLYCOPROTEIN I ABS, IGG/M/A
Beta-2 Glyco I IgG: <9 GPI IgG units (ref 0–20)
Beta-2-Glycoprotein I IgA: <9 GPI IgA units (ref 0–25)
Beta-2-Glycoprotein I IgM: <9 GPI IgM units (ref 0–32)

## 2024-10-18 LAB — PROTEIN S, TOTAL AND FREE
Protein S Ag, Free: 102 % (ref 61–136)
Protein S Ag, Total: 93 % (ref 60–150)

## 2024-10-18 LAB — PROTEIN C ACTIVITY: Protein C Activity: 100 % (ref 73–180)

## 2024-10-18 LAB — DRVVT CONFIRM: dRVVT Confirm: 1.2 ratio (ref 0.8–1.2)

## 2024-10-18 LAB — DRVVT MIX: dRVVT Mix: 46.8 s — ABNORMAL HIGH (ref 0.0–40.4)

## 2024-10-19 LAB — PROTEIN C, TOTAL: Protein C, Total: 57 % — ABNORMAL LOW (ref 60–150)

## 2024-10-19 LAB — PNH PROFILE (-HIGH SENSITIVITY)

## 2024-10-21 ENCOUNTER — Inpatient Hospital Stay

## 2024-10-21 LAB — PROTHROMBIN GENE MUTATION

## 2024-10-21 LAB — FACTOR 5 LEIDEN

## 2024-10-21 NOTE — Progress Notes (Signed)
 [facility] Clinical Social Work  Clinical Social Work was referred by medical provider for SDOH needs.  Clinical Social Worker contacted patient by phone to offer support and assess for needs.  Patient stated his SDOH issues are currently being resolved.   Interventions: Provided patient with information about CSW role.  Patient expressed no needs at this time.       Follow Up Plan:  Patient will contact CSW with any support or resource needs    Macario CHRISTELLA Au, LCSW  Clinical Social Worker [facility]

## 2024-10-21 NOTE — Telephone Encounter (Signed)
 Please review.  KP

## 2024-10-23 LAB — ANTIPHOSPHOLIPID SYNDROME PROF
Anticardiolipin IgG: <9 GPL U/mL (ref 0–14)
Anticardiolipin IgM: <9 [MPL'U]/mL (ref 0–12)
DRVVT: 66.5 s — ABNORMAL HIGH (ref 0.0–47.0)
PTT Lupus Anticoagulant: 38.6 s (ref 0.0–43.5)

## 2024-10-24 ENCOUNTER — Ambulatory Visit: Payer: Self-pay | Admitting: Oncology

## 2024-10-24 ENCOUNTER — Other Ambulatory Visit: Payer: Self-pay | Admitting: Oncology

## 2024-10-24 DIAGNOSIS — I82431 Acute embolism and thrombosis of right popliteal vein: Secondary | ICD-10-CM

## 2024-10-24 NOTE — Telephone Encounter (Signed)
 Call and spoke to pt and informed him about lab work results. Informed him that there is no change in management plan and MD recommendation and that labs will be further discussed at upcoming appts.   Please r/s lab on 3/11 to 3/4 @ 11:30a. Pt aware of lab date/ time change.   Keep MD on 3/11 as scheduled.

## 2024-10-24 NOTE — Telephone Encounter (Signed)
-----   Message from Zelphia Cap, MD sent at 10/24/2024  8:54 AM EST ----- Please let patient know that his lab work results are overall good with a few results that need further discussion. These will not change management plan and my recommendation and I will discuss in  details with him at next visit.  I will repeat some tests before his next visit. Please change his lab to be 1 week prior to next visit.

## 2024-12-24 ENCOUNTER — Ambulatory Visit: Admitting: Gastroenterology

## 2025-01-08 ENCOUNTER — Inpatient Hospital Stay

## 2025-01-15 ENCOUNTER — Inpatient Hospital Stay: Admitting: Oncology

## 2025-01-15 ENCOUNTER — Inpatient Hospital Stay

## 2025-01-20 ENCOUNTER — Encounter (INDEPENDENT_AMBULATORY_CARE_PROVIDER_SITE_OTHER)

## 2025-01-20 ENCOUNTER — Ambulatory Visit (INDEPENDENT_AMBULATORY_CARE_PROVIDER_SITE_OTHER): Admitting: Vascular Surgery

## 2025-02-25 ENCOUNTER — Encounter: Admitting: Family Medicine
